# Patient Record
Sex: Female | Born: 1935 | Race: White | Hispanic: No | Marital: Married | Smoking: Current every day smoker
Health system: Southern US, Community
[De-identification: ages and names within clinical notes are randomized; demographics above are authoritative.]

## PROBLEM LIST (undated history)

## (undated) DIAGNOSIS — R7301 Impaired fasting glucose: Secondary | ICD-10-CM

## (undated) DIAGNOSIS — I10 Essential (primary) hypertension: Secondary | ICD-10-CM

## (undated) DIAGNOSIS — Z853 Personal history of malignant neoplasm of breast: Secondary | ICD-10-CM

## (undated) DIAGNOSIS — M810 Age-related osteoporosis without current pathological fracture: Secondary | ICD-10-CM

## (undated) DIAGNOSIS — E782 Mixed hyperlipidemia: Secondary | ICD-10-CM

## (undated) DIAGNOSIS — I6523 Occlusion and stenosis of bilateral carotid arteries: Secondary | ICD-10-CM

## (undated) HISTORY — PX: OTHER SURGICAL HISTORY: SHX169

## (undated) HISTORY — DX: Age-related osteoporosis without current pathological fracture: M81.0

## (undated) HISTORY — DX: Personal history of malignant neoplasm of breast: Z85.3

## (undated) HISTORY — DX: Impaired fasting glucose: R73.01

## (undated) HISTORY — DX: Essential (primary) hypertension: I10

## (undated) HISTORY — DX: Mixed hyperlipidemia: E78.2

## (undated) HISTORY — PX: FEMORAL BYPASS: SHX50

## (undated) HISTORY — DX: Occlusion and stenosis of bilateral carotid arteries: I65.23

## (undated) HISTORY — PX: CAROTID ENDARTERECTOMY: SUR193

## (undated) HISTORY — PX: FEMORAL ARTERY STENT: SHX1583

---

## 2008-04-13 HISTORY — PX: BREAST LUMPECTOMY: SHX2

## 2011-04-16 DIAGNOSIS — J209 Acute bronchitis, unspecified: Secondary | ICD-10-CM | POA: Diagnosis not present

## 2011-05-14 DIAGNOSIS — M899 Disorder of bone, unspecified: Secondary | ICD-10-CM | POA: Diagnosis not present

## 2011-05-14 DIAGNOSIS — Z09 Encounter for follow-up examination after completed treatment for conditions other than malignant neoplasm: Secondary | ICD-10-CM | POA: Diagnosis not present

## 2011-05-14 DIAGNOSIS — Z853 Personal history of malignant neoplasm of breast: Secondary | ICD-10-CM | POA: Diagnosis not present

## 2011-05-21 DIAGNOSIS — R300 Dysuria: Secondary | ICD-10-CM | POA: Diagnosis not present

## 2011-05-21 DIAGNOSIS — B373 Candidiasis of vulva and vagina: Secondary | ICD-10-CM | POA: Diagnosis not present

## 2011-06-08 DIAGNOSIS — C50319 Malignant neoplasm of lower-inner quadrant of unspecified female breast: Secondary | ICD-10-CM | POA: Diagnosis not present

## 2011-07-22 DIAGNOSIS — Z79899 Other long term (current) drug therapy: Secondary | ICD-10-CM | POA: Diagnosis not present

## 2011-07-22 DIAGNOSIS — I10 Essential (primary) hypertension: Secondary | ICD-10-CM | POA: Diagnosis not present

## 2011-07-22 DIAGNOSIS — E7800 Pure hypercholesterolemia, unspecified: Secondary | ICD-10-CM | POA: Diagnosis not present

## 2011-07-22 DIAGNOSIS — R7301 Impaired fasting glucose: Secondary | ICD-10-CM | POA: Diagnosis not present

## 2011-07-22 DIAGNOSIS — E782 Mixed hyperlipidemia: Secondary | ICD-10-CM | POA: Diagnosis not present

## 2011-07-28 DIAGNOSIS — L820 Inflamed seborrheic keratosis: Secondary | ICD-10-CM | POA: Diagnosis not present

## 2011-10-05 DIAGNOSIS — H40019 Open angle with borderline findings, low risk, unspecified eye: Secondary | ICD-10-CM | POA: Diagnosis not present

## 2011-10-05 DIAGNOSIS — H353190 Nonexudative age-related macular degeneration, unspecified eye, stage unspecified: Secondary | ICD-10-CM | POA: Diagnosis not present

## 2011-10-20 DIAGNOSIS — H538 Other visual disturbances: Secondary | ICD-10-CM | POA: Diagnosis not present

## 2011-10-20 DIAGNOSIS — H35059 Retinal neovascularization, unspecified, unspecified eye: Secondary | ICD-10-CM | POA: Diagnosis not present

## 2011-10-23 DIAGNOSIS — H353290 Exudative age-related macular degeneration, unspecified eye, stage unspecified: Secondary | ICD-10-CM | POA: Diagnosis not present

## 2011-11-03 DIAGNOSIS — Z79899 Other long term (current) drug therapy: Secondary | ICD-10-CM | POA: Diagnosis not present

## 2011-11-03 DIAGNOSIS — E782 Mixed hyperlipidemia: Secondary | ICD-10-CM | POA: Diagnosis not present

## 2011-11-03 DIAGNOSIS — I10 Essential (primary) hypertension: Secondary | ICD-10-CM | POA: Diagnosis not present

## 2011-11-03 DIAGNOSIS — R7301 Impaired fasting glucose: Secondary | ICD-10-CM | POA: Diagnosis not present

## 2011-11-03 DIAGNOSIS — M545 Low back pain: Secondary | ICD-10-CM | POA: Diagnosis not present

## 2011-11-24 DIAGNOSIS — I10 Essential (primary) hypertension: Secondary | ICD-10-CM | POA: Diagnosis not present

## 2011-11-24 DIAGNOSIS — H353290 Exudative age-related macular degeneration, unspecified eye, stage unspecified: Secondary | ICD-10-CM | POA: Diagnosis not present

## 2012-01-05 DIAGNOSIS — H353290 Exudative age-related macular degeneration, unspecified eye, stage unspecified: Secondary | ICD-10-CM | POA: Diagnosis not present

## 2012-01-07 DIAGNOSIS — N390 Urinary tract infection, site not specified: Secondary | ICD-10-CM | POA: Diagnosis not present

## 2012-01-07 DIAGNOSIS — N3000 Acute cystitis without hematuria: Secondary | ICD-10-CM | POA: Diagnosis not present

## 2012-01-19 DIAGNOSIS — R21 Rash and other nonspecific skin eruption: Secondary | ICD-10-CM | POA: Diagnosis not present

## 2012-02-01 DIAGNOSIS — M545 Low back pain: Secondary | ICD-10-CM | POA: Diagnosis not present

## 2012-02-01 DIAGNOSIS — R7301 Impaired fasting glucose: Secondary | ICD-10-CM | POA: Diagnosis not present

## 2012-02-01 DIAGNOSIS — E782 Mixed hyperlipidemia: Secondary | ICD-10-CM | POA: Diagnosis not present

## 2012-02-01 DIAGNOSIS — Z23 Encounter for immunization: Secondary | ICD-10-CM | POA: Diagnosis not present

## 2012-02-01 DIAGNOSIS — I10 Essential (primary) hypertension: Secondary | ICD-10-CM | POA: Diagnosis not present

## 2012-02-09 DIAGNOSIS — H353290 Exudative age-related macular degeneration, unspecified eye, stage unspecified: Secondary | ICD-10-CM | POA: Diagnosis not present

## 2012-03-08 DIAGNOSIS — H353290 Exudative age-related macular degeneration, unspecified eye, stage unspecified: Secondary | ICD-10-CM | POA: Diagnosis not present

## 2012-04-01 DIAGNOSIS — N390 Urinary tract infection, site not specified: Secondary | ICD-10-CM | POA: Diagnosis not present

## 2012-04-01 DIAGNOSIS — N3000 Acute cystitis without hematuria: Secondary | ICD-10-CM | POA: Diagnosis not present

## 2012-04-08 DIAGNOSIS — R21 Rash and other nonspecific skin eruption: Secondary | ICD-10-CM | POA: Diagnosis not present

## 2012-04-11 DIAGNOSIS — R21 Rash and other nonspecific skin eruption: Secondary | ICD-10-CM | POA: Diagnosis not present

## 2012-04-12 DIAGNOSIS — H353290 Exudative age-related macular degeneration, unspecified eye, stage unspecified: Secondary | ICD-10-CM | POA: Diagnosis not present

## 2012-05-24 DIAGNOSIS — H353290 Exudative age-related macular degeneration, unspecified eye, stage unspecified: Secondary | ICD-10-CM | POA: Diagnosis not present

## 2012-05-24 DIAGNOSIS — N3000 Acute cystitis without hematuria: Secondary | ICD-10-CM | POA: Diagnosis not present

## 2012-06-13 DIAGNOSIS — C50919 Malignant neoplasm of unspecified site of unspecified female breast: Secondary | ICD-10-CM | POA: Diagnosis not present

## 2012-06-13 DIAGNOSIS — Z9889 Other specified postprocedural states: Secondary | ICD-10-CM | POA: Diagnosis not present

## 2012-06-15 DIAGNOSIS — Z09 Encounter for follow-up examination after completed treatment for conditions other than malignant neoplasm: Secondary | ICD-10-CM | POA: Diagnosis not present

## 2012-06-15 DIAGNOSIS — M899 Disorder of bone, unspecified: Secondary | ICD-10-CM | POA: Diagnosis not present

## 2012-06-15 DIAGNOSIS — Z853 Personal history of malignant neoplasm of breast: Secondary | ICD-10-CM | POA: Diagnosis not present

## 2012-06-21 DIAGNOSIS — L299 Pruritus, unspecified: Secondary | ICD-10-CM | POA: Diagnosis not present

## 2012-06-21 DIAGNOSIS — N3000 Acute cystitis without hematuria: Secondary | ICD-10-CM | POA: Diagnosis not present

## 2012-07-05 DIAGNOSIS — H353290 Exudative age-related macular degeneration, unspecified eye, stage unspecified: Secondary | ICD-10-CM | POA: Diagnosis not present

## 2012-08-03 DIAGNOSIS — H353290 Exudative age-related macular degeneration, unspecified eye, stage unspecified: Secondary | ICD-10-CM | POA: Diagnosis not present

## 2012-08-30 DIAGNOSIS — M25579 Pain in unspecified ankle and joints of unspecified foot: Secondary | ICD-10-CM | POA: Diagnosis not present

## 2012-08-30 DIAGNOSIS — M159 Polyosteoarthritis, unspecified: Secondary | ICD-10-CM | POA: Diagnosis not present

## 2012-09-06 DIAGNOSIS — H353290 Exudative age-related macular degeneration, unspecified eye, stage unspecified: Secondary | ICD-10-CM | POA: Diagnosis not present

## 2012-09-19 DIAGNOSIS — M5030 Other cervical disc degeneration, unspecified cervical region: Secondary | ICD-10-CM | POA: Diagnosis not present

## 2012-09-19 DIAGNOSIS — M999 Biomechanical lesion, unspecified: Secondary | ICD-10-CM | POA: Diagnosis not present

## 2012-09-19 DIAGNOSIS — M5137 Other intervertebral disc degeneration, lumbosacral region: Secondary | ICD-10-CM | POA: Diagnosis not present

## 2012-09-21 DIAGNOSIS — M5030 Other cervical disc degeneration, unspecified cervical region: Secondary | ICD-10-CM | POA: Diagnosis not present

## 2012-09-21 DIAGNOSIS — M5137 Other intervertebral disc degeneration, lumbosacral region: Secondary | ICD-10-CM | POA: Diagnosis not present

## 2012-09-21 DIAGNOSIS — M999 Biomechanical lesion, unspecified: Secondary | ICD-10-CM | POA: Diagnosis not present

## 2012-09-23 DIAGNOSIS — M5030 Other cervical disc degeneration, unspecified cervical region: Secondary | ICD-10-CM | POA: Diagnosis not present

## 2012-09-23 DIAGNOSIS — M5137 Other intervertebral disc degeneration, lumbosacral region: Secondary | ICD-10-CM | POA: Diagnosis not present

## 2012-09-23 DIAGNOSIS — M999 Biomechanical lesion, unspecified: Secondary | ICD-10-CM | POA: Diagnosis not present

## 2012-09-26 DIAGNOSIS — M5030 Other cervical disc degeneration, unspecified cervical region: Secondary | ICD-10-CM | POA: Diagnosis not present

## 2012-09-26 DIAGNOSIS — M5137 Other intervertebral disc degeneration, lumbosacral region: Secondary | ICD-10-CM | POA: Diagnosis not present

## 2012-09-26 DIAGNOSIS — M999 Biomechanical lesion, unspecified: Secondary | ICD-10-CM | POA: Diagnosis not present

## 2012-09-29 DIAGNOSIS — M5137 Other intervertebral disc degeneration, lumbosacral region: Secondary | ICD-10-CM | POA: Diagnosis not present

## 2012-09-29 DIAGNOSIS — M999 Biomechanical lesion, unspecified: Secondary | ICD-10-CM | POA: Diagnosis not present

## 2012-09-29 DIAGNOSIS — M5030 Other cervical disc degeneration, unspecified cervical region: Secondary | ICD-10-CM | POA: Diagnosis not present

## 2012-10-03 DIAGNOSIS — M999 Biomechanical lesion, unspecified: Secondary | ICD-10-CM | POA: Diagnosis not present

## 2012-10-03 DIAGNOSIS — M5030 Other cervical disc degeneration, unspecified cervical region: Secondary | ICD-10-CM | POA: Diagnosis not present

## 2012-10-03 DIAGNOSIS — M5137 Other intervertebral disc degeneration, lumbosacral region: Secondary | ICD-10-CM | POA: Diagnosis not present

## 2012-10-04 DIAGNOSIS — H353290 Exudative age-related macular degeneration, unspecified eye, stage unspecified: Secondary | ICD-10-CM | POA: Diagnosis not present

## 2012-10-06 DIAGNOSIS — M5030 Other cervical disc degeneration, unspecified cervical region: Secondary | ICD-10-CM | POA: Diagnosis not present

## 2012-10-06 DIAGNOSIS — M999 Biomechanical lesion, unspecified: Secondary | ICD-10-CM | POA: Diagnosis not present

## 2012-10-06 DIAGNOSIS — M5137 Other intervertebral disc degeneration, lumbosacral region: Secondary | ICD-10-CM | POA: Diagnosis not present

## 2012-10-10 DIAGNOSIS — M5030 Other cervical disc degeneration, unspecified cervical region: Secondary | ICD-10-CM | POA: Diagnosis not present

## 2012-10-10 DIAGNOSIS — M999 Biomechanical lesion, unspecified: Secondary | ICD-10-CM | POA: Diagnosis not present

## 2012-10-10 DIAGNOSIS — M5137 Other intervertebral disc degeneration, lumbosacral region: Secondary | ICD-10-CM | POA: Diagnosis not present

## 2012-10-13 DIAGNOSIS — M5137 Other intervertebral disc degeneration, lumbosacral region: Secondary | ICD-10-CM | POA: Diagnosis not present

## 2012-10-13 DIAGNOSIS — M999 Biomechanical lesion, unspecified: Secondary | ICD-10-CM | POA: Diagnosis not present

## 2012-10-13 DIAGNOSIS — M5030 Other cervical disc degeneration, unspecified cervical region: Secondary | ICD-10-CM | POA: Diagnosis not present

## 2012-10-20 DIAGNOSIS — M5030 Other cervical disc degeneration, unspecified cervical region: Secondary | ICD-10-CM | POA: Diagnosis not present

## 2012-10-20 DIAGNOSIS — M5137 Other intervertebral disc degeneration, lumbosacral region: Secondary | ICD-10-CM | POA: Diagnosis not present

## 2012-10-20 DIAGNOSIS — M999 Biomechanical lesion, unspecified: Secondary | ICD-10-CM | POA: Diagnosis not present

## 2012-10-25 DIAGNOSIS — H353290 Exudative age-related macular degeneration, unspecified eye, stage unspecified: Secondary | ICD-10-CM | POA: Diagnosis not present

## 2012-11-18 DIAGNOSIS — N3000 Acute cystitis without hematuria: Secondary | ICD-10-CM | POA: Diagnosis not present

## 2012-12-06 DIAGNOSIS — H353290 Exudative age-related macular degeneration, unspecified eye, stage unspecified: Secondary | ICD-10-CM | POA: Diagnosis not present

## 2012-12-16 DIAGNOSIS — H353290 Exudative age-related macular degeneration, unspecified eye, stage unspecified: Secondary | ICD-10-CM | POA: Diagnosis not present

## 2013-01-02 DIAGNOSIS — H353190 Nonexudative age-related macular degeneration, unspecified eye, stage unspecified: Secondary | ICD-10-CM | POA: Diagnosis not present

## 2013-01-03 DIAGNOSIS — H353290 Exudative age-related macular degeneration, unspecified eye, stage unspecified: Secondary | ICD-10-CM | POA: Diagnosis not present

## 2013-01-24 DIAGNOSIS — H353290 Exudative age-related macular degeneration, unspecified eye, stage unspecified: Secondary | ICD-10-CM | POA: Diagnosis not present

## 2013-01-24 DIAGNOSIS — Z23 Encounter for immunization: Secondary | ICD-10-CM | POA: Diagnosis not present

## 2013-02-21 DIAGNOSIS — H353190 Nonexudative age-related macular degeneration, unspecified eye, stage unspecified: Secondary | ICD-10-CM | POA: Diagnosis not present

## 2013-02-21 DIAGNOSIS — H353290 Exudative age-related macular degeneration, unspecified eye, stage unspecified: Secondary | ICD-10-CM | POA: Diagnosis not present

## 2013-03-27 DIAGNOSIS — H353290 Exudative age-related macular degeneration, unspecified eye, stage unspecified: Secondary | ICD-10-CM | POA: Diagnosis not present

## 2013-03-31 DIAGNOSIS — R7309 Other abnormal glucose: Secondary | ICD-10-CM | POA: Diagnosis not present

## 2013-03-31 DIAGNOSIS — I10 Essential (primary) hypertension: Secondary | ICD-10-CM | POA: Diagnosis not present

## 2013-03-31 DIAGNOSIS — E782 Mixed hyperlipidemia: Secondary | ICD-10-CM | POA: Diagnosis not present

## 2013-03-31 DIAGNOSIS — R7301 Impaired fasting glucose: Secondary | ICD-10-CM | POA: Diagnosis not present

## 2013-04-18 DIAGNOSIS — R42 Dizziness and giddiness: Secondary | ICD-10-CM | POA: Diagnosis not present

## 2013-04-18 DIAGNOSIS — E782 Mixed hyperlipidemia: Secondary | ICD-10-CM | POA: Diagnosis not present

## 2013-04-18 DIAGNOSIS — IMO0001 Reserved for inherently not codable concepts without codable children: Secondary | ICD-10-CM | POA: Diagnosis not present

## 2013-04-18 DIAGNOSIS — I10 Essential (primary) hypertension: Secondary | ICD-10-CM | POA: Diagnosis not present

## 2013-04-18 DIAGNOSIS — R0989 Other specified symptoms and signs involving the circulatory and respiratory systems: Secondary | ICD-10-CM | POA: Diagnosis not present

## 2013-04-18 DIAGNOSIS — I739 Peripheral vascular disease, unspecified: Secondary | ICD-10-CM | POA: Diagnosis not present

## 2013-04-20 DIAGNOSIS — I7092 Chronic total occlusion of artery of the extremities: Secondary | ICD-10-CM | POA: Diagnosis not present

## 2013-04-20 DIAGNOSIS — R42 Dizziness and giddiness: Secondary | ICD-10-CM | POA: Diagnosis not present

## 2013-04-20 DIAGNOSIS — I658 Occlusion and stenosis of other precerebral arteries: Secondary | ICD-10-CM | POA: Diagnosis not present

## 2013-04-20 DIAGNOSIS — I6529 Occlusion and stenosis of unspecified carotid artery: Secondary | ICD-10-CM | POA: Diagnosis not present

## 2013-04-20 DIAGNOSIS — I77811 Abdominal aortic ectasia: Secondary | ICD-10-CM | POA: Diagnosis not present

## 2013-04-20 DIAGNOSIS — I739 Peripheral vascular disease, unspecified: Secondary | ICD-10-CM | POA: Diagnosis not present

## 2013-04-24 DIAGNOSIS — I70219 Atherosclerosis of native arteries of extremities with intermittent claudication, unspecified extremity: Secondary | ICD-10-CM | POA: Diagnosis not present

## 2013-04-24 DIAGNOSIS — I6529 Occlusion and stenosis of unspecified carotid artery: Secondary | ICD-10-CM | POA: Diagnosis not present

## 2013-04-24 DIAGNOSIS — I658 Occlusion and stenosis of other precerebral arteries: Secondary | ICD-10-CM | POA: Diagnosis not present

## 2013-04-26 DIAGNOSIS — I70219 Atherosclerosis of native arteries of extremities with intermittent claudication, unspecified extremity: Secondary | ICD-10-CM | POA: Diagnosis not present

## 2013-04-27 DIAGNOSIS — I6529 Occlusion and stenosis of unspecified carotid artery: Secondary | ICD-10-CM | POA: Diagnosis not present

## 2013-04-27 DIAGNOSIS — M79609 Pain in unspecified limb: Secondary | ICD-10-CM | POA: Diagnosis not present

## 2013-04-27 DIAGNOSIS — I70219 Atherosclerosis of native arteries of extremities with intermittent claudication, unspecified extremity: Secondary | ICD-10-CM | POA: Diagnosis not present

## 2013-05-09 DIAGNOSIS — H353290 Exudative age-related macular degeneration, unspecified eye, stage unspecified: Secondary | ICD-10-CM | POA: Diagnosis not present

## 2013-05-11 DIAGNOSIS — R51 Headache: Secondary | ICD-10-CM | POA: Diagnosis not present

## 2013-05-11 DIAGNOSIS — I70219 Atherosclerosis of native arteries of extremities with intermittent claudication, unspecified extremity: Secondary | ICD-10-CM | POA: Diagnosis not present

## 2013-05-11 DIAGNOSIS — R42 Dizziness and giddiness: Secondary | ICD-10-CM | POA: Diagnosis not present

## 2013-05-11 DIAGNOSIS — E782 Mixed hyperlipidemia: Secondary | ICD-10-CM | POA: Diagnosis not present

## 2013-05-11 DIAGNOSIS — R0989 Other specified symptoms and signs involving the circulatory and respiratory systems: Secondary | ICD-10-CM | POA: Diagnosis not present

## 2013-05-18 DIAGNOSIS — Z1211 Encounter for screening for malignant neoplasm of colon: Secondary | ICD-10-CM | POA: Diagnosis not present

## 2013-05-18 DIAGNOSIS — N390 Urinary tract infection, site not specified: Secondary | ICD-10-CM | POA: Diagnosis not present

## 2013-05-18 DIAGNOSIS — Z0000 Encounter for general adult medical examination without abnormal findings: Secondary | ICD-10-CM | POA: Diagnosis not present

## 2013-05-18 DIAGNOSIS — F17200 Nicotine dependence, unspecified, uncomplicated: Secondary | ICD-10-CM | POA: Diagnosis not present

## 2013-05-19 DIAGNOSIS — R42 Dizziness and giddiness: Secondary | ICD-10-CM | POA: Diagnosis not present

## 2013-05-19 DIAGNOSIS — R51 Headache: Secondary | ICD-10-CM | POA: Diagnosis not present

## 2013-05-19 DIAGNOSIS — R930 Abnormal findings on diagnostic imaging of skull and head, not elsewhere classified: Secondary | ICD-10-CM | POA: Diagnosis not present

## 2013-05-23 DIAGNOSIS — Z1211 Encounter for screening for malignant neoplasm of colon: Secondary | ICD-10-CM | POA: Diagnosis not present

## 2013-05-24 DIAGNOSIS — Z13820 Encounter for screening for osteoporosis: Secondary | ICD-10-CM | POA: Diagnosis not present

## 2013-05-24 DIAGNOSIS — M810 Age-related osteoporosis without current pathological fracture: Secondary | ICD-10-CM | POA: Diagnosis not present

## 2013-05-25 DIAGNOSIS — I6529 Occlusion and stenosis of unspecified carotid artery: Secondary | ICD-10-CM | POA: Diagnosis not present

## 2013-05-25 DIAGNOSIS — I658 Occlusion and stenosis of other precerebral arteries: Secondary | ICD-10-CM | POA: Diagnosis not present

## 2013-06-20 DIAGNOSIS — H353290 Exudative age-related macular degeneration, unspecified eye, stage unspecified: Secondary | ICD-10-CM | POA: Diagnosis not present

## 2013-06-29 DIAGNOSIS — M545 Low back pain, unspecified: Secondary | ICD-10-CM | POA: Diagnosis not present

## 2013-07-25 DIAGNOSIS — H353290 Exudative age-related macular degeneration, unspecified eye, stage unspecified: Secondary | ICD-10-CM | POA: Diagnosis not present

## 2013-08-14 DIAGNOSIS — Z9889 Other specified postprocedural states: Secondary | ICD-10-CM | POA: Diagnosis not present

## 2013-08-14 DIAGNOSIS — N641 Fat necrosis of breast: Secondary | ICD-10-CM | POA: Diagnosis not present

## 2013-08-14 DIAGNOSIS — R922 Inconclusive mammogram: Secondary | ICD-10-CM | POA: Diagnosis not present

## 2013-08-14 DIAGNOSIS — C50319 Malignant neoplasm of lower-inner quadrant of unspecified female breast: Secondary | ICD-10-CM | POA: Diagnosis not present

## 2013-08-17 DIAGNOSIS — N63 Unspecified lump in unspecified breast: Secondary | ICD-10-CM | POA: Diagnosis not present

## 2013-08-17 DIAGNOSIS — Z09 Encounter for follow-up examination after completed treatment for conditions other than malignant neoplasm: Secondary | ICD-10-CM | POA: Diagnosis not present

## 2013-08-17 DIAGNOSIS — Z853 Personal history of malignant neoplasm of breast: Secondary | ICD-10-CM | POA: Diagnosis not present

## 2013-08-17 DIAGNOSIS — M899 Disorder of bone, unspecified: Secondary | ICD-10-CM | POA: Diagnosis not present

## 2013-08-17 DIAGNOSIS — M949 Disorder of cartilage, unspecified: Secondary | ICD-10-CM | POA: Diagnosis not present

## 2013-09-05 DIAGNOSIS — H353290 Exudative age-related macular degeneration, unspecified eye, stage unspecified: Secondary | ICD-10-CM | POA: Diagnosis not present

## 2013-10-02 DIAGNOSIS — R42 Dizziness and giddiness: Secondary | ICD-10-CM | POA: Diagnosis not present

## 2013-10-02 DIAGNOSIS — E782 Mixed hyperlipidemia: Secondary | ICD-10-CM | POA: Diagnosis not present

## 2013-10-02 DIAGNOSIS — R7301 Impaired fasting glucose: Secondary | ICD-10-CM | POA: Diagnosis not present

## 2013-10-02 DIAGNOSIS — R7309 Other abnormal glucose: Secondary | ICD-10-CM | POA: Diagnosis not present

## 2013-10-02 DIAGNOSIS — I10 Essential (primary) hypertension: Secondary | ICD-10-CM | POA: Diagnosis not present

## 2013-10-12 DIAGNOSIS — H353290 Exudative age-related macular degeneration, unspecified eye, stage unspecified: Secondary | ICD-10-CM | POA: Diagnosis not present

## 2013-10-12 DIAGNOSIS — J342 Deviated nasal septum: Secondary | ICD-10-CM | POA: Diagnosis not present

## 2013-10-12 DIAGNOSIS — R42 Dizziness and giddiness: Secondary | ICD-10-CM | POA: Diagnosis not present

## 2013-10-12 DIAGNOSIS — R269 Unspecified abnormalities of gait and mobility: Secondary | ICD-10-CM | POA: Diagnosis not present

## 2013-11-07 DIAGNOSIS — I70219 Atherosclerosis of native arteries of extremities with intermittent claudication, unspecified extremity: Secondary | ICD-10-CM | POA: Diagnosis not present

## 2013-11-07 DIAGNOSIS — I658 Occlusion and stenosis of other precerebral arteries: Secondary | ICD-10-CM | POA: Diagnosis not present

## 2013-11-07 DIAGNOSIS — I739 Peripheral vascular disease, unspecified: Secondary | ICD-10-CM | POA: Diagnosis not present

## 2013-11-07 DIAGNOSIS — I6529 Occlusion and stenosis of unspecified carotid artery: Secondary | ICD-10-CM | POA: Diagnosis not present

## 2013-11-09 DIAGNOSIS — J342 Deviated nasal septum: Secondary | ICD-10-CM | POA: Diagnosis not present

## 2013-11-09 DIAGNOSIS — Z0110 Encounter for examination of ears and hearing without abnormal findings: Secondary | ICD-10-CM | POA: Diagnosis not present

## 2013-11-09 DIAGNOSIS — H8110 Benign paroxysmal vertigo, unspecified ear: Secondary | ICD-10-CM | POA: Diagnosis not present

## 2013-11-21 DIAGNOSIS — H8110 Benign paroxysmal vertigo, unspecified ear: Secondary | ICD-10-CM | POA: Diagnosis not present

## 2013-11-23 DIAGNOSIS — H353290 Exudative age-related macular degeneration, unspecified eye, stage unspecified: Secondary | ICD-10-CM | POA: Diagnosis not present

## 2013-12-28 DIAGNOSIS — H353290 Exudative age-related macular degeneration, unspecified eye, stage unspecified: Secondary | ICD-10-CM | POA: Diagnosis not present

## 2014-01-23 DIAGNOSIS — Z23 Encounter for immunization: Secondary | ICD-10-CM | POA: Diagnosis not present

## 2014-01-25 DIAGNOSIS — H3532 Exudative age-related macular degeneration: Secondary | ICD-10-CM | POA: Diagnosis not present

## 2014-02-15 DIAGNOSIS — N63 Unspecified lump in breast: Secondary | ICD-10-CM | POA: Diagnosis not present

## 2014-02-15 DIAGNOSIS — R922 Inconclusive mammogram: Secondary | ICD-10-CM | POA: Diagnosis not present

## 2014-02-22 DIAGNOSIS — Z0000 Encounter for general adult medical examination without abnormal findings: Secondary | ICD-10-CM | POA: Diagnosis not present

## 2014-03-01 DIAGNOSIS — N183 Chronic kidney disease, stage 3 (moderate): Secondary | ICD-10-CM | POA: Diagnosis not present

## 2014-03-01 DIAGNOSIS — I6529 Occlusion and stenosis of unspecified carotid artery: Secondary | ICD-10-CM | POA: Diagnosis not present

## 2014-03-01 DIAGNOSIS — Z01818 Encounter for other preprocedural examination: Secondary | ICD-10-CM | POA: Diagnosis not present

## 2014-03-05 DIAGNOSIS — N3001 Acute cystitis with hematuria: Secondary | ICD-10-CM | POA: Diagnosis not present

## 2014-03-05 DIAGNOSIS — N390 Urinary tract infection, site not specified: Secondary | ICD-10-CM | POA: Diagnosis not present

## 2014-03-07 DIAGNOSIS — Z01810 Encounter for preprocedural cardiovascular examination: Secondary | ICD-10-CM | POA: Diagnosis not present

## 2014-03-07 DIAGNOSIS — I6523 Occlusion and stenosis of bilateral carotid arteries: Secondary | ICD-10-CM | POA: Diagnosis not present

## 2014-03-14 DIAGNOSIS — D72829 Elevated white blood cell count, unspecified: Secondary | ICD-10-CM | POA: Diagnosis not present

## 2014-03-14 DIAGNOSIS — E785 Hyperlipidemia, unspecified: Secondary | ICD-10-CM | POA: Diagnosis not present

## 2014-03-14 DIAGNOSIS — Z87891 Personal history of nicotine dependence: Secondary | ICD-10-CM | POA: Diagnosis not present

## 2014-03-14 DIAGNOSIS — N183 Chronic kidney disease, stage 3 (moderate): Secondary | ICD-10-CM | POA: Diagnosis not present

## 2014-03-14 DIAGNOSIS — I6522 Occlusion and stenosis of left carotid artery: Secondary | ICD-10-CM | POA: Diagnosis not present

## 2014-03-14 DIAGNOSIS — E876 Hypokalemia: Secondary | ICD-10-CM | POA: Diagnosis not present

## 2014-03-14 DIAGNOSIS — I129 Hypertensive chronic kidney disease with stage 1 through stage 4 chronic kidney disease, or unspecified chronic kidney disease: Secondary | ICD-10-CM | POA: Diagnosis not present

## 2014-03-14 DIAGNOSIS — Z01818 Encounter for other preprocedural examination: Secondary | ICD-10-CM | POA: Diagnosis not present

## 2014-03-19 DIAGNOSIS — I63239 Cerebral infarction due to unspecified occlusion or stenosis of unspecified carotid arteries: Secondary | ICD-10-CM | POA: Diagnosis not present

## 2014-03-19 DIAGNOSIS — R42 Dizziness and giddiness: Secondary | ICD-10-CM | POA: Diagnosis not present

## 2014-03-19 DIAGNOSIS — I6523 Occlusion and stenosis of bilateral carotid arteries: Secondary | ICD-10-CM | POA: Diagnosis not present

## 2014-03-19 DIAGNOSIS — Z01810 Encounter for preprocedural cardiovascular examination: Secondary | ICD-10-CM | POA: Diagnosis not present

## 2014-03-20 DIAGNOSIS — I129 Hypertensive chronic kidney disease with stage 1 through stage 4 chronic kidney disease, or unspecified chronic kidney disease: Secondary | ICD-10-CM | POA: Diagnosis present

## 2014-03-20 DIAGNOSIS — I6529 Occlusion and stenosis of unspecified carotid artery: Secondary | ICD-10-CM | POA: Diagnosis not present

## 2014-03-20 DIAGNOSIS — I6522 Occlusion and stenosis of left carotid artery: Secondary | ICD-10-CM | POA: Diagnosis not present

## 2014-03-20 DIAGNOSIS — Z87891 Personal history of nicotine dependence: Secondary | ICD-10-CM | POA: Diagnosis not present

## 2014-03-20 DIAGNOSIS — D72829 Elevated white blood cell count, unspecified: Secondary | ICD-10-CM | POA: Diagnosis present

## 2014-03-20 DIAGNOSIS — J90 Pleural effusion, not elsewhere classified: Secondary | ICD-10-CM | POA: Diagnosis not present

## 2014-03-20 DIAGNOSIS — I659 Occlusion and stenosis of unspecified precerebral artery: Secondary | ICD-10-CM | POA: Diagnosis not present

## 2014-03-20 DIAGNOSIS — N183 Chronic kidney disease, stage 3 (moderate): Secondary | ICD-10-CM | POA: Diagnosis present

## 2014-03-20 DIAGNOSIS — E785 Hyperlipidemia, unspecified: Secondary | ICD-10-CM | POA: Diagnosis present

## 2014-03-20 DIAGNOSIS — E876 Hypokalemia: Secondary | ICD-10-CM | POA: Diagnosis present

## 2014-03-30 DIAGNOSIS — H3532 Exudative age-related macular degeneration: Secondary | ICD-10-CM | POA: Diagnosis not present

## 2014-04-04 DIAGNOSIS — I6522 Occlusion and stenosis of left carotid artery: Secondary | ICD-10-CM | POA: Diagnosis not present

## 2014-04-04 DIAGNOSIS — I10 Essential (primary) hypertension: Secondary | ICD-10-CM | POA: Diagnosis not present

## 2014-04-10 DIAGNOSIS — R7301 Impaired fasting glucose: Secondary | ICD-10-CM | POA: Diagnosis not present

## 2014-04-10 DIAGNOSIS — E782 Mixed hyperlipidemia: Secondary | ICD-10-CM | POA: Diagnosis not present

## 2014-04-10 DIAGNOSIS — E780 Pure hypercholesterolemia: Secondary | ICD-10-CM | POA: Diagnosis not present

## 2014-04-10 DIAGNOSIS — I10 Essential (primary) hypertension: Secondary | ICD-10-CM | POA: Diagnosis not present

## 2014-04-10 DIAGNOSIS — I6522 Occlusion and stenosis of left carotid artery: Secondary | ICD-10-CM | POA: Diagnosis not present

## 2014-04-13 HISTORY — PX: CATARACT EXTRACTION: SUR2

## 2014-04-27 DIAGNOSIS — I6522 Occlusion and stenosis of left carotid artery: Secondary | ICD-10-CM | POA: Diagnosis not present

## 2014-04-27 DIAGNOSIS — I10 Essential (primary) hypertension: Secondary | ICD-10-CM | POA: Diagnosis not present

## 2014-04-27 DIAGNOSIS — Z4802 Encounter for removal of sutures: Secondary | ICD-10-CM | POA: Diagnosis not present

## 2014-05-10 DIAGNOSIS — I6523 Occlusion and stenosis of bilateral carotid arteries: Secondary | ICD-10-CM | POA: Diagnosis not present

## 2014-05-17 DIAGNOSIS — H3532 Exudative age-related macular degeneration: Secondary | ICD-10-CM | POA: Diagnosis not present

## 2014-06-28 DIAGNOSIS — H3532 Exudative age-related macular degeneration: Secondary | ICD-10-CM | POA: Diagnosis not present

## 2014-07-17 DIAGNOSIS — H40013 Open angle with borderline findings, low risk, bilateral: Secondary | ICD-10-CM | POA: Diagnosis not present

## 2014-07-17 DIAGNOSIS — H25811 Combined forms of age-related cataract, right eye: Secondary | ICD-10-CM | POA: Diagnosis not present

## 2014-07-23 DIAGNOSIS — H2511 Age-related nuclear cataract, right eye: Secondary | ICD-10-CM | POA: Diagnosis not present

## 2014-07-23 DIAGNOSIS — H25811 Combined forms of age-related cataract, right eye: Secondary | ICD-10-CM | POA: Diagnosis not present

## 2014-08-16 DIAGNOSIS — H3532 Exudative age-related macular degeneration: Secondary | ICD-10-CM | POA: Diagnosis not present

## 2014-08-20 DIAGNOSIS — N63 Unspecified lump in breast: Secondary | ICD-10-CM | POA: Diagnosis not present

## 2014-08-20 DIAGNOSIS — R928 Other abnormal and inconclusive findings on diagnostic imaging of breast: Secondary | ICD-10-CM | POA: Diagnosis not present

## 2014-08-24 DIAGNOSIS — E876 Hypokalemia: Secondary | ICD-10-CM | POA: Diagnosis not present

## 2014-08-24 DIAGNOSIS — Z853 Personal history of malignant neoplasm of breast: Secondary | ICD-10-CM | POA: Diagnosis not present

## 2014-08-24 DIAGNOSIS — M8589 Other specified disorders of bone density and structure, multiple sites: Secondary | ICD-10-CM | POA: Diagnosis not present

## 2014-09-20 DIAGNOSIS — H3532 Exudative age-related macular degeneration: Secondary | ICD-10-CM | POA: Diagnosis not present

## 2014-10-22 DIAGNOSIS — Z23 Encounter for immunization: Secondary | ICD-10-CM | POA: Diagnosis not present

## 2014-10-22 DIAGNOSIS — D171 Benign lipomatous neoplasm of skin and subcutaneous tissue of trunk: Secondary | ICD-10-CM | POA: Diagnosis not present

## 2014-10-22 DIAGNOSIS — R7301 Impaired fasting glucose: Secondary | ICD-10-CM | POA: Diagnosis not present

## 2014-10-22 DIAGNOSIS — I6522 Occlusion and stenosis of left carotid artery: Secondary | ICD-10-CM | POA: Diagnosis not present

## 2014-10-22 DIAGNOSIS — M818 Other osteoporosis without current pathological fracture: Secondary | ICD-10-CM | POA: Diagnosis not present

## 2014-10-22 DIAGNOSIS — I10 Essential (primary) hypertension: Secondary | ICD-10-CM | POA: Diagnosis not present

## 2014-10-22 DIAGNOSIS — E782 Mixed hyperlipidemia: Secondary | ICD-10-CM | POA: Diagnosis not present

## 2014-10-22 DIAGNOSIS — F17210 Nicotine dependence, cigarettes, uncomplicated: Secondary | ICD-10-CM | POA: Diagnosis not present

## 2014-11-06 DIAGNOSIS — H3532 Exudative age-related macular degeneration: Secondary | ICD-10-CM | POA: Diagnosis not present

## 2014-11-13 DIAGNOSIS — H3532 Exudative age-related macular degeneration: Secondary | ICD-10-CM | POA: Diagnosis not present

## 2014-11-22 DIAGNOSIS — M810 Age-related osteoporosis without current pathological fracture: Secondary | ICD-10-CM | POA: Diagnosis not present

## 2014-11-22 DIAGNOSIS — Z0000 Encounter for general adult medical examination without abnormal findings: Secondary | ICD-10-CM | POA: Diagnosis not present

## 2014-11-22 DIAGNOSIS — F17210 Nicotine dependence, cigarettes, uncomplicated: Secondary | ICD-10-CM | POA: Diagnosis not present

## 2014-11-22 DIAGNOSIS — Z1211 Encounter for screening for malignant neoplasm of colon: Secondary | ICD-10-CM | POA: Diagnosis not present

## 2014-11-22 DIAGNOSIS — Z6820 Body mass index (BMI) 20.0-20.9, adult: Secondary | ICD-10-CM | POA: Diagnosis not present

## 2014-12-04 DIAGNOSIS — Z1211 Encounter for screening for malignant neoplasm of colon: Secondary | ICD-10-CM | POA: Diagnosis not present

## 2014-12-20 DIAGNOSIS — H3532 Exudative age-related macular degeneration: Secondary | ICD-10-CM | POA: Diagnosis not present

## 2015-02-01 DIAGNOSIS — I6523 Occlusion and stenosis of bilateral carotid arteries: Secondary | ICD-10-CM | POA: Diagnosis not present

## 2015-02-01 DIAGNOSIS — I70211 Atherosclerosis of native arteries of extremities with intermittent claudication, right leg: Secondary | ICD-10-CM | POA: Diagnosis not present

## 2015-02-01 DIAGNOSIS — I70202 Unspecified atherosclerosis of native arteries of extremities, left leg: Secondary | ICD-10-CM | POA: Diagnosis not present

## 2015-02-07 DIAGNOSIS — H353231 Exudative age-related macular degeneration, bilateral, with active choroidal neovascularization: Secondary | ICD-10-CM | POA: Diagnosis not present

## 2015-02-11 DIAGNOSIS — I6523 Occlusion and stenosis of bilateral carotid arteries: Secondary | ICD-10-CM | POA: Diagnosis not present

## 2015-02-11 DIAGNOSIS — I70213 Atherosclerosis of native arteries of extremities with intermittent claudication, bilateral legs: Secondary | ICD-10-CM | POA: Diagnosis not present

## 2015-02-12 DIAGNOSIS — Z23 Encounter for immunization: Secondary | ICD-10-CM | POA: Diagnosis not present

## 2015-02-19 DIAGNOSIS — J209 Acute bronchitis, unspecified: Secondary | ICD-10-CM | POA: Diagnosis not present

## 2015-03-01 DIAGNOSIS — J209 Acute bronchitis, unspecified: Secondary | ICD-10-CM | POA: Diagnosis not present

## 2015-03-01 DIAGNOSIS — H6123 Impacted cerumen, bilateral: Secondary | ICD-10-CM | POA: Diagnosis not present

## 2015-03-21 DIAGNOSIS — H353211 Exudative age-related macular degeneration, right eye, with active choroidal neovascularization: Secondary | ICD-10-CM | POA: Diagnosis not present

## 2015-03-21 DIAGNOSIS — H353231 Exudative age-related macular degeneration, bilateral, with active choroidal neovascularization: Secondary | ICD-10-CM | POA: Diagnosis not present

## 2015-04-01 DIAGNOSIS — N183 Chronic kidney disease, stage 3 (moderate): Secondary | ICD-10-CM | POA: Diagnosis not present

## 2015-04-01 DIAGNOSIS — I70213 Atherosclerosis of native arteries of extremities with intermittent claudication, bilateral legs: Secondary | ICD-10-CM | POA: Diagnosis not present

## 2015-04-05 DIAGNOSIS — I70219 Atherosclerosis of native arteries of extremities with intermittent claudication, unspecified extremity: Secondary | ICD-10-CM | POA: Diagnosis not present

## 2015-04-05 DIAGNOSIS — I7090 Unspecified atherosclerosis: Secondary | ICD-10-CM | POA: Insufficient documentation

## 2015-04-10 DIAGNOSIS — N183 Chronic kidney disease, stage 3 (moderate): Secondary | ICD-10-CM | POA: Diagnosis present

## 2015-04-10 DIAGNOSIS — Z7982 Long term (current) use of aspirin: Secondary | ICD-10-CM | POA: Diagnosis not present

## 2015-04-10 DIAGNOSIS — I129 Hypertensive chronic kidney disease with stage 1 through stage 4 chronic kidney disease, or unspecified chronic kidney disease: Secondary | ICD-10-CM | POA: Diagnosis present

## 2015-04-10 DIAGNOSIS — I70221 Atherosclerosis of native arteries of extremities with rest pain, right leg: Secondary | ICD-10-CM | POA: Diagnosis not present

## 2015-04-10 DIAGNOSIS — I739 Peripheral vascular disease, unspecified: Secondary | ICD-10-CM | POA: Diagnosis not present

## 2015-04-10 DIAGNOSIS — F17210 Nicotine dependence, cigarettes, uncomplicated: Secondary | ICD-10-CM | POA: Diagnosis present

## 2015-04-10 DIAGNOSIS — Z7902 Long term (current) use of antithrombotics/antiplatelets: Secondary | ICD-10-CM | POA: Diagnosis not present

## 2015-04-11 DIAGNOSIS — F17210 Nicotine dependence, cigarettes, uncomplicated: Secondary | ICD-10-CM | POA: Diagnosis not present

## 2015-04-11 DIAGNOSIS — I129 Hypertensive chronic kidney disease with stage 1 through stage 4 chronic kidney disease, or unspecified chronic kidney disease: Secondary | ICD-10-CM | POA: Diagnosis not present

## 2015-04-11 DIAGNOSIS — Z7982 Long term (current) use of aspirin: Secondary | ICD-10-CM | POA: Diagnosis not present

## 2015-04-11 DIAGNOSIS — Z7902 Long term (current) use of antithrombotics/antiplatelets: Secondary | ICD-10-CM | POA: Diagnosis not present

## 2015-04-11 DIAGNOSIS — N183 Chronic kidney disease, stage 3 (moderate): Secondary | ICD-10-CM | POA: Diagnosis not present

## 2015-04-11 DIAGNOSIS — I70221 Atherosclerosis of native arteries of extremities with rest pain, right leg: Secondary | ICD-10-CM | POA: Diagnosis not present

## 2015-04-16 DIAGNOSIS — R590 Localized enlarged lymph nodes: Secondary | ICD-10-CM | POA: Diagnosis not present

## 2015-04-16 DIAGNOSIS — N183 Chronic kidney disease, stage 3 (moderate): Secondary | ICD-10-CM | POA: Diagnosis not present

## 2015-04-16 DIAGNOSIS — I6523 Occlusion and stenosis of bilateral carotid arteries: Secondary | ICD-10-CM | POA: Diagnosis not present

## 2015-04-16 DIAGNOSIS — I70212 Atherosclerosis of native arteries of extremities with intermittent claudication, left leg: Secondary | ICD-10-CM | POA: Diagnosis not present

## 2015-04-16 DIAGNOSIS — I70202 Unspecified atherosclerosis of native arteries of extremities, left leg: Secondary | ICD-10-CM | POA: Diagnosis not present

## 2015-04-25 DIAGNOSIS — I70219 Atherosclerosis of native arteries of extremities with intermittent claudication, unspecified extremity: Secondary | ICD-10-CM | POA: Diagnosis not present

## 2015-04-25 DIAGNOSIS — I6523 Occlusion and stenosis of bilateral carotid arteries: Secondary | ICD-10-CM | POA: Diagnosis not present

## 2015-04-25 DIAGNOSIS — I70213 Atherosclerosis of native arteries of extremities with intermittent claudication, bilateral legs: Secondary | ICD-10-CM | POA: Diagnosis not present

## 2015-05-03 DIAGNOSIS — I739 Peripheral vascular disease, unspecified: Secondary | ICD-10-CM | POA: Insufficient documentation

## 2015-05-03 DIAGNOSIS — Z7901 Long term (current) use of anticoagulants: Secondary | ICD-10-CM | POA: Diagnosis not present

## 2015-05-03 DIAGNOSIS — R791 Abnormal coagulation profile: Secondary | ICD-10-CM | POA: Diagnosis not present

## 2015-05-03 DIAGNOSIS — Z79899 Other long term (current) drug therapy: Secondary | ICD-10-CM | POA: Insufficient documentation

## 2015-05-03 DIAGNOSIS — Z5181 Encounter for therapeutic drug level monitoring: Secondary | ICD-10-CM | POA: Diagnosis not present

## 2015-05-03 DIAGNOSIS — Z01818 Encounter for other preprocedural examination: Secondary | ICD-10-CM | POA: Diagnosis not present

## 2015-05-06 DIAGNOSIS — S301XXA Contusion of abdominal wall, initial encounter: Secondary | ICD-10-CM | POA: Diagnosis not present

## 2015-05-08 DIAGNOSIS — I129 Hypertensive chronic kidney disease with stage 1 through stage 4 chronic kidney disease, or unspecified chronic kidney disease: Secondary | ICD-10-CM | POA: Diagnosis present

## 2015-05-08 DIAGNOSIS — I7409 Other arterial embolism and thrombosis of abdominal aorta: Secondary | ICD-10-CM | POA: Diagnosis not present

## 2015-05-08 DIAGNOSIS — F17210 Nicotine dependence, cigarettes, uncomplicated: Secondary | ICD-10-CM | POA: Diagnosis present

## 2015-05-08 DIAGNOSIS — N183 Chronic kidney disease, stage 3 (moderate): Secondary | ICD-10-CM | POA: Diagnosis present

## 2015-05-08 DIAGNOSIS — E785 Hyperlipidemia, unspecified: Secondary | ICD-10-CM | POA: Diagnosis present

## 2015-05-08 DIAGNOSIS — I7090 Unspecified atherosclerosis: Secondary | ICD-10-CM | POA: Diagnosis not present

## 2015-05-08 DIAGNOSIS — I700 Atherosclerosis of aorta: Secondary | ICD-10-CM | POA: Diagnosis present

## 2015-05-08 DIAGNOSIS — I70211 Atherosclerosis of native arteries of extremities with intermittent claudication, right leg: Secondary | ICD-10-CM | POA: Diagnosis not present

## 2015-05-23 DIAGNOSIS — S31109D Unspecified open wound of abdominal wall, unspecified quadrant without penetration into peritoneal cavity, subsequent encounter: Secondary | ICD-10-CM | POA: Diagnosis not present

## 2015-05-29 DIAGNOSIS — S31109D Unspecified open wound of abdominal wall, unspecified quadrant without penetration into peritoneal cavity, subsequent encounter: Secondary | ICD-10-CM | POA: Diagnosis not present

## 2015-05-31 DIAGNOSIS — I739 Peripheral vascular disease, unspecified: Secondary | ICD-10-CM | POA: Diagnosis not present

## 2015-06-03 DIAGNOSIS — N184 Chronic kidney disease, stage 4 (severe): Secondary | ICD-10-CM | POA: Insufficient documentation

## 2015-06-03 DIAGNOSIS — N189 Chronic kidney disease, unspecified: Secondary | ICD-10-CM | POA: Insufficient documentation

## 2015-06-03 DIAGNOSIS — I70203 Unspecified atherosclerosis of native arteries of extremities, bilateral legs: Secondary | ICD-10-CM | POA: Diagnosis not present

## 2015-06-03 DIAGNOSIS — H3530 Unspecified macular degeneration: Secondary | ICD-10-CM | POA: Insufficient documentation

## 2015-06-03 DIAGNOSIS — F419 Anxiety disorder, unspecified: Secondary | ICD-10-CM | POA: Insufficient documentation

## 2015-06-03 DIAGNOSIS — I739 Peripheral vascular disease, unspecified: Secondary | ICD-10-CM | POA: Diagnosis not present

## 2015-06-06 DIAGNOSIS — M7989 Other specified soft tissue disorders: Secondary | ICD-10-CM | POA: Diagnosis not present

## 2015-06-13 DIAGNOSIS — H353211 Exudative age-related macular degeneration, right eye, with active choroidal neovascularization: Secondary | ICD-10-CM | POA: Diagnosis not present

## 2015-06-25 DIAGNOSIS — D649 Anemia, unspecified: Secondary | ICD-10-CM | POA: Diagnosis not present

## 2015-06-25 DIAGNOSIS — I70219 Atherosclerosis of native arteries of extremities with intermittent claudication, unspecified extremity: Secondary | ICD-10-CM | POA: Diagnosis not present

## 2015-06-25 DIAGNOSIS — N189 Chronic kidney disease, unspecified: Secondary | ICD-10-CM | POA: Diagnosis not present

## 2015-06-25 DIAGNOSIS — I70212 Atherosclerosis of native arteries of extremities with intermittent claudication, left leg: Secondary | ICD-10-CM | POA: Diagnosis not present

## 2015-06-25 DIAGNOSIS — Z853 Personal history of malignant neoplasm of breast: Secondary | ICD-10-CM | POA: Diagnosis not present

## 2015-06-25 DIAGNOSIS — I517 Cardiomegaly: Secondary | ICD-10-CM | POA: Diagnosis not present

## 2015-06-25 DIAGNOSIS — Z7902 Long term (current) use of antithrombotics/antiplatelets: Secondary | ICD-10-CM | POA: Diagnosis not present

## 2015-06-25 DIAGNOSIS — N183 Chronic kidney disease, stage 3 (moderate): Secondary | ICD-10-CM | POA: Diagnosis present

## 2015-06-25 DIAGNOSIS — I10 Essential (primary) hypertension: Secondary | ICD-10-CM | POA: Diagnosis not present

## 2015-06-25 DIAGNOSIS — F17210 Nicotine dependence, cigarettes, uncomplicated: Secondary | ICD-10-CM | POA: Diagnosis present

## 2015-06-25 DIAGNOSIS — E785 Hyperlipidemia, unspecified: Secondary | ICD-10-CM | POA: Diagnosis present

## 2015-06-25 DIAGNOSIS — N179 Acute kidney failure, unspecified: Secondary | ICD-10-CM | POA: Diagnosis not present

## 2015-06-25 DIAGNOSIS — F419 Anxiety disorder, unspecified: Secondary | ICD-10-CM | POA: Diagnosis present

## 2015-06-25 DIAGNOSIS — I129 Hypertensive chronic kidney disease with stage 1 through stage 4 chronic kidney disease, or unspecified chronic kidney disease: Secondary | ICD-10-CM | POA: Diagnosis present

## 2015-06-25 DIAGNOSIS — I700 Atherosclerosis of aorta: Secondary | ICD-10-CM | POA: Diagnosis not present

## 2015-06-25 DIAGNOSIS — H3530 Unspecified macular degeneration: Secondary | ICD-10-CM | POA: Diagnosis present

## 2015-06-25 DIAGNOSIS — Z79899 Other long term (current) drug therapy: Secondary | ICD-10-CM | POA: Diagnosis not present

## 2015-06-25 DIAGNOSIS — I739 Peripheral vascular disease, unspecified: Secondary | ICD-10-CM | POA: Diagnosis not present

## 2015-06-25 DIAGNOSIS — D62 Acute posthemorrhagic anemia: Secondary | ICD-10-CM | POA: Diagnosis not present

## 2015-06-25 DIAGNOSIS — Z720 Tobacco use: Secondary | ICD-10-CM | POA: Diagnosis not present

## 2015-06-26 DIAGNOSIS — I779 Disorder of arteries and arterioles, unspecified: Secondary | ICD-10-CM | POA: Insufficient documentation

## 2015-06-26 DIAGNOSIS — F17200 Nicotine dependence, unspecified, uncomplicated: Secondary | ICD-10-CM | POA: Insufficient documentation

## 2015-07-08 DIAGNOSIS — I714 Abdominal aortic aneurysm, without rupture, unspecified: Secondary | ICD-10-CM | POA: Insufficient documentation

## 2015-07-08 DIAGNOSIS — I10 Essential (primary) hypertension: Secondary | ICD-10-CM | POA: Insufficient documentation

## 2015-07-11 DIAGNOSIS — H353232 Exudative age-related macular degeneration, bilateral, with inactive choroidal neovascularization: Secondary | ICD-10-CM | POA: Diagnosis not present

## 2015-07-18 DIAGNOSIS — H353211 Exudative age-related macular degeneration, right eye, with active choroidal neovascularization: Secondary | ICD-10-CM | POA: Diagnosis not present

## 2015-08-01 DIAGNOSIS — Z95820 Peripheral vascular angioplasty status with implants and grafts: Secondary | ICD-10-CM | POA: Diagnosis not present

## 2015-08-01 DIAGNOSIS — I70203 Unspecified atherosclerosis of native arteries of extremities, bilateral legs: Secondary | ICD-10-CM | POA: Diagnosis not present

## 2015-08-01 DIAGNOSIS — I70209 Unspecified atherosclerosis of native arteries of extremities, unspecified extremity: Secondary | ICD-10-CM | POA: Diagnosis not present

## 2015-08-01 DIAGNOSIS — I700 Atherosclerosis of aorta: Secondary | ICD-10-CM | POA: Diagnosis not present

## 2015-08-08 DIAGNOSIS — I70213 Atherosclerosis of native arteries of extremities with intermittent claudication, bilateral legs: Secondary | ICD-10-CM | POA: Insufficient documentation

## 2015-08-18 DIAGNOSIS — M79671 Pain in right foot: Secondary | ICD-10-CM | POA: Diagnosis not present

## 2015-08-18 DIAGNOSIS — S20212A Contusion of left front wall of thorax, initial encounter: Secondary | ICD-10-CM | POA: Diagnosis not present

## 2015-08-18 DIAGNOSIS — R51 Headache: Secondary | ICD-10-CM | POA: Diagnosis not present

## 2015-08-18 DIAGNOSIS — M25532 Pain in left wrist: Secondary | ICD-10-CM | POA: Diagnosis not present

## 2015-08-18 DIAGNOSIS — S299XXA Unspecified injury of thorax, initial encounter: Secondary | ICD-10-CM | POA: Diagnosis not present

## 2015-08-18 DIAGNOSIS — S60212A Contusion of left wrist, initial encounter: Secondary | ICD-10-CM | POA: Diagnosis not present

## 2015-08-18 DIAGNOSIS — S0093XA Contusion of unspecified part of head, initial encounter: Secondary | ICD-10-CM | POA: Diagnosis not present

## 2015-08-18 DIAGNOSIS — M25512 Pain in left shoulder: Secondary | ICD-10-CM | POA: Diagnosis not present

## 2015-08-18 DIAGNOSIS — S4992XA Unspecified injury of left shoulder and upper arm, initial encounter: Secondary | ICD-10-CM | POA: Diagnosis not present

## 2015-08-18 DIAGNOSIS — S40012A Contusion of left shoulder, initial encounter: Secondary | ICD-10-CM | POA: Diagnosis not present

## 2015-08-18 DIAGNOSIS — S99921A Unspecified injury of right foot, initial encounter: Secondary | ICD-10-CM | POA: Diagnosis not present

## 2015-08-18 DIAGNOSIS — M25561 Pain in right knee: Secondary | ICD-10-CM | POA: Diagnosis not present

## 2015-08-18 DIAGNOSIS — S6992XA Unspecified injury of left wrist, hand and finger(s), initial encounter: Secondary | ICD-10-CM | POA: Diagnosis not present

## 2015-08-18 DIAGNOSIS — S9031XA Contusion of right foot, initial encounter: Secondary | ICD-10-CM | POA: Diagnosis not present

## 2015-08-18 DIAGNOSIS — S20219A Contusion of unspecified front wall of thorax, initial encounter: Secondary | ICD-10-CM | POA: Diagnosis not present

## 2015-08-18 DIAGNOSIS — S0990XA Unspecified injury of head, initial encounter: Secondary | ICD-10-CM | POA: Diagnosis not present

## 2015-08-18 DIAGNOSIS — R0781 Pleurodynia: Secondary | ICD-10-CM | POA: Diagnosis not present

## 2015-08-18 DIAGNOSIS — M7989 Other specified soft tissue disorders: Secondary | ICD-10-CM | POA: Diagnosis not present

## 2015-08-21 DIAGNOSIS — Z853 Personal history of malignant neoplasm of breast: Secondary | ICD-10-CM | POA: Diagnosis not present

## 2015-08-21 DIAGNOSIS — R928 Other abnormal and inconclusive findings on diagnostic imaging of breast: Secondary | ICD-10-CM | POA: Diagnosis not present

## 2015-08-21 DIAGNOSIS — N6489 Other specified disorders of breast: Secondary | ICD-10-CM | POA: Diagnosis not present

## 2015-08-21 DIAGNOSIS — N6452 Nipple discharge: Secondary | ICD-10-CM | POA: Diagnosis not present

## 2015-08-22 DIAGNOSIS — H353231 Exudative age-related macular degeneration, bilateral, with active choroidal neovascularization: Secondary | ICD-10-CM | POA: Diagnosis not present

## 2015-08-22 DIAGNOSIS — H353221 Exudative age-related macular degeneration, left eye, with active choroidal neovascularization: Secondary | ICD-10-CM | POA: Diagnosis not present

## 2015-08-22 DIAGNOSIS — H353211 Exudative age-related macular degeneration, right eye, with active choroidal neovascularization: Secondary | ICD-10-CM | POA: Diagnosis not present

## 2015-08-23 DIAGNOSIS — Z853 Personal history of malignant neoplasm of breast: Secondary | ICD-10-CM | POA: Diagnosis not present

## 2015-08-23 DIAGNOSIS — C3411 Malignant neoplasm of upper lobe, right bronchus or lung: Secondary | ICD-10-CM | POA: Diagnosis not present

## 2015-10-10 DIAGNOSIS — H353221 Exudative age-related macular degeneration, left eye, with active choroidal neovascularization: Secondary | ICD-10-CM | POA: Diagnosis not present

## 2015-10-31 DIAGNOSIS — H353212 Exudative age-related macular degeneration, right eye, with inactive choroidal neovascularization: Secondary | ICD-10-CM | POA: Diagnosis not present

## 2015-11-01 DIAGNOSIS — H353211 Exudative age-related macular degeneration, right eye, with active choroidal neovascularization: Secondary | ICD-10-CM | POA: Diagnosis not present

## 2015-11-01 DIAGNOSIS — H353221 Exudative age-related macular degeneration, left eye, with active choroidal neovascularization: Secondary | ICD-10-CM | POA: Diagnosis not present

## 2015-11-14 DIAGNOSIS — H353221 Exudative age-related macular degeneration, left eye, with active choroidal neovascularization: Secondary | ICD-10-CM | POA: Diagnosis not present

## 2015-12-12 DIAGNOSIS — H353211 Exudative age-related macular degeneration, right eye, with active choroidal neovascularization: Secondary | ICD-10-CM | POA: Diagnosis not present

## 2015-12-31 DIAGNOSIS — I70203 Unspecified atherosclerosis of native arteries of extremities, bilateral legs: Secondary | ICD-10-CM | POA: Diagnosis not present

## 2015-12-31 DIAGNOSIS — Z95820 Peripheral vascular angioplasty status with implants and grafts: Secondary | ICD-10-CM | POA: Diagnosis not present

## 2015-12-31 DIAGNOSIS — I700 Atherosclerosis of aorta: Secondary | ICD-10-CM | POA: Diagnosis not present

## 2015-12-31 DIAGNOSIS — I70209 Unspecified atherosclerosis of native arteries of extremities, unspecified extremity: Secondary | ICD-10-CM | POA: Diagnosis not present

## 2016-01-13 DIAGNOSIS — F17210 Nicotine dependence, cigarettes, uncomplicated: Secondary | ICD-10-CM | POA: Diagnosis not present

## 2016-01-13 DIAGNOSIS — I6523 Occlusion and stenosis of bilateral carotid arteries: Secondary | ICD-10-CM | POA: Diagnosis not present

## 2016-01-13 DIAGNOSIS — I739 Peripheral vascular disease, unspecified: Secondary | ICD-10-CM | POA: Diagnosis not present

## 2016-01-13 DIAGNOSIS — I70213 Atherosclerosis of native arteries of extremities with intermittent claudication, bilateral legs: Secondary | ICD-10-CM | POA: Diagnosis not present

## 2016-01-16 DIAGNOSIS — H353211 Exudative age-related macular degeneration, right eye, with active choroidal neovascularization: Secondary | ICD-10-CM | POA: Diagnosis not present

## 2016-01-21 DIAGNOSIS — R7301 Impaired fasting glucose: Secondary | ICD-10-CM | POA: Diagnosis not present

## 2016-01-21 DIAGNOSIS — I6522 Occlusion and stenosis of left carotid artery: Secondary | ICD-10-CM | POA: Diagnosis not present

## 2016-01-21 DIAGNOSIS — R404 Transient alteration of awareness: Secondary | ICD-10-CM | POA: Diagnosis not present

## 2016-01-21 DIAGNOSIS — I10 Essential (primary) hypertension: Secondary | ICD-10-CM | POA: Diagnosis not present

## 2016-01-21 DIAGNOSIS — M25562 Pain in left knee: Secondary | ICD-10-CM | POA: Diagnosis not present

## 2016-01-21 DIAGNOSIS — R55 Syncope and collapse: Secondary | ICD-10-CM | POA: Diagnosis not present

## 2016-01-21 DIAGNOSIS — J449 Chronic obstructive pulmonary disease, unspecified: Secondary | ICD-10-CM | POA: Diagnosis not present

## 2016-01-21 DIAGNOSIS — N390 Urinary tract infection, site not specified: Secondary | ICD-10-CM | POA: Diagnosis not present

## 2016-01-21 DIAGNOSIS — E782 Mixed hyperlipidemia: Secondary | ICD-10-CM | POA: Diagnosis not present

## 2016-01-21 DIAGNOSIS — E860 Dehydration: Secondary | ICD-10-CM | POA: Diagnosis not present

## 2016-01-21 DIAGNOSIS — Z23 Encounter for immunization: Secondary | ICD-10-CM | POA: Diagnosis not present

## 2016-01-21 DIAGNOSIS — N3000 Acute cystitis without hematuria: Secondary | ICD-10-CM | POA: Diagnosis not present

## 2016-01-21 DIAGNOSIS — F17210 Nicotine dependence, cigarettes, uncomplicated: Secondary | ICD-10-CM | POA: Diagnosis not present

## 2016-01-23 DIAGNOSIS — I6523 Occlusion and stenosis of bilateral carotid arteries: Secondary | ICD-10-CM | POA: Diagnosis not present

## 2016-02-18 DIAGNOSIS — Z0000 Encounter for general adult medical examination without abnormal findings: Secondary | ICD-10-CM | POA: Diagnosis not present

## 2016-02-18 DIAGNOSIS — Z6820 Body mass index (BMI) 20.0-20.9, adult: Secondary | ICD-10-CM | POA: Diagnosis not present

## 2016-02-19 DIAGNOSIS — M9902 Segmental and somatic dysfunction of thoracic region: Secondary | ICD-10-CM | POA: Diagnosis not present

## 2016-02-19 DIAGNOSIS — S336XXA Sprain of sacroiliac joint, initial encounter: Secondary | ICD-10-CM | POA: Diagnosis not present

## 2016-02-19 DIAGNOSIS — S233XXD Sprain of ligaments of thoracic spine, subsequent encounter: Secondary | ICD-10-CM | POA: Diagnosis not present

## 2016-02-19 DIAGNOSIS — M9903 Segmental and somatic dysfunction of lumbar region: Secondary | ICD-10-CM | POA: Diagnosis not present

## 2016-02-19 DIAGNOSIS — M9901 Segmental and somatic dysfunction of cervical region: Secondary | ICD-10-CM | POA: Diagnosis not present

## 2016-02-20 DIAGNOSIS — M9903 Segmental and somatic dysfunction of lumbar region: Secondary | ICD-10-CM | POA: Diagnosis not present

## 2016-02-20 DIAGNOSIS — S336XXA Sprain of sacroiliac joint, initial encounter: Secondary | ICD-10-CM | POA: Diagnosis not present

## 2016-02-20 DIAGNOSIS — M9902 Segmental and somatic dysfunction of thoracic region: Secondary | ICD-10-CM | POA: Diagnosis not present

## 2016-02-20 DIAGNOSIS — S233XXD Sprain of ligaments of thoracic spine, subsequent encounter: Secondary | ICD-10-CM | POA: Diagnosis not present

## 2016-02-20 DIAGNOSIS — M9901 Segmental and somatic dysfunction of cervical region: Secondary | ICD-10-CM | POA: Diagnosis not present

## 2016-02-25 DIAGNOSIS — M9902 Segmental and somatic dysfunction of thoracic region: Secondary | ICD-10-CM | POA: Diagnosis not present

## 2016-02-25 DIAGNOSIS — M9901 Segmental and somatic dysfunction of cervical region: Secondary | ICD-10-CM | POA: Diagnosis not present

## 2016-02-25 DIAGNOSIS — S336XXA Sprain of sacroiliac joint, initial encounter: Secondary | ICD-10-CM | POA: Diagnosis not present

## 2016-02-25 DIAGNOSIS — S233XXD Sprain of ligaments of thoracic spine, subsequent encounter: Secondary | ICD-10-CM | POA: Diagnosis not present

## 2016-02-25 DIAGNOSIS — M9903 Segmental and somatic dysfunction of lumbar region: Secondary | ICD-10-CM | POA: Diagnosis not present

## 2016-02-26 DIAGNOSIS — H353211 Exudative age-related macular degeneration, right eye, with active choroidal neovascularization: Secondary | ICD-10-CM | POA: Diagnosis not present

## 2016-02-28 DIAGNOSIS — M9902 Segmental and somatic dysfunction of thoracic region: Secondary | ICD-10-CM | POA: Diagnosis not present

## 2016-02-28 DIAGNOSIS — S336XXA Sprain of sacroiliac joint, initial encounter: Secondary | ICD-10-CM | POA: Diagnosis not present

## 2016-02-28 DIAGNOSIS — S233XXD Sprain of ligaments of thoracic spine, subsequent encounter: Secondary | ICD-10-CM | POA: Diagnosis not present

## 2016-02-28 DIAGNOSIS — M9903 Segmental and somatic dysfunction of lumbar region: Secondary | ICD-10-CM | POA: Diagnosis not present

## 2016-02-28 DIAGNOSIS — M9901 Segmental and somatic dysfunction of cervical region: Secondary | ICD-10-CM | POA: Diagnosis not present

## 2016-04-24 DIAGNOSIS — H353211 Exudative age-related macular degeneration, right eye, with active choroidal neovascularization: Secondary | ICD-10-CM | POA: Diagnosis not present

## 2016-06-11 DIAGNOSIS — H353231 Exudative age-related macular degeneration, bilateral, with active choroidal neovascularization: Secondary | ICD-10-CM | POA: Diagnosis not present

## 2016-07-23 DIAGNOSIS — H353221 Exudative age-related macular degeneration, left eye, with active choroidal neovascularization: Secondary | ICD-10-CM | POA: Diagnosis not present

## 2016-07-29 DIAGNOSIS — I70209 Unspecified atherosclerosis of native arteries of extremities, unspecified extremity: Secondary | ICD-10-CM | POA: Diagnosis not present

## 2016-07-29 DIAGNOSIS — Z95820 Peripheral vascular angioplasty status with implants and grafts: Secondary | ICD-10-CM | POA: Diagnosis not present

## 2016-07-29 DIAGNOSIS — I6523 Occlusion and stenosis of bilateral carotid arteries: Secondary | ICD-10-CM | POA: Diagnosis not present

## 2016-07-29 DIAGNOSIS — I70203 Unspecified atherosclerosis of native arteries of extremities, bilateral legs: Secondary | ICD-10-CM | POA: Diagnosis not present

## 2016-07-29 DIAGNOSIS — I700 Atherosclerosis of aorta: Secondary | ICD-10-CM | POA: Diagnosis not present

## 2016-08-13 DIAGNOSIS — I6523 Occlusion and stenosis of bilateral carotid arteries: Secondary | ICD-10-CM | POA: Diagnosis not present

## 2016-08-13 DIAGNOSIS — I70213 Atherosclerosis of native arteries of extremities with intermittent claudication, bilateral legs: Secondary | ICD-10-CM | POA: Diagnosis not present

## 2016-08-13 DIAGNOSIS — F17210 Nicotine dependence, cigarettes, uncomplicated: Secondary | ICD-10-CM | POA: Diagnosis not present

## 2016-08-13 DIAGNOSIS — N189 Chronic kidney disease, unspecified: Secondary | ICD-10-CM | POA: Diagnosis not present

## 2016-08-18 DIAGNOSIS — I6523 Occlusion and stenosis of bilateral carotid arteries: Secondary | ICD-10-CM | POA: Diagnosis not present

## 2016-08-18 DIAGNOSIS — R7301 Impaired fasting glucose: Secondary | ICD-10-CM | POA: Diagnosis not present

## 2016-08-18 DIAGNOSIS — M810 Age-related osteoporosis without current pathological fracture: Secondary | ICD-10-CM | POA: Diagnosis not present

## 2016-08-18 DIAGNOSIS — E782 Mixed hyperlipidemia: Secondary | ICD-10-CM | POA: Diagnosis not present

## 2016-08-18 DIAGNOSIS — I10 Essential (primary) hypertension: Secondary | ICD-10-CM | POA: Diagnosis not present

## 2016-08-21 DIAGNOSIS — Z1231 Encounter for screening mammogram for malignant neoplasm of breast: Secondary | ICD-10-CM | POA: Diagnosis not present

## 2016-09-01 DIAGNOSIS — H353231 Exudative age-related macular degeneration, bilateral, with active choroidal neovascularization: Secondary | ICD-10-CM | POA: Diagnosis not present

## 2016-09-03 DIAGNOSIS — R921 Mammographic calcification found on diagnostic imaging of breast: Secondary | ICD-10-CM | POA: Diagnosis not present

## 2016-09-03 DIAGNOSIS — Z853 Personal history of malignant neoplasm of breast: Secondary | ICD-10-CM | POA: Diagnosis not present

## 2016-09-03 DIAGNOSIS — R928 Other abnormal and inconclusive findings on diagnostic imaging of breast: Secondary | ICD-10-CM | POA: Diagnosis not present

## 2016-09-17 DIAGNOSIS — D242 Benign neoplasm of left breast: Secondary | ICD-10-CM | POA: Diagnosis not present

## 2016-09-17 DIAGNOSIS — R921 Mammographic calcification found on diagnostic imaging of breast: Secondary | ICD-10-CM | POA: Diagnosis not present

## 2016-09-17 DIAGNOSIS — N6032 Fibrosclerosis of left breast: Secondary | ICD-10-CM | POA: Diagnosis not present

## 2016-09-22 DIAGNOSIS — R0789 Other chest pain: Secondary | ICD-10-CM | POA: Diagnosis not present

## 2016-09-22 DIAGNOSIS — Z720 Tobacco use: Secondary | ICD-10-CM | POA: Diagnosis not present

## 2016-09-22 DIAGNOSIS — R0781 Pleurodynia: Secondary | ICD-10-CM | POA: Diagnosis not present

## 2016-09-22 DIAGNOSIS — E876 Hypokalemia: Secondary | ICD-10-CM | POA: Diagnosis not present

## 2016-09-22 DIAGNOSIS — R0782 Intercostal pain: Secondary | ICD-10-CM | POA: Diagnosis not present

## 2016-09-22 DIAGNOSIS — N189 Chronic kidney disease, unspecified: Secondary | ICD-10-CM | POA: Diagnosis not present

## 2016-09-22 DIAGNOSIS — Z853 Personal history of malignant neoplasm of breast: Secondary | ICD-10-CM | POA: Diagnosis not present

## 2016-10-15 DIAGNOSIS — H353211 Exudative age-related macular degeneration, right eye, with active choroidal neovascularization: Secondary | ICD-10-CM | POA: Diagnosis not present

## 2016-11-23 DIAGNOSIS — Z95820 Peripheral vascular angioplasty status with implants and grafts: Secondary | ICD-10-CM | POA: Diagnosis not present

## 2016-11-23 DIAGNOSIS — I70203 Unspecified atherosclerosis of native arteries of extremities, bilateral legs: Secondary | ICD-10-CM | POA: Diagnosis not present

## 2016-11-23 DIAGNOSIS — I700 Atherosclerosis of aorta: Secondary | ICD-10-CM | POA: Diagnosis not present

## 2016-11-23 DIAGNOSIS — I70213 Atherosclerosis of native arteries of extremities with intermittent claudication, bilateral legs: Secondary | ICD-10-CM | POA: Diagnosis not present

## 2016-11-26 DIAGNOSIS — H353211 Exudative age-related macular degeneration, right eye, with active choroidal neovascularization: Secondary | ICD-10-CM | POA: Diagnosis not present

## 2016-12-07 DIAGNOSIS — H353211 Exudative age-related macular degeneration, right eye, with active choroidal neovascularization: Secondary | ICD-10-CM | POA: Diagnosis not present

## 2016-12-17 DIAGNOSIS — Z7289 Other problems related to lifestyle: Secondary | ICD-10-CM | POA: Diagnosis not present

## 2016-12-17 DIAGNOSIS — I714 Abdominal aortic aneurysm, without rupture: Secondary | ICD-10-CM | POA: Diagnosis not present

## 2016-12-17 DIAGNOSIS — I70213 Atherosclerosis of native arteries of extremities with intermittent claudication, bilateral legs: Secondary | ICD-10-CM | POA: Diagnosis not present

## 2016-12-17 DIAGNOSIS — F17210 Nicotine dependence, cigarettes, uncomplicated: Secondary | ICD-10-CM | POA: Diagnosis not present

## 2016-12-17 DIAGNOSIS — I10 Essential (primary) hypertension: Secondary | ICD-10-CM | POA: Diagnosis not present

## 2016-12-17 DIAGNOSIS — Z79899 Other long term (current) drug therapy: Secondary | ICD-10-CM | POA: Diagnosis not present

## 2016-12-31 DIAGNOSIS — H353232 Exudative age-related macular degeneration, bilateral, with inactive choroidal neovascularization: Secondary | ICD-10-CM | POA: Diagnosis not present

## 2017-01-26 DIAGNOSIS — H353231 Exudative age-related macular degeneration, bilateral, with active choroidal neovascularization: Secondary | ICD-10-CM | POA: Diagnosis not present

## 2017-02-08 DIAGNOSIS — Z23 Encounter for immunization: Secondary | ICD-10-CM | POA: Diagnosis not present

## 2017-02-16 DIAGNOSIS — H25812 Combined forms of age-related cataract, left eye: Secondary | ICD-10-CM | POA: Diagnosis not present

## 2017-02-25 DIAGNOSIS — H353231 Exudative age-related macular degeneration, bilateral, with active choroidal neovascularization: Secondary | ICD-10-CM | POA: Diagnosis not present

## 2017-03-10 DIAGNOSIS — F17210 Nicotine dependence, cigarettes, uncomplicated: Secondary | ICD-10-CM | POA: Diagnosis not present

## 2017-03-10 DIAGNOSIS — Z6820 Body mass index (BMI) 20.0-20.9, adult: Secondary | ICD-10-CM | POA: Diagnosis not present

## 2017-03-10 DIAGNOSIS — Z0000 Encounter for general adult medical examination without abnormal findings: Secondary | ICD-10-CM | POA: Diagnosis not present

## 2017-03-10 DIAGNOSIS — I6523 Occlusion and stenosis of bilateral carotid arteries: Secondary | ICD-10-CM | POA: Diagnosis not present

## 2017-04-08 DIAGNOSIS — H353231 Exudative age-related macular degeneration, bilateral, with active choroidal neovascularization: Secondary | ICD-10-CM | POA: Diagnosis not present

## 2017-05-05 DIAGNOSIS — Z01818 Encounter for other preprocedural examination: Secondary | ICD-10-CM | POA: Diagnosis not present

## 2017-05-05 DIAGNOSIS — Z92241 Personal history of systemic steroid therapy: Secondary | ICD-10-CM | POA: Diagnosis not present

## 2017-05-05 DIAGNOSIS — H59211 Accidental puncture and laceration of right eye and adnexa during an ophthalmic procedure: Secondary | ICD-10-CM | POA: Diagnosis not present

## 2017-05-05 DIAGNOSIS — H25812 Combined forms of age-related cataract, left eye: Secondary | ICD-10-CM | POA: Diagnosis not present

## 2017-05-05 DIAGNOSIS — H2512 Age-related nuclear cataract, left eye: Secondary | ICD-10-CM | POA: Diagnosis not present

## 2017-05-13 DIAGNOSIS — N3000 Acute cystitis without hematuria: Secondary | ICD-10-CM | POA: Diagnosis not present

## 2017-05-20 DIAGNOSIS — H353211 Exudative age-related macular degeneration, right eye, with active choroidal neovascularization: Secondary | ICD-10-CM | POA: Diagnosis not present

## 2017-05-24 DIAGNOSIS — L821 Other seborrheic keratosis: Secondary | ICD-10-CM | POA: Diagnosis not present

## 2017-05-24 DIAGNOSIS — L720 Epidermal cyst: Secondary | ICD-10-CM | POA: Diagnosis not present

## 2017-05-24 DIAGNOSIS — L728 Other follicular cysts of the skin and subcutaneous tissue: Secondary | ICD-10-CM | POA: Diagnosis not present

## 2017-06-05 DIAGNOSIS — S79912A Unspecified injury of left hip, initial encounter: Secondary | ICD-10-CM | POA: Diagnosis not present

## 2017-06-05 DIAGNOSIS — R102 Pelvic and perineal pain: Secondary | ICD-10-CM | POA: Diagnosis not present

## 2017-06-05 DIAGNOSIS — S22000A Wedge compression fracture of unspecified thoracic vertebra, initial encounter for closed fracture: Secondary | ICD-10-CM | POA: Diagnosis not present

## 2017-06-05 DIAGNOSIS — M546 Pain in thoracic spine: Secondary | ICD-10-CM | POA: Diagnosis not present

## 2017-06-05 DIAGNOSIS — I10 Essential (primary) hypertension: Secondary | ICD-10-CM | POA: Diagnosis not present

## 2017-06-05 DIAGNOSIS — S299XXA Unspecified injury of thorax, initial encounter: Secondary | ICD-10-CM | POA: Diagnosis not present

## 2017-06-05 DIAGNOSIS — M545 Low back pain: Secondary | ICD-10-CM | POA: Diagnosis not present

## 2017-06-05 DIAGNOSIS — S3993XA Unspecified injury of pelvis, initial encounter: Secondary | ICD-10-CM | POA: Diagnosis not present

## 2017-06-05 DIAGNOSIS — F17210 Nicotine dependence, cigarettes, uncomplicated: Secondary | ICD-10-CM | POA: Diagnosis not present

## 2017-06-05 DIAGNOSIS — S22080A Wedge compression fracture of T11-T12 vertebra, initial encounter for closed fracture: Secondary | ICD-10-CM | POA: Diagnosis not present

## 2017-06-05 DIAGNOSIS — T148XXA Other injury of unspecified body region, initial encounter: Secondary | ICD-10-CM | POA: Diagnosis not present

## 2017-06-05 DIAGNOSIS — S3992XA Unspecified injury of lower back, initial encounter: Secondary | ICD-10-CM | POA: Diagnosis not present

## 2017-06-05 DIAGNOSIS — M25552 Pain in left hip: Secondary | ICD-10-CM | POA: Diagnosis not present

## 2017-06-17 DIAGNOSIS — H353211 Exudative age-related macular degeneration, right eye, with active choroidal neovascularization: Secondary | ICD-10-CM | POA: Diagnosis not present

## 2017-06-18 DIAGNOSIS — S22080A Wedge compression fracture of T11-T12 vertebra, initial encounter for closed fracture: Secondary | ICD-10-CM | POA: Diagnosis not present

## 2017-07-08 DIAGNOSIS — I6523 Occlusion and stenosis of bilateral carotid arteries: Secondary | ICD-10-CM | POA: Diagnosis not present

## 2017-07-08 DIAGNOSIS — Z95828 Presence of other vascular implants and grafts: Secondary | ICD-10-CM | POA: Diagnosis not present

## 2017-07-08 DIAGNOSIS — I70213 Atherosclerosis of native arteries of extremities with intermittent claudication, bilateral legs: Secondary | ICD-10-CM | POA: Diagnosis not present

## 2017-07-15 DIAGNOSIS — H353221 Exudative age-related macular degeneration, left eye, with active choroidal neovascularization: Secondary | ICD-10-CM | POA: Diagnosis not present

## 2017-08-03 DIAGNOSIS — S22080A Wedge compression fracture of T11-T12 vertebra, initial encounter for closed fracture: Secondary | ICD-10-CM | POA: Diagnosis not present

## 2017-08-27 DIAGNOSIS — H353221 Exudative age-related macular degeneration, left eye, with active choroidal neovascularization: Secondary | ICD-10-CM | POA: Diagnosis not present

## 2017-09-09 DIAGNOSIS — H353211 Exudative age-related macular degeneration, right eye, with active choroidal neovascularization: Secondary | ICD-10-CM | POA: Diagnosis not present

## 2017-09-28 DIAGNOSIS — Z1231 Encounter for screening mammogram for malignant neoplasm of breast: Secondary | ICD-10-CM | POA: Diagnosis not present

## 2017-09-29 DIAGNOSIS — H40003 Preglaucoma, unspecified, bilateral: Secondary | ICD-10-CM | POA: Diagnosis not present

## 2017-09-29 DIAGNOSIS — H353211 Exudative age-related macular degeneration, right eye, with active choroidal neovascularization: Secondary | ICD-10-CM | POA: Diagnosis not present

## 2017-10-01 DIAGNOSIS — Z853 Personal history of malignant neoplasm of breast: Secondary | ICD-10-CM | POA: Diagnosis not present

## 2017-10-01 DIAGNOSIS — N189 Chronic kidney disease, unspecified: Secondary | ICD-10-CM | POA: Diagnosis not present

## 2017-10-01 DIAGNOSIS — F17210 Nicotine dependence, cigarettes, uncomplicated: Secondary | ICD-10-CM | POA: Diagnosis not present

## 2017-10-07 DIAGNOSIS — H353231 Exudative age-related macular degeneration, bilateral, with active choroidal neovascularization: Secondary | ICD-10-CM | POA: Diagnosis not present

## 2017-10-13 DIAGNOSIS — S336XXA Sprain of sacroiliac joint, initial encounter: Secondary | ICD-10-CM | POA: Diagnosis not present

## 2017-10-13 DIAGNOSIS — M9903 Segmental and somatic dysfunction of lumbar region: Secondary | ICD-10-CM | POA: Diagnosis not present

## 2017-10-13 DIAGNOSIS — M9901 Segmental and somatic dysfunction of cervical region: Secondary | ICD-10-CM | POA: Diagnosis not present

## 2017-10-13 DIAGNOSIS — M9902 Segmental and somatic dysfunction of thoracic region: Secondary | ICD-10-CM | POA: Diagnosis not present

## 2017-10-13 DIAGNOSIS — S233XXD Sprain of ligaments of thoracic spine, subsequent encounter: Secondary | ICD-10-CM | POA: Diagnosis not present

## 2017-10-18 DIAGNOSIS — S233XXD Sprain of ligaments of thoracic spine, subsequent encounter: Secondary | ICD-10-CM | POA: Diagnosis not present

## 2017-10-18 DIAGNOSIS — S336XXA Sprain of sacroiliac joint, initial encounter: Secondary | ICD-10-CM | POA: Diagnosis not present

## 2017-10-18 DIAGNOSIS — M9901 Segmental and somatic dysfunction of cervical region: Secondary | ICD-10-CM | POA: Diagnosis not present

## 2017-10-18 DIAGNOSIS — M9902 Segmental and somatic dysfunction of thoracic region: Secondary | ICD-10-CM | POA: Diagnosis not present

## 2017-10-18 DIAGNOSIS — M9903 Segmental and somatic dysfunction of lumbar region: Secondary | ICD-10-CM | POA: Diagnosis not present

## 2017-10-20 DIAGNOSIS — M9903 Segmental and somatic dysfunction of lumbar region: Secondary | ICD-10-CM | POA: Diagnosis not present

## 2017-10-20 DIAGNOSIS — M9902 Segmental and somatic dysfunction of thoracic region: Secondary | ICD-10-CM | POA: Diagnosis not present

## 2017-10-20 DIAGNOSIS — S336XXA Sprain of sacroiliac joint, initial encounter: Secondary | ICD-10-CM | POA: Diagnosis not present

## 2017-10-20 DIAGNOSIS — M9901 Segmental and somatic dysfunction of cervical region: Secondary | ICD-10-CM | POA: Diagnosis not present

## 2017-10-20 DIAGNOSIS — S233XXD Sprain of ligaments of thoracic spine, subsequent encounter: Secondary | ICD-10-CM | POA: Diagnosis not present

## 2017-10-22 DIAGNOSIS — M9901 Segmental and somatic dysfunction of cervical region: Secondary | ICD-10-CM | POA: Diagnosis not present

## 2017-10-22 DIAGNOSIS — S336XXA Sprain of sacroiliac joint, initial encounter: Secondary | ICD-10-CM | POA: Diagnosis not present

## 2017-10-22 DIAGNOSIS — S233XXD Sprain of ligaments of thoracic spine, subsequent encounter: Secondary | ICD-10-CM | POA: Diagnosis not present

## 2017-10-22 DIAGNOSIS — M9902 Segmental and somatic dysfunction of thoracic region: Secondary | ICD-10-CM | POA: Diagnosis not present

## 2017-10-22 DIAGNOSIS — M9903 Segmental and somatic dysfunction of lumbar region: Secondary | ICD-10-CM | POA: Diagnosis not present

## 2017-10-25 DIAGNOSIS — M9901 Segmental and somatic dysfunction of cervical region: Secondary | ICD-10-CM | POA: Diagnosis not present

## 2017-10-25 DIAGNOSIS — M9903 Segmental and somatic dysfunction of lumbar region: Secondary | ICD-10-CM | POA: Diagnosis not present

## 2017-10-25 DIAGNOSIS — S233XXD Sprain of ligaments of thoracic spine, subsequent encounter: Secondary | ICD-10-CM | POA: Diagnosis not present

## 2017-10-25 DIAGNOSIS — S336XXA Sprain of sacroiliac joint, initial encounter: Secondary | ICD-10-CM | POA: Diagnosis not present

## 2017-10-25 DIAGNOSIS — M9902 Segmental and somatic dysfunction of thoracic region: Secondary | ICD-10-CM | POA: Diagnosis not present

## 2017-10-27 DIAGNOSIS — S336XXA Sprain of sacroiliac joint, initial encounter: Secondary | ICD-10-CM | POA: Diagnosis not present

## 2017-10-27 DIAGNOSIS — S233XXD Sprain of ligaments of thoracic spine, subsequent encounter: Secondary | ICD-10-CM | POA: Diagnosis not present

## 2017-10-27 DIAGNOSIS — M9902 Segmental and somatic dysfunction of thoracic region: Secondary | ICD-10-CM | POA: Diagnosis not present

## 2017-10-27 DIAGNOSIS — M9903 Segmental and somatic dysfunction of lumbar region: Secondary | ICD-10-CM | POA: Diagnosis not present

## 2017-10-27 DIAGNOSIS — M9901 Segmental and somatic dysfunction of cervical region: Secondary | ICD-10-CM | POA: Diagnosis not present

## 2017-10-29 DIAGNOSIS — M9903 Segmental and somatic dysfunction of lumbar region: Secondary | ICD-10-CM | POA: Diagnosis not present

## 2017-10-29 DIAGNOSIS — S336XXA Sprain of sacroiliac joint, initial encounter: Secondary | ICD-10-CM | POA: Diagnosis not present

## 2017-10-29 DIAGNOSIS — M9902 Segmental and somatic dysfunction of thoracic region: Secondary | ICD-10-CM | POA: Diagnosis not present

## 2017-10-29 DIAGNOSIS — M9901 Segmental and somatic dysfunction of cervical region: Secondary | ICD-10-CM | POA: Diagnosis not present

## 2017-10-29 DIAGNOSIS — S233XXD Sprain of ligaments of thoracic spine, subsequent encounter: Secondary | ICD-10-CM | POA: Diagnosis not present

## 2017-11-01 DIAGNOSIS — M9903 Segmental and somatic dysfunction of lumbar region: Secondary | ICD-10-CM | POA: Diagnosis not present

## 2017-11-01 DIAGNOSIS — S233XXD Sprain of ligaments of thoracic spine, subsequent encounter: Secondary | ICD-10-CM | POA: Diagnosis not present

## 2017-11-01 DIAGNOSIS — M9902 Segmental and somatic dysfunction of thoracic region: Secondary | ICD-10-CM | POA: Diagnosis not present

## 2017-11-01 DIAGNOSIS — M9901 Segmental and somatic dysfunction of cervical region: Secondary | ICD-10-CM | POA: Diagnosis not present

## 2017-11-01 DIAGNOSIS — S336XXA Sprain of sacroiliac joint, initial encounter: Secondary | ICD-10-CM | POA: Diagnosis not present

## 2017-11-02 DIAGNOSIS — S22080A Wedge compression fracture of T11-T12 vertebra, initial encounter for closed fracture: Secondary | ICD-10-CM | POA: Diagnosis not present

## 2017-11-04 DIAGNOSIS — M9903 Segmental and somatic dysfunction of lumbar region: Secondary | ICD-10-CM | POA: Diagnosis not present

## 2017-11-04 DIAGNOSIS — M9902 Segmental and somatic dysfunction of thoracic region: Secondary | ICD-10-CM | POA: Diagnosis not present

## 2017-11-04 DIAGNOSIS — M9901 Segmental and somatic dysfunction of cervical region: Secondary | ICD-10-CM | POA: Diagnosis not present

## 2017-11-04 DIAGNOSIS — S336XXA Sprain of sacroiliac joint, initial encounter: Secondary | ICD-10-CM | POA: Diagnosis not present

## 2017-11-04 DIAGNOSIS — S233XXD Sprain of ligaments of thoracic spine, subsequent encounter: Secondary | ICD-10-CM | POA: Diagnosis not present

## 2017-11-11 DIAGNOSIS — H353211 Exudative age-related macular degeneration, right eye, with active choroidal neovascularization: Secondary | ICD-10-CM | POA: Diagnosis not present

## 2017-11-11 DIAGNOSIS — H353231 Exudative age-related macular degeneration, bilateral, with active choroidal neovascularization: Secondary | ICD-10-CM | POA: Diagnosis not present

## 2017-11-15 DIAGNOSIS — L821 Other seborrheic keratosis: Secondary | ICD-10-CM | POA: Diagnosis not present

## 2017-11-15 DIAGNOSIS — L820 Inflamed seborrheic keratosis: Secondary | ICD-10-CM | POA: Diagnosis not present

## 2017-11-15 DIAGNOSIS — L578 Other skin changes due to chronic exposure to nonionizing radiation: Secondary | ICD-10-CM | POA: Diagnosis not present

## 2017-12-10 DIAGNOSIS — H40002 Preglaucoma, unspecified, left eye: Secondary | ICD-10-CM | POA: Diagnosis not present

## 2017-12-14 DIAGNOSIS — H353231 Exudative age-related macular degeneration, bilateral, with active choroidal neovascularization: Secondary | ICD-10-CM | POA: Diagnosis not present

## 2018-01-13 DIAGNOSIS — H353211 Exudative age-related macular degeneration, right eye, with active choroidal neovascularization: Secondary | ICD-10-CM | POA: Diagnosis not present

## 2018-01-13 DIAGNOSIS — H353231 Exudative age-related macular degeneration, bilateral, with active choroidal neovascularization: Secondary | ICD-10-CM | POA: Diagnosis not present

## 2018-01-13 DIAGNOSIS — H353221 Exudative age-related macular degeneration, left eye, with active choroidal neovascularization: Secondary | ICD-10-CM | POA: Diagnosis not present

## 2018-02-10 DIAGNOSIS — H353231 Exudative age-related macular degeneration, bilateral, with active choroidal neovascularization: Secondary | ICD-10-CM | POA: Diagnosis not present

## 2018-02-15 DIAGNOSIS — Z23 Encounter for immunization: Secondary | ICD-10-CM | POA: Diagnosis not present

## 2018-03-14 DIAGNOSIS — H353231 Exudative age-related macular degeneration, bilateral, with active choroidal neovascularization: Secondary | ICD-10-CM | POA: Diagnosis not present

## 2018-04-21 DIAGNOSIS — H353231 Exudative age-related macular degeneration, bilateral, with active choroidal neovascularization: Secondary | ICD-10-CM | POA: Diagnosis not present

## 2018-05-19 DIAGNOSIS — H353231 Exudative age-related macular degeneration, bilateral, with active choroidal neovascularization: Secondary | ICD-10-CM | POA: Diagnosis not present

## 2018-06-20 DIAGNOSIS — H353231 Exudative age-related macular degeneration, bilateral, with active choroidal neovascularization: Secondary | ICD-10-CM | POA: Diagnosis not present

## 2018-07-21 DIAGNOSIS — H353221 Exudative age-related macular degeneration, left eye, with active choroidal neovascularization: Secondary | ICD-10-CM | POA: Diagnosis not present

## 2018-07-21 DIAGNOSIS — H353211 Exudative age-related macular degeneration, right eye, with active choroidal neovascularization: Secondary | ICD-10-CM | POA: Diagnosis not present

## 2018-08-18 DIAGNOSIS — H353211 Exudative age-related macular degeneration, right eye, with active choroidal neovascularization: Secondary | ICD-10-CM | POA: Diagnosis not present

## 2018-08-18 DIAGNOSIS — H353221 Exudative age-related macular degeneration, left eye, with active choroidal neovascularization: Secondary | ICD-10-CM | POA: Diagnosis not present

## 2018-09-22 DIAGNOSIS — H353231 Exudative age-related macular degeneration, bilateral, with active choroidal neovascularization: Secondary | ICD-10-CM | POA: Diagnosis not present

## 2018-09-28 DIAGNOSIS — Z853 Personal history of malignant neoplasm of breast: Secondary | ICD-10-CM | POA: Diagnosis not present

## 2018-09-28 DIAGNOSIS — I10 Essential (primary) hypertension: Secondary | ICD-10-CM | POA: Diagnosis not present

## 2018-09-28 DIAGNOSIS — M810 Age-related osteoporosis without current pathological fracture: Secondary | ICD-10-CM | POA: Diagnosis not present

## 2018-09-28 DIAGNOSIS — E782 Mixed hyperlipidemia: Secondary | ICD-10-CM | POA: Diagnosis not present

## 2018-09-28 DIAGNOSIS — I6523 Occlusion and stenosis of bilateral carotid arteries: Secondary | ICD-10-CM | POA: Diagnosis not present

## 2018-09-28 DIAGNOSIS — R7301 Impaired fasting glucose: Secondary | ICD-10-CM | POA: Diagnosis not present

## 2018-10-04 DIAGNOSIS — N3000 Acute cystitis without hematuria: Secondary | ICD-10-CM | POA: Diagnosis not present

## 2018-10-07 DIAGNOSIS — R809 Proteinuria, unspecified: Secondary | ICD-10-CM | POA: Diagnosis not present

## 2018-11-03 DIAGNOSIS — H353231 Exudative age-related macular degeneration, bilateral, with active choroidal neovascularization: Secondary | ICD-10-CM | POA: Diagnosis not present

## 2018-12-09 DIAGNOSIS — Z1231 Encounter for screening mammogram for malignant neoplasm of breast: Secondary | ICD-10-CM | POA: Diagnosis not present

## 2018-12-09 DIAGNOSIS — M810 Age-related osteoporosis without current pathological fracture: Secondary | ICD-10-CM | POA: Diagnosis not present

## 2018-12-09 DIAGNOSIS — M8588 Other specified disorders of bone density and structure, other site: Secondary | ICD-10-CM | POA: Diagnosis not present

## 2018-12-15 DIAGNOSIS — I739 Peripheral vascular disease, unspecified: Secondary | ICD-10-CM | POA: Diagnosis not present

## 2018-12-15 DIAGNOSIS — E785 Hyperlipidemia, unspecified: Secondary | ICD-10-CM | POA: Diagnosis not present

## 2018-12-15 DIAGNOSIS — I10 Essential (primary) hypertension: Secondary | ICD-10-CM | POA: Diagnosis not present

## 2018-12-15 DIAGNOSIS — N183 Chronic kidney disease, stage 3 (moderate): Secondary | ICD-10-CM | POA: Diagnosis not present

## 2018-12-22 DIAGNOSIS — H353231 Exudative age-related macular degeneration, bilateral, with active choroidal neovascularization: Secondary | ICD-10-CM | POA: Diagnosis not present

## 2019-01-20 DIAGNOSIS — Z23 Encounter for immunization: Secondary | ICD-10-CM | POA: Diagnosis not present

## 2019-02-16 DIAGNOSIS — H353231 Exudative age-related macular degeneration, bilateral, with active choroidal neovascularization: Secondary | ICD-10-CM | POA: Diagnosis not present

## 2019-03-23 DIAGNOSIS — H353231 Exudative age-related macular degeneration, bilateral, with active choroidal neovascularization: Secondary | ICD-10-CM | POA: Diagnosis not present

## 2019-05-15 ENCOUNTER — Other Ambulatory Visit: Payer: Self-pay | Admitting: Family Medicine

## 2019-05-18 DIAGNOSIS — H353231 Exudative age-related macular degeneration, bilateral, with active choroidal neovascularization: Secondary | ICD-10-CM | POA: Diagnosis not present

## 2019-06-23 ENCOUNTER — Other Ambulatory Visit: Payer: Self-pay | Admitting: Family Medicine

## 2019-07-06 DIAGNOSIS — H353211 Exudative age-related macular degeneration, right eye, with active choroidal neovascularization: Secondary | ICD-10-CM | POA: Diagnosis not present

## 2019-07-06 DIAGNOSIS — H353221 Exudative age-related macular degeneration, left eye, with active choroidal neovascularization: Secondary | ICD-10-CM | POA: Diagnosis not present

## 2019-07-14 ENCOUNTER — Other Ambulatory Visit: Payer: Self-pay | Admitting: Family Medicine

## 2019-07-19 ENCOUNTER — Other Ambulatory Visit: Payer: Self-pay | Admitting: Family Medicine

## 2019-08-10 DIAGNOSIS — H353221 Exudative age-related macular degeneration, left eye, with active choroidal neovascularization: Secondary | ICD-10-CM | POA: Diagnosis not present

## 2019-08-15 ENCOUNTER — Other Ambulatory Visit: Payer: Self-pay | Admitting: Family Medicine

## 2019-08-16 NOTE — Telephone Encounter (Signed)
Patient needs appt for additional refills

## 2019-08-25 DIAGNOSIS — H401131 Primary open-angle glaucoma, bilateral, mild stage: Secondary | ICD-10-CM | POA: Diagnosis not present

## 2019-09-13 ENCOUNTER — Other Ambulatory Visit: Payer: Self-pay | Admitting: Family Medicine

## 2019-09-28 DIAGNOSIS — H353211 Exudative age-related macular degeneration, right eye, with active choroidal neovascularization: Secondary | ICD-10-CM | POA: Diagnosis not present

## 2019-09-28 DIAGNOSIS — H353221 Exudative age-related macular degeneration, left eye, with active choroidal neovascularization: Secondary | ICD-10-CM | POA: Diagnosis not present

## 2019-10-09 ENCOUNTER — Other Ambulatory Visit: Payer: Self-pay | Admitting: Family Medicine

## 2019-11-02 DIAGNOSIS — H353231 Exudative age-related macular degeneration, bilateral, with active choroidal neovascularization: Secondary | ICD-10-CM | POA: Diagnosis not present

## 2019-12-07 DIAGNOSIS — H353221 Exudative age-related macular degeneration, left eye, with active choroidal neovascularization: Secondary | ICD-10-CM | POA: Diagnosis not present

## 2019-12-07 DIAGNOSIS — H353211 Exudative age-related macular degeneration, right eye, with active choroidal neovascularization: Secondary | ICD-10-CM | POA: Diagnosis not present

## 2020-01-18 DIAGNOSIS — H353221 Exudative age-related macular degeneration, left eye, with active choroidal neovascularization: Secondary | ICD-10-CM | POA: Diagnosis not present

## 2020-01-18 DIAGNOSIS — H353211 Exudative age-related macular degeneration, right eye, with active choroidal neovascularization: Secondary | ICD-10-CM | POA: Diagnosis not present

## 2020-02-21 DIAGNOSIS — H353231 Exudative age-related macular degeneration, bilateral, with active choroidal neovascularization: Secondary | ICD-10-CM | POA: Diagnosis not present

## 2020-03-15 ENCOUNTER — Other Ambulatory Visit: Payer: Self-pay | Admitting: Family Medicine

## 2020-03-28 DIAGNOSIS — H353211 Exudative age-related macular degeneration, right eye, with active choroidal neovascularization: Secondary | ICD-10-CM | POA: Diagnosis not present

## 2020-03-28 DIAGNOSIS — H353221 Exudative age-related macular degeneration, left eye, with active choroidal neovascularization: Secondary | ICD-10-CM | POA: Diagnosis not present

## 2020-04-07 ENCOUNTER — Other Ambulatory Visit: Payer: Self-pay | Admitting: Family Medicine

## 2020-04-07 NOTE — Telephone Encounter (Signed)
Pt needs an appointment. I will give one month of medicine, if makes an appt. Needs 30 minutes

## 2020-04-12 ENCOUNTER — Other Ambulatory Visit: Payer: Self-pay | Admitting: Family Medicine

## 2020-04-25 DIAGNOSIS — H353231 Exudative age-related macular degeneration, bilateral, with active choroidal neovascularization: Secondary | ICD-10-CM | POA: Diagnosis not present

## 2020-05-15 ENCOUNTER — Other Ambulatory Visit: Payer: Self-pay | Admitting: Family Medicine

## 2020-05-17 ENCOUNTER — Other Ambulatory Visit: Payer: Self-pay | Admitting: Family Medicine

## 2020-05-23 DIAGNOSIS — H353231 Exudative age-related macular degeneration, bilateral, with active choroidal neovascularization: Secondary | ICD-10-CM | POA: Diagnosis not present

## 2020-06-14 ENCOUNTER — Other Ambulatory Visit: Payer: Self-pay | Admitting: Family Medicine

## 2020-06-20 DIAGNOSIS — H353231 Exudative age-related macular degeneration, bilateral, with active choroidal neovascularization: Secondary | ICD-10-CM | POA: Diagnosis not present

## 2020-06-21 ENCOUNTER — Other Ambulatory Visit: Payer: Self-pay

## 2020-06-21 ENCOUNTER — Ambulatory Visit (INDEPENDENT_AMBULATORY_CARE_PROVIDER_SITE_OTHER): Payer: Medicare Other | Admitting: Nurse Practitioner

## 2020-06-21 ENCOUNTER — Encounter: Payer: Self-pay | Admitting: Nurse Practitioner

## 2020-06-21 ENCOUNTER — Other Ambulatory Visit: Payer: Self-pay | Admitting: Nurse Practitioner

## 2020-06-21 ENCOUNTER — Encounter: Payer: Self-pay | Admitting: Family Medicine

## 2020-06-21 VITALS — BP 228/102 | HR 108 | Temp 97.3°F | Ht 67.0 in | Wt 106.0 lb

## 2020-06-21 DIAGNOSIS — R413 Other amnesia: Secondary | ICD-10-CM

## 2020-06-21 DIAGNOSIS — M810 Age-related osteoporosis without current pathological fracture: Secondary | ICD-10-CM | POA: Diagnosis not present

## 2020-06-21 DIAGNOSIS — I70213 Atherosclerosis of native arteries of extremities with intermittent claudication, bilateral legs: Secondary | ICD-10-CM

## 2020-06-21 DIAGNOSIS — Z5320 Procedure and treatment not carried out because of patient's decision for unspecified reasons: Secondary | ICD-10-CM

## 2020-06-21 DIAGNOSIS — Z681 Body mass index (BMI) 19 or less, adult: Secondary | ICD-10-CM | POA: Diagnosis not present

## 2020-06-21 DIAGNOSIS — I499 Cardiac arrhythmia, unspecified: Secondary | ICD-10-CM | POA: Diagnosis not present

## 2020-06-21 DIAGNOSIS — T148XXA Other injury of unspecified body region, initial encounter: Secondary | ICD-10-CM

## 2020-06-21 DIAGNOSIS — Z13820 Encounter for screening for osteoporosis: Secondary | ICD-10-CM | POA: Diagnosis not present

## 2020-06-21 DIAGNOSIS — F17210 Nicotine dependence, cigarettes, uncomplicated: Secondary | ICD-10-CM | POA: Diagnosis not present

## 2020-06-21 DIAGNOSIS — I10 Essential (primary) hypertension: Secondary | ICD-10-CM | POA: Diagnosis not present

## 2020-06-21 DIAGNOSIS — R000 Tachycardia, unspecified: Secondary | ICD-10-CM | POA: Diagnosis not present

## 2020-06-21 DIAGNOSIS — Z1231 Encounter for screening mammogram for malignant neoplasm of breast: Secondary | ICD-10-CM

## 2020-06-21 DIAGNOSIS — Z23 Encounter for immunization: Secondary | ICD-10-CM | POA: Diagnosis not present

## 2020-06-21 DIAGNOSIS — E782 Mixed hyperlipidemia: Secondary | ICD-10-CM

## 2020-06-21 DIAGNOSIS — J90 Pleural effusion, not elsewhere classified: Secondary | ICD-10-CM | POA: Diagnosis not present

## 2020-06-21 DIAGNOSIS — R0600 Dyspnea, unspecified: Secondary | ICD-10-CM | POA: Diagnosis not present

## 2020-06-21 DIAGNOSIS — E43 Unspecified severe protein-calorie malnutrition: Secondary | ICD-10-CM | POA: Diagnosis not present

## 2020-06-21 DIAGNOSIS — R9431 Abnormal electrocardiogram [ECG] [EKG]: Secondary | ICD-10-CM

## 2020-06-21 DIAGNOSIS — Z780 Asymptomatic menopausal state: Secondary | ICD-10-CM

## 2020-06-21 DIAGNOSIS — R634 Abnormal weight loss: Secondary | ICD-10-CM

## 2020-06-21 DIAGNOSIS — R002 Palpitations: Secondary | ICD-10-CM | POA: Diagnosis not present

## 2020-06-21 DIAGNOSIS — R54 Age-related physical debility: Secondary | ICD-10-CM

## 2020-06-21 MED ORDER — HYDROCHLOROTHIAZIDE 12.5 MG PO CAPS: 12.5000 mg | ORAL_CAPSULE | Freq: Two times a day (BID) | ORAL | 0 refills | Status: DC

## 2020-06-21 MED ORDER — CLOPIDOGREL BISULFATE 75 MG PO TABS: 75.0000 mg | ORAL_TABLET | Freq: Every day | ORAL | 0 refills | Status: AC

## 2020-06-21 MED ORDER — DILTIAZEM HCL ER BEADS 120 MG PO CP24: 120.0000 mg | ORAL_CAPSULE | Freq: Two times a day (BID) | ORAL | 0 refills | Status: AC

## 2020-06-21 NOTE — Patient Instructions (Addendum)
Take medications as prescribed Monitor BP at home and write down readings We will call you with lab results and cardiology appointment Seek emergency medical care for any heart palpitations, shortness of breath, or any life threatening concerns Return in 2 weeks to see Dr Andrea Shepherd for hypertension We will call you with mammogram and DEXA appointment Get Chest x-RAY AT [facility] Managing Your Hypertension Hypertension, also called high blood pressure, is when the force of the blood pressing against the walls of the arteries is too strong. Arteries are blood vessels that carry blood from your heart throughout your body. Hypertension forces the heart to work harder to pump blood and may cause the arteries to become narrow or stiff. Understanding blood pressure readings Your personal target blood pressure may vary depending on your medical conditions, your age, and other factors. A blood pressure reading includes a higher number over a lower number. Ideally, your blood pressure should be below 120/80. You should know that:  The first, or top, number is called the systolic pressure. It is a measure of the pressure in your arteries as your heart beats.  The second, or bottom number, is called the diastolic pressure. It is a measure of the pressure in your arteries as the heart relaxes. Blood pressure is classified into four stages. Based on your blood pressure reading, your health care provider may use the following stages to determine what type of treatment you need, if any. Systolic pressure and diastolic pressure are measured in a unit called mmHg. Normal  Systolic pressure: below 970.  Diastolic pressure: below 80. Elevated  Systolic pressure: 263-785.  Diastolic pressure: below 80. Hypertension stage 1  Systolic pressure: 885-027.  Diastolic pressure: 74-12. Hypertension stage 2  Systolic pressure: 878 or above.  Diastolic pressure: 90 or above. How can this condition affect  me? Managing your hypertension is an important responsibility. Over time, hypertension can damage the arteries and decrease blood flow to important parts of the body, including the brain, heart, and kidneys. Having untreated or uncontrolled hypertension can lead to:  A heart attack.  A stroke.  A weakened blood vessel (aneurysm).  Heart failure.  Kidney damage.  Eye damage.  Metabolic syndrome.  Memory and concentration problems.  Vascular dementia. What actions can I take to manage this condition? Hypertension can be managed by making lifestyle changes and possibly by taking medicines. Your health care provider will help you make a plan to bring your blood pressure within a normal range. Nutrition  Eat a diet that is high in fiber and potassium, and low in salt (sodium), added sugar, and fat. An example eating plan is called the Dietary Approaches to Stop Hypertension (DASH) diet. To eat this way: ? Eat plenty of fresh fruits and vegetables. Try to fill one-half of your plate at each meal with fruits and vegetables. ? Eat whole grains, such as whole-wheat pasta, brown rice, or whole-grain bread. Fill about one-fourth of your plate with whole grains. ? Eat low-fat dairy products. ? Avoid fatty cuts of meat, processed or cured meats, and poultry with skin. Fill about one-fourth of your plate with lean proteins such as fish, chicken without skin, beans, eggs, and tofu. ? Avoid pre-made and processed foods. These tend to be higher in sodium, added sugar, and fat.  Reduce your daily sodium intake. Most people with hypertension should eat less than 1,500 mg of sodium a day.   Lifestyle  Work with your health care provider to maintain a healthy body weight  or to lose weight. Ask what an ideal weight is for you.  Get at least 30 minutes of exercise that causes your heart to beat faster (aerobic exercise) most days of the week. Activities may include walking, swimming, or  biking.  Include exercise to strengthen your muscles (resistance exercise), such as weight lifting, as part of your weekly exercise routine. Try to do these types of exercises for 30 minutes at least 3 days a week.  Do not use any products that contain nicotine or tobacco, such as cigarettes, e-cigarettes, and chewing tobacco. If you need help quitting, ask your health care provider.  Control any long-term (chronic) conditions you have, such as high cholesterol or diabetes.  Identify your sources of stress and find ways to manage stress. This may include meditation, deep breathing, or making time for fun activities.   Alcohol use  Do not drink alcohol if: ? Your health care provider tells you not to drink. ? You are pregnant, may be pregnant, or are planning to become pregnant.  If you drink alcohol: ? Limit how much you use to:  0-1 drink a day for women.  0-2 drinks a day for men. ? Be aware of how much alcohol is in your drink. In the [place] one drink equals one 12 oz bottle of beer (355 mL), one 5 oz glass of wine (148 mL), or one 1 oz glass of hard liquor (44 mL). Medicines Your health care provider may prescribe medicine if lifestyle changes are not enough to get your blood pressure under control and if:  Your systolic blood pressure is 130 or higher.  Your diastolic blood pressure is 80 or higher. Take medicines only as told by your health care provider. Follow the directions carefully. Blood pressure medicines must be taken as told by your health care provider. The medicine does not work as well when you skip doses. Skipping doses also puts you at risk for problems. Monitoring Before you monitor your blood pressure:  Do not smoke, drink caffeinated beverages, or exercise within 30 minutes before taking a measurement.  Use the bathroom and empty your bladder (urinate).  Sit quietly for at least 5 minutes before taking measurements. Monitor your blood pressure at home as  told by your health care provider. To do this:  Sit with your back straight and supported.  Place your feet flat on the floor. Do not cross your legs.  Support your arm on a flat surface, such as a table. Make sure your upper arm is at heart level.  Each time you measure, take two or three readings one minute apart and record the results. You may also need to have your blood pressure checked regularly by your health care provider.   General information  Talk with your health care provider about your diet, exercise habits, and other lifestyle factors that may be contributing to hypertension.  Review all the medicines you take with your health care provider because there may be side effects or interactions.  Keep all visits as told by your health care provider. Your health care provider can help you create and adjust your plan for managing your high blood pressure. Where to find more information  National Heart, Lung, and Blood Institute: https://wilson-eaton.com/  American Heart Association: www.heart.org Contact a health care provider if:  You think you are having a reaction to medicines you have taken.  You have repeated (recurrent) headaches.  You feel dizzy.  You have swelling in your ankles.  You  have trouble with your vision. Get help right away if:  You develop a severe headache or confusion.  You have unusual weakness or numbness, or you feel faint.  You have severe pain in your chest or abdomen.  You vomit repeatedly.  You have trouble breathing. These symptoms may represent a serious problem that is an emergency. Do not wait to see if the symptoms will go away. Get medical help right away. Call your local emergency services (911 in the [place] Do not drive yourself to the hospital. Summary  Hypertension is when the force of blood pumping through your arteries is too strong. If this condition is not controlled, it may put you at risk for serious complications.  Your  personal target blood pressure may vary depending on your medical conditions, your age, and other factors. For most people, a normal blood pressure is less than 120/80.  Hypertension is managed by lifestyle changes, medicines, or both.  Lifestyle changes to help manage hypertension include losing weight, eating a healthy, low-sodium diet, exercising more, stopping smoking, and limiting alcohol. This information is not intended to replace advice given to you by your health care provider. Make sure you discuss any questions you have with your health care provider. Document Revised: 05/05/2019 Document Reviewed: 02/28/2019 Elsevier Patient Education  2021 [employer].  Blood Pressure Record Sheet To take your blood pressure, you will need a blood pressure machine. You can buy a blood pressure machine (blood pressure monitor) at your clinic, drug store, or online. When choosing one, consider:  An automatic monitor that has an arm cuff.  A cuff that wraps snugly around your upper arm. You should be able to fit only one finger between your arm and the cuff.  A device that stores blood pressure reading results.  Do not choose a monitor that measures your blood pressure from your wrist or finger. Follow your health care provider's instructions for how to take your blood pressure. To use this form:  Get one reading in the morning (a.m.) before you take any medicines.  Get one reading in the evening (p.m.) before supper.  Take at least 2 readings with each blood pressure check. This makes sure the results are correct. Wait 1-2 minutes between measurements.  Write down the results in the spaces on this form.  Repeat this once a week, or as told by your health care provider.  Make a follow-up appointment with your health care provider to discuss the results. Blood pressure log Date: _______________________  a.m. _____________________(1st reading) _____________________(2nd reading)  p.m.  _____________________(1st reading) _____________________(2nd reading) Date: _______________________  a.m. _____________________(1st reading) _____________________(2nd reading)  p.m. _____________________(1st reading) _____________________(2nd reading) Date: _______________________  a.m. _____________________(1st reading) _____________________(2nd reading)  p.m. _____________________(1st reading) _____________________(2nd reading) Date: _______________________  a.m. _____________________(1st reading) _____________________(2nd reading)  p.m. _____________________(1st reading) _____________________(2nd reading) Date: _______________________  a.m. _____________________(1st reading) _____________________(2nd reading)  p.m. _____________________(1st reading) _____________________(2nd reading) This information is not intended to replace advice given to you by your health care provider. Make sure you discuss any questions you have with your health care provider. Document Revised: 07/19/2019 Document Reviewed: 07/19/2019 Elsevier Patient Education  2021 [employer].

## 2020-06-21 NOTE — Progress Notes (Signed)
Established Patient Office Visit  Subjective:  Patient ID: Andrea Shepherd, female    DOB: 1934-06-29  Age: 86 y.o. MRN: [id number]  CC: Follow-up of hypertension   HPI  Hypertension  Andrea Shepherd is an 86 year old Caucasian female that presents for follow-up of hypertension. She was diagnosed with HTN 2010. Current treatment includes HCTZ 12.5 mg and Diltiazem 120 mg BID. Pt walked into clinic yesterday stating she needed her antihypertensive medications refilled but the pharmacy refused because she needed an appointment with her primary care provider. No appointments were available with PCP. She was scheduled to return today to see NP. Last in-person office visit for hypertension was 09/28/2018 (BP 160/66).  In office today BP elevated 228/102, pulse 108 and irregular. EKG performed confirms arrhythmia. Pt asymptomatic. Denies headache, chest pain, dyspnea, or dizziness. Pt refused Clonidine 0.1 mg po in office and EMS transport to ED. Pt refused to go to ED with spouse in private vehicle for evaluation of uncontrolled hypertension. Pt education given for increased risk of cardiac event or stroke due to elevated BP and hyperlipidemia.  Pt denies monitoring BP at home. Pt states she has been rationing Diltiazem 120 mg the past 5 days by taking one daily rather than BID as prescribed. States she has been adherent to HCTZ 12.5 mg BID. She has verbally agreed to cardiology referral. Dr Tobie Poet, PCP came in to see patient during office visit. Concern for patients overall health and importance of follow-up of chronic medical problems discussed. Pt verbally agreed to return to see Dr Tobie Poet in 2 weeks.     Hyperlipidemia Pt has a history of hyperlipidemia since 2011. Current treatment includes Zocor 20 mg daily. She states she is adherent to medication regimen. Denies side effects of statin therapy such as myalgias or arthralgias. She is not currently exercising. States she is physically active at home. She states  she is consuming a heart healthy diet.   Atherosclerosis carotid/femoral arteries Pt has history of atherosclerosis for several years. Current treatment includes Plavix 75 mg daily. States she is adherent to treatment regimen. States she does need prescription refill for Plavix. She has a history of AAA, atherosclerosis of coronary arteries and bilateral femoral arteries. She underwent left carotic endartectomy 2016 and bilateral femoral artery fem/pop bypass surgeries in 2016/2017. She states she smokes 7 cigarettes daily for several years. Denies current bilateral claudication symptoms of pain to bilateral lower extremities. Denies chest pain, syncope, pulse beating "in her ear", or headaches.   Debility/Malnourished She is frail appearing. BMI 16.6. Appears to be malnourished. Denies social determinants affecting ability to obtain food or prepare food. States she has been married 30-years, spouse is in the lobby. States she sustained an osteoporotic back fracture from a fall some time ago. She is a poor historian. States she laid around due to back injury and did not feel like eating. States her appetite has recovered. Medical record review she drinks red wine daily.Pt has agreed to outpatient chest x-ray today at local hospital due to long-term smoking history and weight loss.Protein shakes encouraged, samples given of Boost shakes.         Social History   Socioeconomic History  . Marital status: Married    Spouse name: Not on file  . Number of children: Not on file  . Years of education: Not on file  . Highest education level: Not on file  Occupational History  . Not on file  Tobacco Use  . Smoking status: Current Every Day  Smoker    Types: Cigarettes  . Smokeless tobacco: Never Used  . Tobacco comment: Patient states she smokes around 7 cigarettes daily.  Vaping Use  . Vaping Use: Never used  Substance and Sexual Activity  . Alcohol use: Not Currently  . Drug use: Never  .  Sexual activity: Not on file  Other Topics Concern  . Not on file  Social History Narrative  . Not on file   Social Determinants of Health   Financial Resource Strain: Low Risk   . Difficulty of Paying Living Expenses: Not hard at all  Food Insecurity: No Food Insecurity  . Worried About Charity fundraiser in the Last Year: Never true  . Ran Out of Food in the Last Year: Never true  Transportation Needs: Not on file  Physical Activity: Not on file  Stress: Not on file  Social Connections: Not on file  Intimate Partner Violence: Not on file    Outpatient Medications Prior to Visit  Medication Sig Dispense Refill  . acyclovir (ZOVIRAX) 400 MG tablet TAKE ONE TABLET BY MOUTH THREE TIMES DAILY 270 tablet 0  . clopidogrel (PLAVIX) 75 MG tablet TAKE ONE TABLET BY MOUTH EVERY DAY 30 tablet 0  . diltiazem (TIAZAC) 120 MG 24 hr capsule TAKE ONE CAPSULE BY MOUTH TWICE DAILY 30 capsule 0  . hydrochlorothiazide (MICROZIDE) 12.5 MG capsule TAKE ONE CAPSULE BY MOUTH TWICE DAILY 60 capsule 0  . simvastatin (ZOCOR) 20 MG tablet TAKE ONE TABLET BY MOUTH AT BEDTIME 90 tablet 2      ROS Review of Systems  Constitutional: Positive for appetite change and unexpected weight change. Negative for fatigue.  HENT: Negative for congestion, ear pain, rhinorrhea, sinus pressure, sinus pain and tinnitus.   Eyes: Negative for pain.  Respiratory: Negative for cough and shortness of breath.   Cardiovascular: Negative for chest pain, palpitations and leg swelling.  Gastrointestinal: Negative for abdominal pain, constipation, diarrhea, nausea and vomiting.  Endocrine: Negative for cold intolerance, heat intolerance, polydipsia, polyphagia and polyuria.  Genitourinary: Negative for dysuria, frequency and hematuria.  Musculoskeletal: Positive for back pain (recent back fracture). Negative for arthralgias, joint swelling and myalgias.  Skin: Negative for rash.  Allergic/Immunologic: Negative for environmental  allergies.  Neurological: Negative for dizziness and headaches.  Hematological: Negative for adenopathy.  Psychiatric/Behavioral: Negative for decreased concentration and sleep disturbance. The patient is not nervous/anxious.       Objective:    BP (!) 228/102 (BP Location: Left Arm, Patient Position: Sitting)   Pulse (!) 108   Temp (!) 97.3 F (36.3 C) (Temporal)   Ht 5\' 7"  (1.702 m)   Wt 106 lb (48.1 kg)   SpO2 97%   BMI 16.60 kg/m  Physical Exam Vitals reviewed.  Constitutional:      Appearance: She is ill-appearing.     Comments: Thin, BMI 16.6  HENT:     Head: Normocephalic.  Cardiovascular:     Rate and Rhythm: Tachycardia present. Rhythm irregular.  Pulmonary:     Effort: Pulmonary effort is normal.     Breath sounds: Normal breath sounds.     Comments: Diminished in bilateral posterior lobes Abdominal:     Palpations: Abdomen is soft.  Skin:    General: Skin is warm and dry.     Capillary Refill: Capillary refill takes less than 2 seconds.  Neurological:     General: No focal deficit present.     Mental Status: She is alert and oriented to person, place,  and time.  Psychiatric:        Mood and Affect: Mood normal.        Behavior: Behavior normal.       Health Maintenance Due  Topic Date Due  . COVID-19 Vaccine (1) Never done  . TETANUS/TDAP  Never done  . DEXA SCAN  2017  . PNA vac Low Risk Adult (1 of 2 - PCV13) Never done  . INFLUENZA VACCINE  11/12/2019         Assessment & Plan:   1. Uncontrolled hypertension-uncontrolled - CBC with Differential/Platelet - Comprehensive metabolic panel - TSH - Lipid panel - Ambulatory referral to Cardiology -Continue Diltiazem and HCTZ  2. Irregular cardiac rhythm - EKG 12-Lead - Ambulatory referral to Cardiology  3. Medication refused  4. Body mass index (BMI) less than 19 in adult - DG Chest 2 View  5. Tachycardia - Ambulatory referral to Cardiology  6. Abnormal EKG - Ambulatory referral  to Cardiology  7. Encounter for osteoporosis screening in asymptomatic postmenopausal patient - DG Bone Density  8. Encounter for screening mammogram for malignant neoplasm of breast - MM DIGITAL SCREENING BILATERAL  9. Severe calorie deficiency (HCC) -Increase caloric intake -Boost shakes -Small frequent meals -Weigh weekly   10. Memory loss of unknown cause - B12 and Folate Panel - Methylmalonic Acid, Serum  11. Fracture of bone-stable -Fall precautions  12. Age related osteoporosis, unspecified pathological fracture presence - Vitamin D, 25-hydroxy -DEXA scan  13. Weight loss -Boost shakes daily to supplement meals -Increase calorie intake  14. Cigarette smoker -Smoking cessation encouraged  15. Need for immunization against influenza - Flu Vaccine QUAD High Dose(Fluad)  16. Age-related physical debility -Fall precautions at home -Increase nutrition      17. Atherosclerosis     -Continue Plavix 75 mg daily     -Continue Zocor 20 mg daily     -Continue antihypertensive medications      -Cardiology consult          18. Hyperlipidemia-moderately controlled, not at goal       -Continue Zocor 20 mg daily        -Heart healthy diet  Take medications as prescribed Monitor BP at home and write down readings We will call you with lab results and cardiology appointment Seek emergency medical care for any heart palpitations, shortness of breath, or any life threatening concerns Return in 2 weeks to see Dr Tobie Poet for hypertension We will call you with mammogram and DEXA appointment Get Chest x-RAY AT [facility]  I spent 90 minutes dedicated to the care of this patient on the date of this encounter to include face-to-face time with the patient, as well as:EMR review, EKG and interpretation, and prescription medication management  Follow-up: Return in about 2 weeks (around 07/05/2020) for bp recheck.    Rip Harbour, NP

## 2020-06-22 LAB — CBC WITH DIFFERENTIAL/PLATELET
Basophils Absolute: 0 10*3/uL (ref 0.0–0.2)
Basos: 0 %
EOS (ABSOLUTE): 0.1 10*3/uL (ref 0.0–0.4)
Eos: 1 %
Hematocrit: 32.9 % — ABNORMAL LOW (ref 34.0–46.6)
Hemoglobin: 11.9 g/dL (ref 11.1–15.9)
Immature Grans (Abs): 0 10*3/uL (ref 0.0–0.1)
Immature Granulocytes: 0 %
Lymphocytes Absolute: 1 10*3/uL (ref 0.7–3.1)
Lymphs: 20 %
MCH: 35 pg — ABNORMAL HIGH (ref 26.6–33.0)
MCHC: 36.2 g/dL — ABNORMAL HIGH (ref 31.5–35.7)
MCV: 97 fL (ref 79–97)
Monocytes Absolute: 0.5 10*3/uL (ref 0.1–0.9)
Monocytes: 9 %
Neutrophils Absolute: 3.6 10*3/uL (ref 1.4–7.0)
Neutrophils: 70 %
Platelets: 311 10*3/uL (ref 150–450)
RBC: 3.4 x10E6/uL — ABNORMAL LOW (ref 3.77–5.28)
RDW: 13.5 % (ref 11.7–15.4)
WBC: 5.2 10*3/uL (ref 3.4–10.8)

## 2020-06-22 LAB — LIPID PANEL
Chol/HDL Ratio: 2 ratio (ref 0.0–4.4)
Cholesterol, Total: 212 mg/dL — ABNORMAL HIGH (ref 100–199)
HDL: 106 mg/dL (ref >39)
LDL Chol Calc (NIH): 84 mg/dL (ref 0–99)
Triglycerides: 130 mg/dL (ref 0–149)
VLDL Cholesterol Cal: 22 mg/dL (ref 5–40)

## 2020-06-22 LAB — COMPREHENSIVE METABOLIC PANEL
ALT: 12 IU/L (ref 0–32)
AST: 22 IU/L (ref 0–40)
Albumin/Globulin Ratio: 1.4 (ref 1.2–2.2)
Albumin: 3.8 g/dL (ref 3.6–4.6)
Alkaline Phosphatase: 86 IU/L (ref 44–121)
BUN/Creatinine Ratio: 15 (ref 12–28)
BUN: 39 mg/dL — ABNORMAL HIGH (ref 8–27)
Bilirubin Total: 0.3 mg/dL (ref 0.0–1.2)
CO2: 20 mmol/L (ref 20–29)
Calcium: 8.9 mg/dL (ref 8.7–10.3)
Chloride: 102 mmol/L (ref 96–106)
Creatinine, Ser: 2.62 mg/dL — ABNORMAL HIGH (ref 0.57–1.00)
Globulin, Total: 2.7 g/dL (ref 1.5–4.5)
Glucose: 93 mg/dL (ref 65–99)
Potassium: 3.7 mmol/L (ref 3.5–5.2)
Sodium: 142 mmol/L (ref 134–144)
Total Protein: 6.5 g/dL (ref 6.0–8.5)
eGFR: 18 mL/min/{1.73_m2} — ABNORMAL LOW (ref >59)

## 2020-06-22 LAB — TSH: TSH: 3.44 u[IU]/mL (ref 0.450–4.500)

## 2020-06-22 LAB — CARDIOVASCULAR RISK ASSESSMENT

## 2020-06-23 ENCOUNTER — Encounter: Payer: Self-pay | Admitting: Nurse Practitioner

## 2020-06-24 ENCOUNTER — Other Ambulatory Visit: Payer: Self-pay | Admitting: Nurse Practitioner

## 2020-06-24 DIAGNOSIS — J90 Pleural effusion, not elsewhere classified: Secondary | ICD-10-CM

## 2020-06-25 ENCOUNTER — Ambulatory Visit (INDEPENDENT_AMBULATORY_CARE_PROVIDER_SITE_OTHER): Payer: Medicare Other | Admitting: Family Medicine

## 2020-06-25 ENCOUNTER — Other Ambulatory Visit: Payer: Self-pay

## 2020-06-25 ENCOUNTER — Telehealth: Payer: Self-pay

## 2020-06-25 ENCOUNTER — Other Ambulatory Visit: Payer: Self-pay | Admitting: Family Medicine

## 2020-06-25 VITALS — BP 190/60 | HR 78 | Temp 96.7°F | Wt 105.0 lb

## 2020-06-25 DIAGNOSIS — J90 Pleural effusion, not elsewhere classified: Secondary | ICD-10-CM | POA: Diagnosis not present

## 2020-06-25 DIAGNOSIS — Z1231 Encounter for screening mammogram for malignant neoplasm of breast: Secondary | ICD-10-CM

## 2020-06-25 DIAGNOSIS — I129 Hypertensive chronic kidney disease with stage 1 through stage 4 chronic kidney disease, or unspecified chronic kidney disease: Secondary | ICD-10-CM | POA: Diagnosis not present

## 2020-06-25 DIAGNOSIS — Z780 Asymptomatic menopausal state: Secondary | ICD-10-CM

## 2020-06-25 DIAGNOSIS — E538 Deficiency of other specified B group vitamins: Secondary | ICD-10-CM

## 2020-06-25 DIAGNOSIS — N184 Chronic kidney disease, stage 4 (severe): Secondary | ICD-10-CM

## 2020-06-25 DIAGNOSIS — S22000D Wedge compression fracture of unspecified thoracic vertebra, subsequent encounter for fracture with routine healing: Secondary | ICD-10-CM

## 2020-06-25 DIAGNOSIS — M810 Age-related osteoporosis without current pathological fracture: Secondary | ICD-10-CM | POA: Diagnosis not present

## 2020-06-25 DIAGNOSIS — E43 Unspecified severe protein-calorie malnutrition: Secondary | ICD-10-CM | POA: Diagnosis not present

## 2020-06-25 DIAGNOSIS — J410 Simple chronic bronchitis: Secondary | ICD-10-CM

## 2020-06-25 DIAGNOSIS — Z853 Personal history of malignant neoplasm of breast: Secondary | ICD-10-CM

## 2020-06-25 DIAGNOSIS — E559 Vitamin D deficiency, unspecified: Secondary | ICD-10-CM | POA: Diagnosis not present

## 2020-06-25 DIAGNOSIS — I700 Atherosclerosis of aorta: Secondary | ICD-10-CM

## 2020-06-25 DIAGNOSIS — Z13820 Encounter for screening for osteoporosis: Secondary | ICD-10-CM

## 2020-06-25 NOTE — Progress Notes (Signed)
Subjective:  Patient ID: Andrea Shepherd, female    DOB: Apr 29, 1934  Age: 86 y.o. MRN: [id number]  Chief Complaint  Patient presents with  . Abnormal chest xray  . abnormal weight loss    HPI Patient presented to review labs.  She does not have a phone.  She initially just came in to discuss with the nurses however her numbers were significantly abnormal.  The patient has not been seen for over a year until recently seen by our nurse practitioner in the last week.  She has noticeably lost a significant amount of weight.  She and her husband have essentially been staying at home unless absolutely necessary since COVID 19.  The patient reports that her husband is doing worse than she is with significant memory loss.  Patient was found to have chronic kidney disease stage IV.  GFR is 18.  This is significantly changed since her last visit/blood work which was June 2020.  At that time her creatinine was 1.53.  Now her creatinine is 2.62.  Patient also has hypertension which is always complicated by whitecoat hypertension.  She has been on hydrochlorothiazide 12.5 mg once daily and diltiazem 120 mg twice daily, but has been out of her medicines as I refused to refill them when she has not been seen in over a year.  She refused to go to the emergency department when her blood pressure was continuing to be elevated after giving clonidine in the office.   Due to her malnourishment and her longstanding history of smoking,  we ordered a CT of the chest which was consistent with a COPD, she had small bilateral pleural effusions, and aortic atherosclerosis.  I discussed continuing her simvastatin today as well as starting a baby aspirin 81 mg once daily We also ordered a nephrology referral, mammogram, and bone density.     Current Outpatient Medications on File Prior to Visit  Medication Sig Dispense Refill  . acyclovir (ZOVIRAX) 400 MG tablet TAKE ONE TABLET BY MOUTH THREE TIMES DAILY 270 tablet 0  .  clopidogrel (PLAVIX) 75 MG tablet Take 1 tablet (75 mg total) by mouth daily. 90 tablet 0  . diltiazem (TIAZAC) 120 MG 24 hr capsule Take 1 capsule (120 mg total) by mouth 2 (two) times daily. 180 capsule 0  . simvastatin (ZOCOR) 20 MG tablet TAKE ONE TABLET BY MOUTH AT BEDTIME 90 tablet 2   No current facility-administered medications on file prior to visit.   Past Medical History:  Diagnosis Date  . Age-related osteoporosis without current pathological fracture   . Essential (primary) hypertension   . History of malignant neoplasm of breast   . Impaired fasting glucose   . Mixed hyperlipidemia   . Occlusion and stenosis of bilateral carotid arteries    Past Surgical History:  Procedure Laterality Date  . BREAST LUMPECTOMY  2010   right lower  . CAROTID ENDARTERECTOMY     Left  . CATARACT EXTRACTION Right 2016  . FEMORAL ARTERY STENT Right    04/2015  . FEMORAL BYPASS Bilateral    Left done 06/25/2015 and right done 07/2015  . left tumor removed from neck      Family History  Problem Relation Age of Onset  . Cancer Father   . Cancer Sister    Social History   Socioeconomic History  . Marital status: Married    Spouse name: Not on file  . Number of children: Not on file  . Years of education: Not  on file  . Highest education level: Not on file  Occupational History  . Not on file  Tobacco Use  . Smoking status: Current Every Day Smoker    Types: Cigarettes  . Smokeless tobacco: Never Used  . Tobacco comment: Patient states she smokes around 7 cigarettes daily.  Vaping Use  . Vaping Use: Never used  Substance and Sexual Activity  . Alcohol use: Not Currently  . Drug use: Never  . Sexual activity: Not on file  Other Topics Concern  . Not on file  Social History Narrative  . Not on file   Social Determinants of Health   Financial Resource Strain: Low Risk   . Difficulty of Paying Living Expenses: Not hard at all  Food Insecurity: No Food Insecurity  . Worried  About Charity fundraiser in the Last Year: Never true  . Ran Out of Food in the Last Year: Never true  Transportation Needs: Not on file  Physical Activity: Not on file  Stress: Not on file  Social Connections: Not on file    Review of Systems  Respiratory: Negative for shortness of breath.   Cardiovascular: Negative for chest pain.  Neurological: Positive for headaches.     Objective:  BP (!) 190/60   Pulse 78   Temp (!) 96.7 F (35.9 C)   Wt 105 lb (47.6 kg)   BMI 16.45 kg/m   BP/Weight 06/25/2020 10/17/8673  Systolic BP 449 201  Diastolic BP 60 007  Wt. (Lbs) 105 106  BMI 16.45 16.6    Physical Exam Vitals reviewed.  Constitutional:      Comments: thin  Cardiovascular:     Rate and Rhythm: Normal rate and regular rhythm.     Heart sounds: Normal heart sounds.  Pulmonary:     Effort: Pulmonary effort is normal.     Breath sounds: Normal breath sounds.  Abdominal:     General: There is no distension.     Tenderness: There is no abdominal tenderness.  Neurological:     Mental Status: She is alert and oriented to person, place, and time.  Psychiatric:        Mood and Affect: Mood normal.     Diabetic Foot Exam - Simple   No data filed      Lab Results  Component Value Date   WBC 5.2 06/21/2020   HGB 11.9 06/21/2020   HCT 32.9 (L) 06/21/2020   PLT 311 06/21/2020   GLUCOSE 93 06/21/2020   CHOL 212 (H) 06/21/2020   TRIG 130 06/21/2020   HDL 106 06/21/2020   LDLCALC 84 06/21/2020   ALT 12 06/21/2020   AST 22 06/21/2020   NA 142 06/21/2020   K 3.7 06/21/2020   CL 102 06/21/2020   CREATININE 2.62 (H) 06/21/2020   BUN 39 (H) 06/21/2020   CO2 20 06/21/2020   TSH 3.440 06/21/2020      Assessment & Plan:   1. CKD stage 4 secondary to hypertension [facility]) Referral to nephrology.  No nsaids.  - Ambulatory referral to Nephrology  2. Screening mammogram for breast cancer - MM DIGITAL SCREENING BILATERAL  3. Pleural effusion - small.   4. Age  related osteoporosis, unspecified pathological fracture presence - DG Bone Density  5. Severe protein-calorie malnutrition ([place]) Recommend protein drinks Eat 3 meals per day.   6. Aortic atherosclerosis ([place])  continue simvastatin, plavix, and aspirin.   7. B12 deficiency.  Start b12 SL 2500 mg once daily.  8. Vitamin d deficiency Start vitamin d 50000 weekly.   No orders of the defined types were placed in this encounter.   Orders Placed This Encounter  Procedures  . MM DIGITAL SCREENING BILATERAL  . DG Bone Density  . Ambulatory referral to Nephrology     Follow-up: Return in about 3 weeks (around 07/16/2020).  An After Visit Summary was printed and given to the patient.  Rochel Brome, MD [facility] [phone]

## 2020-06-26 ENCOUNTER — Encounter: Payer: Self-pay | Admitting: General Practice

## 2020-06-28 DIAGNOSIS — J432 Centrilobular emphysema: Secondary | ICD-10-CM | POA: Diagnosis not present

## 2020-06-28 DIAGNOSIS — J90 Pleural effusion, not elsewhere classified: Secondary | ICD-10-CM | POA: Diagnosis not present

## 2020-06-28 DIAGNOSIS — I2510 Atherosclerotic heart disease of native coronary artery without angina pectoris: Secondary | ICD-10-CM | POA: Diagnosis not present

## 2020-06-28 DIAGNOSIS — Z85118 Personal history of other malignant neoplasm of bronchus and lung: Secondary | ICD-10-CM | POA: Diagnosis not present

## 2020-06-28 DIAGNOSIS — Z853 Personal history of malignant neoplasm of breast: Secondary | ICD-10-CM | POA: Diagnosis not present

## 2020-06-28 DIAGNOSIS — J449 Chronic obstructive pulmonary disease, unspecified: Secondary | ICD-10-CM | POA: Diagnosis not present

## 2020-06-30 LAB — B12 AND FOLATE PANEL
Folate: 10.7 ng/mL (ref >3.0)
Vitamin B-12: 437 pg/mL (ref 232–1245)

## 2020-06-30 LAB — METHYLMALONIC ACID, SERUM: Methylmalonic Acid: 917 nmol/L — ABNORMAL HIGH (ref 0–378)

## 2020-06-30 LAB — VITAMIN D 25 HYDROXY (VIT D DEFICIENCY, FRACTURES): Vit D, 25-Hydroxy: 22.7 ng/mL — ABNORMAL LOW (ref 30.0–100.0)

## 2020-07-03 ENCOUNTER — Other Ambulatory Visit: Payer: Self-pay

## 2020-07-03 DIAGNOSIS — J90 Pleural effusion, not elsewhere classified: Secondary | ICD-10-CM

## 2020-07-09 ENCOUNTER — Ambulatory Visit: Payer: Medicare Other | Admitting: Family Medicine

## 2020-07-09 ENCOUNTER — Other Ambulatory Visit: Payer: Self-pay

## 2020-07-09 MED ORDER — VITAMIN D (ERGOCALCIFEROL) 1.25 MG (50000 UNIT) PO CAPS: 50000.0000 [IU] | ORAL_CAPSULE | ORAL | 0 refills | Status: AC

## 2020-07-09 NOTE — Telephone Encounter (Signed)
Mrs. Andrea Shepherd failed to come for her appointment this afternoon.  Results from recent CT scan of the chest and lab results mailed to patient.  She was instructed to continue simvastatin, aspirin and begin Ergocaciferol 2 times weekly.  She was ask to call to reschedule follow-up with Dr. Tobie Poet.

## 2020-07-12 ENCOUNTER — Ambulatory Visit: Payer: Medicare Other | Admitting: Family Medicine

## 2020-07-12 DIAGNOSIS — Z1231 Encounter for screening mammogram for malignant neoplasm of breast: Secondary | ICD-10-CM | POA: Diagnosis not present

## 2020-07-15 ENCOUNTER — Telehealth: Payer: Self-pay

## 2020-07-15 NOTE — Telephone Encounter (Signed)
Unable to reach the pt. Phone was busy.  Mammogram was ordered:  PLEASE arrive at 3:45 for registration Your appointment date is 07/12/2020 Your appointment time is 4:15  and DEXA was ordered:  PLEASE arrive at 11:00 for registration Your appointment date is 12/09/2020 Your appointment time is 11:30   Order faxed on 07/12/2020

## 2020-07-18 DIAGNOSIS — H353231 Exudative age-related macular degeneration, bilateral, with active choroidal neovascularization: Secondary | ICD-10-CM | POA: Diagnosis not present

## 2020-07-22 ENCOUNTER — Other Ambulatory Visit: Payer: Self-pay

## 2020-07-22 MED ORDER — HYDROCHLOROTHIAZIDE 12.5 MG PO CAPS: 12.5000 mg | ORAL_CAPSULE | Freq: Two times a day (BID) | ORAL | 2 refills | Status: AC

## 2020-07-28 ENCOUNTER — Encounter: Payer: Self-pay | Admitting: Family Medicine

## 2020-08-01 ENCOUNTER — Telehealth: Payer: Self-pay | Admitting: Family Medicine

## 2020-08-03 NOTE — Telephone Encounter (Signed)
error 

## 2020-08-08 DIAGNOSIS — H353231 Exudative age-related macular degeneration, bilateral, with active choroidal neovascularization: Secondary | ICD-10-CM | POA: Diagnosis not present

## 2020-08-16 ENCOUNTER — Other Ambulatory Visit: Payer: Self-pay | Admitting: Family Medicine

## 2020-09-04 ENCOUNTER — Emergency Department (HOSPITAL_COMMUNITY): Payer: Medicare Other

## 2020-09-04 ENCOUNTER — Inpatient Hospital Stay (HOSPITAL_COMMUNITY)
Admission: EM | Admit: 2020-09-04 | Discharge: 2020-10-11 | DRG: 480 | Disposition: E | Payer: Medicare Other | Attending: Critical Care Medicine | Admitting: Critical Care Medicine

## 2020-09-04 DIAGNOSIS — Z7902 Long term (current) use of antithrombotics/antiplatelets: Secondary | ICD-10-CM

## 2020-09-04 DIAGNOSIS — E162 Hypoglycemia, unspecified: Secondary | ICD-10-CM | POA: Diagnosis not present

## 2020-09-04 DIAGNOSIS — J90 Pleural effusion, not elsewhere classified: Secondary | ICD-10-CM

## 2020-09-04 DIAGNOSIS — E43 Unspecified severe protein-calorie malnutrition: Secondary | ICD-10-CM | POA: Diagnosis not present

## 2020-09-04 DIAGNOSIS — T508X5A Adverse effect of diagnostic agents, initial encounter: Secondary | ICD-10-CM | POA: Diagnosis not present

## 2020-09-04 DIAGNOSIS — I10 Essential (primary) hypertension: Secondary | ICD-10-CM | POA: Diagnosis not present

## 2020-09-04 DIAGNOSIS — G9340 Encephalopathy, unspecified: Secondary | ICD-10-CM

## 2020-09-04 DIAGNOSIS — S72142A Displaced intertrochanteric fracture of left femur, initial encounter for closed fracture: Secondary | ICD-10-CM | POA: Diagnosis not present

## 2020-09-04 DIAGNOSIS — R578 Other shock: Secondary | ICD-10-CM | POA: Diagnosis not present

## 2020-09-04 DIAGNOSIS — M79642 Pain in left hand: Secondary | ICD-10-CM | POA: Diagnosis not present

## 2020-09-04 DIAGNOSIS — I701 Atherosclerosis of renal artery: Secondary | ICD-10-CM | POA: Diagnosis not present

## 2020-09-04 DIAGNOSIS — Z888 Allergy status to other drugs, medicaments and biological substances status: Secondary | ICD-10-CM

## 2020-09-04 DIAGNOSIS — S62308A Unspecified fracture of other metacarpal bone, initial encounter for closed fracture: Secondary | ICD-10-CM | POA: Diagnosis present

## 2020-09-04 DIAGNOSIS — Z23 Encounter for immunization: Secondary | ICD-10-CM | POA: Diagnosis not present

## 2020-09-04 DIAGNOSIS — W19XXXA Unspecified fall, initial encounter: Secondary | ICD-10-CM | POA: Diagnosis not present

## 2020-09-04 DIAGNOSIS — I469 Cardiac arrest, cause unspecified: Secondary | ICD-10-CM | POA: Diagnosis not present

## 2020-09-04 DIAGNOSIS — E875 Hyperkalemia: Secondary | ICD-10-CM | POA: Diagnosis not present

## 2020-09-04 DIAGNOSIS — Y92009 Unspecified place in unspecified non-institutional (private) residence as the place of occurrence of the external cause: Secondary | ICD-10-CM

## 2020-09-04 DIAGNOSIS — Z79899 Other long term (current) drug therapy: Secondary | ICD-10-CM

## 2020-09-04 DIAGNOSIS — Z882 Allergy status to sulfonamides status: Secondary | ICD-10-CM

## 2020-09-04 DIAGNOSIS — J9811 Atelectasis: Secondary | ICD-10-CM | POA: Diagnosis not present

## 2020-09-04 DIAGNOSIS — E872 Acidosis, unspecified: Secondary | ICD-10-CM

## 2020-09-04 DIAGNOSIS — J9601 Acute respiratory failure with hypoxia: Secondary | ICD-10-CM

## 2020-09-04 DIAGNOSIS — S0990XA Unspecified injury of head, initial encounter: Secondary | ICD-10-CM | POA: Diagnosis not present

## 2020-09-04 DIAGNOSIS — G9349 Other encephalopathy: Secondary | ICD-10-CM | POA: Diagnosis not present

## 2020-09-04 DIAGNOSIS — D649 Anemia, unspecified: Secondary | ICD-10-CM | POA: Diagnosis present

## 2020-09-04 DIAGNOSIS — R0989 Other specified symptoms and signs involving the circulatory and respiratory systems: Secondary | ICD-10-CM | POA: Diagnosis not present

## 2020-09-04 DIAGNOSIS — E782 Mixed hyperlipidemia: Secondary | ICD-10-CM | POA: Diagnosis present

## 2020-09-04 DIAGNOSIS — R579 Shock, unspecified: Secondary | ICD-10-CM

## 2020-09-04 DIAGNOSIS — T82856A Stenosis of peripheral vascular stent, initial encounter: Secondary | ICD-10-CM | POA: Diagnosis not present

## 2020-09-04 DIAGNOSIS — L97429 Non-pressure chronic ulcer of left heel and midfoot with unspecified severity: Secondary | ICD-10-CM | POA: Diagnosis present

## 2020-09-04 DIAGNOSIS — R200 Anesthesia of skin: Secondary | ICD-10-CM | POA: Diagnosis not present

## 2020-09-04 DIAGNOSIS — N189 Chronic kidney disease, unspecified: Secondary | ICD-10-CM

## 2020-09-04 DIAGNOSIS — M25552 Pain in left hip: Secondary | ICD-10-CM | POA: Diagnosis not present

## 2020-09-04 DIAGNOSIS — D62 Acute posthemorrhagic anemia: Secondary | ICD-10-CM | POA: Diagnosis not present

## 2020-09-04 DIAGNOSIS — J918 Pleural effusion in other conditions classified elsewhere: Secondary | ICD-10-CM | POA: Diagnosis present

## 2020-09-04 DIAGNOSIS — R079 Chest pain, unspecified: Secondary | ICD-10-CM | POA: Diagnosis not present

## 2020-09-04 DIAGNOSIS — J449 Chronic obstructive pulmonary disease, unspecified: Secondary | ICD-10-CM | POA: Diagnosis present

## 2020-09-04 DIAGNOSIS — R52 Pain, unspecified: Secondary | ICD-10-CM | POA: Diagnosis not present

## 2020-09-04 DIAGNOSIS — R0600 Dyspnea, unspecified: Secondary | ICD-10-CM

## 2020-09-04 DIAGNOSIS — R0902 Hypoxemia: Secondary | ICD-10-CM | POA: Diagnosis not present

## 2020-09-04 DIAGNOSIS — I160 Hypertensive urgency: Secondary | ICD-10-CM | POA: Diagnosis not present

## 2020-09-04 DIAGNOSIS — Z9010 Acquired absence of unspecified breast and nipple: Secondary | ICD-10-CM

## 2020-09-04 DIAGNOSIS — D631 Anemia in chronic kidney disease: Secondary | ICD-10-CM | POA: Diagnosis present

## 2020-09-04 DIAGNOSIS — I130 Hypertensive heart and chronic kidney disease with heart failure and stage 1 through stage 4 chronic kidney disease, or unspecified chronic kidney disease: Secondary | ICD-10-CM | POA: Diagnosis present

## 2020-09-04 DIAGNOSIS — D696 Thrombocytopenia, unspecified: Secondary | ICD-10-CM | POA: Diagnosis not present

## 2020-09-04 DIAGNOSIS — S72002A Fracture of unspecified part of neck of left femur, initial encounter for closed fracture: Secondary | ICD-10-CM | POA: Diagnosis present

## 2020-09-04 DIAGNOSIS — E876 Hypokalemia: Secondary | ICD-10-CM | POA: Diagnosis present

## 2020-09-04 DIAGNOSIS — M1612 Unilateral primary osteoarthritis, left hip: Secondary | ICD-10-CM | POA: Diagnosis not present

## 2020-09-04 DIAGNOSIS — N141 Nephropathy induced by other drugs, medicaments and biological substances: Secondary | ICD-10-CM | POA: Diagnosis not present

## 2020-09-04 DIAGNOSIS — Z978 Presence of other specified devices: Secondary | ICD-10-CM

## 2020-09-04 DIAGNOSIS — R34 Anuria and oliguria: Secondary | ICD-10-CM | POA: Diagnosis present

## 2020-09-04 DIAGNOSIS — M542 Cervicalgia: Secondary | ICD-10-CM | POA: Diagnosis not present

## 2020-09-04 DIAGNOSIS — E8770 Fluid overload, unspecified: Secondary | ICD-10-CM | POA: Diagnosis not present

## 2020-09-04 DIAGNOSIS — I779 Disorder of arteries and arterioles, unspecified: Secondary | ICD-10-CM | POA: Diagnosis present

## 2020-09-04 DIAGNOSIS — R7301 Impaired fasting glucose: Secondary | ICD-10-CM | POA: Diagnosis present

## 2020-09-04 DIAGNOSIS — Z853 Personal history of malignant neoplasm of breast: Secondary | ICD-10-CM

## 2020-09-04 DIAGNOSIS — R680 Hypothermia, not associated with low environmental temperature: Secondary | ICD-10-CM | POA: Diagnosis not present

## 2020-09-04 DIAGNOSIS — S62337A Displaced fracture of neck of fifth metacarpal bone, left hand, initial encounter for closed fracture: Secondary | ICD-10-CM | POA: Diagnosis present

## 2020-09-04 DIAGNOSIS — R0602 Shortness of breath: Secondary | ICD-10-CM

## 2020-09-04 DIAGNOSIS — Z9889 Other specified postprocedural states: Secondary | ICD-10-CM

## 2020-09-04 DIAGNOSIS — W228XXA Striking against or struck by other objects, initial encounter: Secondary | ICD-10-CM | POA: Diagnosis present

## 2020-09-04 DIAGNOSIS — J939 Pneumothorax, unspecified: Secondary | ICD-10-CM

## 2020-09-04 DIAGNOSIS — J80 Acute respiratory distress syndrome: Secondary | ICD-10-CM | POA: Diagnosis not present

## 2020-09-04 DIAGNOSIS — J341 Cyst and mucocele of nose and nasal sinus: Secondary | ICD-10-CM | POA: Diagnosis not present

## 2020-09-04 DIAGNOSIS — Z4659 Encounter for fitting and adjustment of other gastrointestinal appliance and device: Secondary | ICD-10-CM

## 2020-09-04 DIAGNOSIS — F17210 Nicotine dependence, cigarettes, uncomplicated: Secondary | ICD-10-CM | POA: Diagnosis present

## 2020-09-04 DIAGNOSIS — M810 Age-related osteoporosis without current pathological fracture: Secondary | ICD-10-CM | POA: Diagnosis present

## 2020-09-04 DIAGNOSIS — I517 Cardiomegaly: Secondary | ICD-10-CM | POA: Diagnosis not present

## 2020-09-04 DIAGNOSIS — I5031 Acute diastolic (congestive) heart failure: Secondary | ICD-10-CM | POA: Diagnosis not present

## 2020-09-04 DIAGNOSIS — Z419 Encounter for procedure for purposes other than remedying health state, unspecified: Secondary | ICD-10-CM

## 2020-09-04 DIAGNOSIS — N179 Acute kidney failure, unspecified: Secondary | ICD-10-CM

## 2020-09-04 DIAGNOSIS — N170 Acute kidney failure with tubular necrosis: Secondary | ICD-10-CM | POA: Diagnosis not present

## 2020-09-04 DIAGNOSIS — I70222 Atherosclerosis of native arteries of extremities with rest pain, left leg: Secondary | ICD-10-CM | POA: Diagnosis present

## 2020-09-04 DIAGNOSIS — D72819 Decreased white blood cell count, unspecified: Secondary | ICD-10-CM | POA: Diagnosis not present

## 2020-09-04 DIAGNOSIS — Z20822 Contact with and (suspected) exposure to covid-19: Secondary | ICD-10-CM | POA: Diagnosis not present

## 2020-09-04 DIAGNOSIS — R001 Bradycardia, unspecified: Secondary | ICD-10-CM | POA: Diagnosis not present

## 2020-09-04 DIAGNOSIS — N184 Chronic kidney disease, stage 4 (severe): Secondary | ICD-10-CM | POA: Diagnosis present

## 2020-09-04 DIAGNOSIS — E874 Mixed disorder of acid-base balance: Secondary | ICD-10-CM | POA: Diagnosis not present

## 2020-09-04 DIAGNOSIS — Z923 Personal history of irradiation: Secondary | ICD-10-CM

## 2020-09-04 DIAGNOSIS — Z681 Body mass index (BMI) 19 or less, adult: Secondary | ICD-10-CM

## 2020-09-04 DIAGNOSIS — W010XXA Fall on same level from slipping, tripping and stumbling without subsequent striking against object, initial encounter: Secondary | ICD-10-CM | POA: Diagnosis present

## 2020-09-04 DIAGNOSIS — R0603 Acute respiratory distress: Secondary | ICD-10-CM

## 2020-09-04 DIAGNOSIS — Z452 Encounter for adjustment and management of vascular access device: Secondary | ICD-10-CM

## 2020-09-04 DIAGNOSIS — R54 Age-related physical debility: Secondary | ICD-10-CM | POA: Diagnosis present

## 2020-09-04 LAB — CBC WITH DIFFERENTIAL/PLATELET
Abs Immature Granulocytes: 0.04 10*3/uL (ref 0.00–0.07)
Basophils Absolute: 0 10*3/uL (ref 0.0–0.1)
Basophils Relative: 0 %
Eosinophils Absolute: 0 10*3/uL (ref 0.0–0.5)
Eosinophils Relative: 0 %
HCT: 29.2 % — ABNORMAL LOW (ref 36.0–46.0)
Hemoglobin: 9.8 g/dL — ABNORMAL LOW (ref 12.0–15.0)
Immature Granulocytes: 1 %
Lymphocytes Relative: 13 %
Lymphs Abs: 1 10*3/uL (ref 0.7–4.0)
MCH: 33.2 pg (ref 26.0–34.0)
MCHC: 33.6 g/dL (ref 30.0–36.0)
MCV: 99 fL (ref 80.0–100.0)
Monocytes Absolute: 0.6 10*3/uL (ref 0.1–1.0)
Monocytes Relative: 7 %
Neutro Abs: 6 10*3/uL (ref 1.7–7.7)
Neutrophils Relative %: 79 %
Platelets: 310 10*3/uL (ref 150–400)
RBC: 2.95 MIL/uL — ABNORMAL LOW (ref 3.87–5.11)
RDW: 14.4 % (ref 11.5–15.5)
WBC: 7.7 10*3/uL (ref 4.0–10.5)
nRBC: 0 % (ref 0.0–0.2)

## 2020-09-04 LAB — ABO/RH: ABO/RH(D): A POS

## 2020-09-04 LAB — BASIC METABOLIC PANEL
Anion gap: 14 (ref 5–15)
BUN: 38 mg/dL — ABNORMAL HIGH (ref 8–23)
CO2: 19 mmol/L — ABNORMAL LOW (ref 22–32)
Calcium: 7.4 mg/dL — ABNORMAL LOW (ref 8.9–10.3)
Chloride: 103 mmol/L (ref 98–111)
Creatinine, Ser: 2.63 mg/dL — ABNORMAL HIGH (ref 0.44–1.00)
GFR, Estimated: 18 mL/min — ABNORMAL LOW (ref >60)
Glucose, Bld: 113 mg/dL — ABNORMAL HIGH (ref 70–99)
Potassium: 3.3 mmol/L — ABNORMAL LOW (ref 3.5–5.1)
Sodium: 136 mmol/L (ref 135–145)

## 2020-09-04 LAB — RESP PANEL BY RT-PCR (FLU A&B, COVID) ARPGX2
Influenza A by PCR: NEGATIVE
Influenza B by PCR: NEGATIVE
SARS Coronavirus 2 by RT PCR: NEGATIVE

## 2020-09-04 LAB — PROTIME-INR
INR: 0.9 (ref 0.8–1.2)
Prothrombin Time: 11.8 seconds (ref 11.4–15.2)

## 2020-09-04 MED ORDER — HYDROMORPHONE HCL 1 MG/ML IJ SOLN
0.5000 mg | Freq: Once | INTRAMUSCULAR | Status: AC
  Administered 2020-09-04: 0.5 mg via INTRAVENOUS
  Filled 2020-09-04: qty 1

## 2020-09-04 NOTE — ED Provider Notes (Addendum)
[facility] EMERGENCY DEPARTMENT Provider Note   CSN: [id number] Arrival date & time: 09/08/2020  2149     History Chief Complaint  Patient presents with  . Fall    Fall on Plavix   . Hip Injury    Andrea Shepherd is a 86 y.o. female with a history of HTN, A. Fib, tobacco use disorder, CKD, PAD s/p femoral bypass and carotid endarterectomy, breast cancer s/p right lumpectomy in 2010 who presents the emergency department by [facility] from home with a chief complaint of fall.  The patient reports that just prior to arrival she was standing to get out of her chair when her left foot hit the leg on her chair, which caused her to fall onto her left side.  She denies loss of consciousness, headache, nausea, or vomiting.  She has not attempted to walk since the fall.  EMS reports that she was neurovascularly intact and she was given 100 mcg of fentanyl in route with interval improvement in her pain.  However, she reports that her pain has returned and is now 10 out of 10.  She is endorsing maximal pain in her left hip and thigh.  No known aggravating or alleviating factors.  She also endorses pain in her left pinky.  She denies neck pain, back pain, headache, visual changes, numbness, weakness, abdominal pain, chest pain, shortness of breath, or pain in the left elbow or wrist.  She does report that she was having pain in her left leg earlier today and applied Biofreeze to the area, which she will typically do when she is having pain related to PAD.  She is on Plavix.  She has A. fib, but denies other anticoagulation use.  She has been compliant with her home medications today, including her nighttime medications.  She last ate at approximately 18:30.   She continues to smoke 7 cigarettes a day.   The history is provided by the EMS personnel, the patient and medical records. No language interpreter was used.       Past Medical History:  Diagnosis Date  . Age-related  osteoporosis without current pathological fracture   . Essential (primary) hypertension   . History of malignant neoplasm of breast   . Impaired fasting glucose   . Mixed hyperlipidemia   . Occlusion and stenosis of bilateral carotid arteries     Patient Active Problem List   Diagnosis Date Noted  . Closed left hip fracture ([place]) 09/05/2020  . Hypertensive urgency 09/05/2020  . Hypokalemia 09/05/2020  . Normocytic anemia 09/05/2020  . Closed fracture of 5th metacarpal 09/05/2020  . Pleural effusion, bilateral 09/05/2020  . Atherosclerosis of native artery of both lower extremities with intermittent claudication ([place]) 08/08/2015  . Abdominal aortic aneurysm (AAA) ([place]) 07/08/2015  . Benign essential hypertension 07/08/2015  . Nicotine dependence 06/26/2015  . Peripheral arterial occlusive disease ([place]) 06/26/2015  . Anxiety 06/03/2015  . CKD (chronic kidney disease), stage IV ([place]) 06/03/2015  . Macular degeneration 06/03/2015  . High risk medication use 05/03/2015  . Peripheral arterial disease ([place]) 05/03/2015  . Atherosclerosis of artery of extremity with intermittent claudication ([place]) 04/05/2015  . Unspecified atherosclerosis 04/05/2015  . Carotid artery stenosis 05/25/2013    Past Surgical History:  Procedure Laterality Date  . BREAST LUMPECTOMY  2010   right lower  . CAROTID ENDARTERECTOMY     Left  . CATARACT EXTRACTION Right 2016  . FEMORAL ARTERY STENT Right    04/2015  . FEMORAL  BYPASS Bilateral    Left done 06/25/2015 and right done 07/2015  . left tumor removed from neck       OB History   No obstetric history on file.     Family History  Problem Relation Age of Onset  . Cancer Father   . Cancer Sister     Social History   Tobacco Use  . Smoking status: Current Every Day Smoker    Types: Cigarettes  . Smokeless tobacco: Never Used  . Tobacco comment: Patient states she smokes around 7 cigarettes daily.  Vaping Use  . Vaping Use: Never used   Substance Use Topics  . Alcohol use: Not Currently  . Drug use: Never    Home Medications Prior to Admission medications   Medication Sig Start Date End Date Taking? Authorizing Provider  acetaminophen (TYLENOL) 500 MG tablet Take 1,000 mg by mouth every 6 (six) hours as needed for moderate pain or headache.   Yes [provider]  acyclovir (ZOVIRAX) 400 MG tablet TAKE ONE TABLET BY MOUTH THREE TIMES DAILY Patient taking differently: Take 400 mg by mouth See admin instructions. Tid x 7 days. Once a month, takes at the beginning of the month 08/19/20  Yes Cox, Kirsten, MD  clopidogrel (PLAVIX) 75 MG tablet Take 1 tablet (75 mg total) by mouth daily. 06/21/20  Yes Rip Harbour, NP  diltiazem (TIAZAC) 120 MG 24 hr capsule Take 1 capsule (120 mg total) by mouth 2 (two) times daily. 06/21/20  Yes Rip Harbour, NP  hydroxypropyl methylcellulose / hypromellose (ISOPTO TEARS / GONIOVISC) 2.5 % ophthalmic solution Place 1 drop into both eyes daily.   Yes [provider]  simvastatin (ZOCOR) 20 MG tablet TAKE ONE TABLET BY MOUTH AT BEDTIME Patient taking differently: Take 20 mg by mouth at bedtime. 10/09/19  Yes Lillard Anes, MD  hydrochlorothiazide (MICROZIDE) 12.5 MG capsule Take 1 capsule (12.5 mg total) by mouth 2 (two) times daily. Patient not taking: No sig reported 07/22/20   Lillard Anes, MD  Vitamin D, Ergocalciferol, (DRISDOL) 1.25 MG (50000 UNIT) CAPS capsule Take 1 capsule (50,000 Units total) by mouth 2 (two) times a week. Take 1 capsule twice weekly Patient not taking: Reported on 08/23/2020 07/11/20   CoxElnita Maxwell, MD    Allergies    Sulfamethoxazole-trimethoprim and Nitrofurantoin  Review of Systems   Review of Systems  Constitutional: Negative for activity change, chills and fever.  HENT: Negative for congestion and sore throat.   Eyes: Negative for visual disturbance.  Respiratory: Negative for cough and shortness of breath.    Cardiovascular: Negative for chest pain.  Gastrointestinal: Negative for abdominal pain, nausea and vomiting.  Genitourinary: Negative for dysuria.  Musculoskeletal: Positive for arthralgias, gait problem and myalgias. Negative for back pain, neck pain and neck stiffness.  Skin: Negative for rash.  Allergic/Immunologic: Negative for immunocompromised state.  Neurological: Negative for dizziness, seizures, syncope, weakness, numbness and headaches.  Psychiatric/Behavioral: Negative for confusion.    Physical Exam Updated Vital Signs BP (!) 240/90   Pulse 98   Temp 98.2 F (36.8 C) (Oral)   Resp 15   Ht 5\' 8"  (1.727 m)   Wt 45.8 kg   SpO2 95%   BMI 15.36 kg/m   Physical Exam Vitals and nursing note reviewed.  Constitutional:      General: She is not in acute distress.    Comments: Thin, elderly female who appears uncomfortable and holding her left hip.  HENT:  Head: Normocephalic.  Eyes:     Conjunctiva/sclera: Conjunctivae normal.  Cardiovascular:     Rate and Rhythm: Normal rate. Rhythm irregularly irregular.     Heart sounds: No murmur heard. No friction rub. No gallop.   Pulmonary:     Effort: Pulmonary effort is normal. No respiratory distress.     Breath sounds: No stridor. No wheezing, rhonchi or rales.  Chest:     Chest wall: No tenderness.  Abdominal:     General: There is no distension.     Palpations: Abdomen is soft.     Comments: Hyperactive bowel sounds, but abdomen is soft, nontender, nondistended.  Musculoskeletal:        General: Deformity present.     Cervical back: Neck supple.     Comments: Obvious deformity to the left hip.  No tenderness to the left knee or ankle.  She has referred pain to the left hip with palpation of the right hip.  Pelvis is stable.  No tenderness to the right hip or ankle.  DP and PT pulses are 2+ and symmetric.  Sensation is intact and equal to the bilateral lower extremities.  Tender to palpation to the left pinky.   Full active and passive range of motion of the left wrist and elbow.  She is neurovascular intact to the bilateral upper extremities.  Spine is nontender without crepitus or step-offs.  Full active and passive range of motion of the neck.  Skin:    General: Skin is warm.     Findings: No rash.  Neurological:     Mental Status: She is alert.     Comments: Alert and oriented x3.  Decreased strength against resistance of the left lower leg secondary to pain.  Sensation is intact throughout.  No focal neurologic deficits.  Psychiatric:        Behavior: Behavior normal.     ED Results / Procedures / Treatments   Labs (all labs ordered are listed, but only abnormal results are displayed) Labs Reviewed  BASIC METABOLIC PANEL - Abnormal; Notable for the following components:      Result Value   Potassium 3.3 (*)    CO2 19 (*)    Glucose, Bld 113 (*)    BUN 38 (*)    Creatinine, Ser 2.63 (*)    Calcium 7.4 (*)    GFR, Estimated 18 (*)    All other components within normal limits  CBC WITH DIFFERENTIAL/PLATELET - Abnormal; Notable for the following components:   RBC 2.95 (*)    Hemoglobin 9.8 (*)    HCT 29.2 (*)    All other components within normal limits  RESP PANEL BY RT-PCR (FLU A&B, COVID) ARPGX2  PROTIME-INR  CBC  BASIC METABOLIC PANEL  MAGNESIUM  TYPE AND SCREEN  ABO/RH    EKG None  Radiology CT Head Wo Contrast  Result Date: 09/05/2020 CLINICAL DATA:  Fall, tripped on stool. Head trauma, minor (Age >= 65y) EXAM: CT HEAD WITHOUT CONTRAST TECHNIQUE: Contiguous axial images were obtained from the base of the skull through the vertex without intravenous contrast. COMPARISON:  Head CT 01/21/2016 FINDINGS: Brain: No intracranial hemorrhage, mass effect, or midline shift. Normal brain volume for age. Moderate periventricular and deep chronic small vessel ischemia. No hydrocephalus. The basilar cisterns are patent. No evidence of territorial infarct or acute ischemia. No  extra-axial or intracranial fluid collection. Vascular: Atherosclerosis of skullbase vasculature without hyperdense vessel or abnormal calcification. Skull: No fracture or focal lesion. Sinuses/Orbits: Resolved  right mastoid effusion. Paranasal sinuses and mastoid air cells are clear. Small mucous retention cyst left maxillary sinus. Bilateral lens extraction. Other: None. IMPRESSION: 1. No acute intracranial abnormality. No skull fracture. 2. Moderate chronic small vessel ischemia. Electronically Signed   By: Keith Rake M.D.   On: 09/01/2020 23:07   CT CERVICAL SPINE WO CONTRAST  Result Date: 09/06/2020 CLINICAL DATA:  Neck pain, chronic, no prior imaging Fall, trip on stool. EXAM: CT CERVICAL SPINE WITHOUT CONTRAST TECHNIQUE: Multidetector CT imaging of the cervical spine was performed without intravenous contrast. Multiplanar CT image reconstructions were also generated. COMPARISON:  None. FINDINGS: Alignment: No traumatic subluxation. Trace retrolisthesis of C3 on C4. Skull base and vertebrae: No acute fracture. Vertebral body heights are maintained. The dens and skull base are intact. Bone island within C7 vertebral body. Soft tissues and spinal canal: No prevertebral fluid or swelling. No visible canal hematoma. Disc levels: Diffuse degenerative disc disease, disc space narrowing is most prominent at C3-C4, C5-C6 and C6-C7. posterior disc osteophyte complex at C5-C6 and C4-C5 on the right causes narrowing of the vertebral foramen and mild mass effect on the canal. Upper chest: Layering pleural effusions, at least moderate in size. No apical pneumothorax. Other: Carotid calcifications. IMPRESSION: 1. Multilevel degenerative disc disease throughout the cervical spine without acute fracture or subluxation. 2. Posterior disc osteophyte complex at C5-C6 and C4-C5 on the right causes narrowing of the vertebral foramen and mild mass effect on the canal. 3. Layering pleural effusions at least moderate in  size. Electronically Signed   By: Keith Rake M.D.   On: 09/08/2020 23:11   DG Chest Port 1 View  Result Date: 08/16/2020 CLINICAL DATA:  Fall, hip fracture, hand fracture, chest pain EXAM: PORTABLE CHEST 1 VIEW COMPARISON:  06/21/2020 FINDINGS: The lungs are mildly hyperinflated. Small bilateral pleural effusions are present resulting in gradual basilar opacification on the right. No pneumothorax. Mild cardiomegaly is stable. Pulmonary vascularity is normal. Surgical clips are seen within the right axilla. No acute bone abnormality. IMPRESSION: Small bilateral pleural effusions. Stable cardiomegaly. COPD. Electronically Signed   By: Fidela Salisbury MD   On: 09/05/2020 22:57   DG Hand Complete Left  Result Date: 08/28/2020 CLINICAL DATA:  Fall, left hand pain EXAM: LEFT HAND - COMPLETE 3+ VIEW COMPARISON:  None. FINDINGS: Three view radiograph left hand demonstrates an acute, transverse, extra-articular fracture of the mid to distal diaphysis of the left fifth metacarpal with moderate volar angulation of the distal fracture fragment. No other fracture or dislocation identified. Joint spaces are preserved. Vascular calcifications are seen within the soft tissues. Soft tissue swelling is seen lateral to the fracture plane. IMPRESSION: Acute transverse angulated extra-articular fracture of the distal left fifth metacarpal diaphysis. Electronically Signed   By: Fidela Salisbury MD   On: 09/10/2020 22:55   DG Hip Unilat W or Wo Pelvis 2-3 Views Left  Result Date: 08/29/2020 CLINICAL DATA:  Fall, left hip deformity EXAM: DG HIP (WITH OR WITHOUT PELVIS) 2-3V LEFT COMPARISON:  06/05/2017 FINDINGS: There is an acute, intratrochanteric fracture of the left hip with marked medial and posterior angulation, avulsion of the lesser trochanter, and mild impaction. The femoral head is still seated within the left acetabulum. Mild superimposed left hip degenerative arthritis. The pelvis and right hip are intact.  Moderate right hip degenerative arthritis. Advanced vascular calcifications are seen within the pelvis and medial thighs bilaterally. Bilateral lower extremity arterial inflow stenting has been performed. Involuted fibroid noted within the pelvis. IMPRESSION:  Acute, impacted, markedly angulated intratrochanteric fracture of the left hip with avulsion of the lesser trochanter. Femoral head is still seated within the left acetabulum. Mild left hip degenerative arthritis. Electronically Signed   By: Fidela Salisbury MD   On: 09/03/2020 22:54    Procedures Procedures   Medications Ordered in ED Medications  fentaNYL (SUBLIMAZE) injection 50 mcg (has no administration in time range)  acetaminophen (TYLENOL) tablet 650 mg (has no administration in time range)  diltiazem (TIAZAC) 24 hr capsule 120 mg (has no administration in time range)  fentaNYL (SUBLIMAZE) injection 25-50 mcg (has no administration in time range)  senna-docusate (Senokot-S) tablet 1 tablet (has no administration in time range)  hydrALAZINE (APRESOLINE) tablet 25 mg (has no administration in time range)  potassium chloride SA (KLOR-CON) CR tablet 20 mEq (has no administration in time range)  methocarbamol (ROBAXIN) tablet 500 mg (has no administration in time range)  ondansetron (ZOFRAN) injection 4 mg (has no administration in time range)  simvastatin (ZOCOR) tablet 20 mg (has no administration in time range)  HYDROmorphone (DILAUDID) injection 0.5 mg (0.5 mg Intravenous Given 08/25/2020 2223)  Tdap (BOOSTRIX) injection 0.5 mL (0.5 mLs Intramuscular Given 09/05/20 0148)    ED Course  I have reviewed the triage vital signs and the nursing notes.  Pertinent labs & imaging results that were available during my care of the patient were reviewed by me and considered in my medical decision making (see chart for details).  Clinical Course as of 09/05/20 0153  Thu Sep 05, 2020  0015 Spoke with Dr. Erlinda Hong.  He recommends keeping the patient  n.p.o. after midnight as well as a medical admission.  Orthopedic surgery will round on the patient in the morning. [MM]    Clinical Course User Index [MM] Clay Solum A, PA-C   MDM Rules/Calculators/A&P                            CHA2DS2-VASc Score = 7  This indicates a 11.2% annual risk of stroke. The patient's score is based upon: CHF History: No HTN History: Yes Diabetes History: No Stroke History: Yes Vascular Disease History: Yes Age Score: 2 Gender Score: 3      86 year old female with a history of A. Fib, AAA, tobacco use disorder, CKD, PAD s/p femoral bypass and carotid endarterectomy who presents emergency department by [facility] from home with a chief complaint of mechanical fall.   Patient is markedly hypertensive on arrival.  No tachycardia.  Afebrile.  On exam, she has an obvious deformity to the left hip.  Left hand is swollen with bruising noted to the dorsum of the hand and fourth and fifth digits.  She is neurovascular intact throughout the exam.  No focal tenderness to the left near ankle.  The patient was discussed with Dr. Regenia Skeeter, attending physician.  Patient was given Dilaudid for pain control and blood pressure did start to downtrend until her pain returned.  She does endorse compliance with all of her home medications and is taken her nighttime antihypertensive agents.  She remains markedly hypertensive, but denies complaints associated with hypertensive urgency or emergency.  I suspect this is secondary to pain control.  We will give additional pain medication and continue to closely monitor.  Labs and imaging have been reviewed and independently interpreted by me. CT head and cervical spine are unremarkable.  X-ray of the left hands with extra-articular fracture of the distal left fifth  metacarpal diaphysis.  We will place the patient in an ulnar gutter splint.  X-ray of the pelvis with acute, impacted, markedly angulated intratrochanteric  fracture of the left hip with avulsion of the lesser trochanter.  Femoral head is still seated within the left acetabulum.  I spoke with Dr. Erlinda Hong, orthopedic surgery, who recommends making the patient n.p.o. after midnight.  Her chronic medical conditions, and Plavix usage were also discussed.  Orthopedics will see and evaluate the patient tomorrow.  Labs with stable creatinine at 2.63 from previous.  Hemoglobin is slightly decreased from previous at 9.8, but patient has no active bleeding at this time.  Bicarb is decreased to 19, but with elevated BUN of 38 this could be secondary to some mild dehydration.  She is having no respiratory complaints, but chest x-ray does show small bilateral pleural effusions.  However, this appears unchanged from chest x-ray performed on 06/21/20.   After updating the patient's on results and plan, I did ask if she would like to have family contacted.  She states that her husband is at home and they do not currently have a working phone at her home.  She requested in the morning to call the local country club and have them go check on her husband.  I offered to send nonemergent police out to her home tonight to update her husband, but she declined at this time.  Dr. Myna Hidalgo with hospitalist service has been made aware of this information.  Consult to the hospitalist service and Dr. Myna Hidalgo will accept the patient for admission. The patient appears reasonably stabilized for admission considering the current resources, flow, and capabilities available in the ED at this time, and I doubt any other [facility] requiring further screening and/or treatment in the ED prior to admission.  Final Clinical Impression(s) / ED Diagnoses Final diagnoses:  Fall from standing, initial encounter  Closed fracture of left hip, initial encounter ([place])  Closed displaced fracture of neck of fifth metacarpal bone of left hand, initial encounter    Rx / DC Orders ED Discharge Orders    None        Cranford Blessinger A, PA-C 09/05/20 0151    Roza Creamer A, PA-C 09/05/20 0152    Patrik Turnbaugh A, PA-C 09/05/20 0153    Sherwood Gambler, MD 09/05/20 2395

## 2020-09-04 NOTE — ED Triage Notes (Signed)
Pt BIB [facility]. EMS reports pt fell/tripped on stool - pt is on Plavix - pt did not hit her head. Pt has obvious deformity to L. Hip and bruises. Pt L pinky also swollen. Pt reports no abdominal pain, no nausea, and no dizziness. Pt has hx of afib - on Plavix and Cardiozem  Pt is hypertensive. EMS BP 220/96 Pt got 100 fentanyl with EMS.  PT AO, complaining of pain in hip/spasms

## 2020-09-05 ENCOUNTER — Encounter (HOSPITAL_COMMUNITY): Payer: Self-pay | Admitting: Family Medicine

## 2020-09-05 DIAGNOSIS — S72002A Fracture of unspecified part of neck of left femur, initial encounter for closed fracture: Secondary | ICD-10-CM | POA: Diagnosis present

## 2020-09-05 DIAGNOSIS — D72819 Decreased white blood cell count, unspecified: Secondary | ICD-10-CM | POA: Diagnosis not present

## 2020-09-05 DIAGNOSIS — I129 Hypertensive chronic kidney disease with stage 1 through stage 4 chronic kidney disease, or unspecified chronic kidney disease: Secondary | ICD-10-CM | POA: Diagnosis not present

## 2020-09-05 DIAGNOSIS — D696 Thrombocytopenia, unspecified: Secondary | ICD-10-CM | POA: Diagnosis not present

## 2020-09-05 DIAGNOSIS — S7292XA Unspecified fracture of left femur, initial encounter for closed fracture: Secondary | ICD-10-CM | POA: Diagnosis not present

## 2020-09-05 DIAGNOSIS — Z681 Body mass index (BMI) 19 or less, adult: Secondary | ICD-10-CM | POA: Diagnosis not present

## 2020-09-05 DIAGNOSIS — J449 Chronic obstructive pulmonary disease, unspecified: Secondary | ICD-10-CM | POA: Diagnosis present

## 2020-09-05 DIAGNOSIS — I779 Disorder of arteries and arterioles, unspecified: Secondary | ICD-10-CM

## 2020-09-05 DIAGNOSIS — N179 Acute kidney failure, unspecified: Secondary | ICD-10-CM | POA: Diagnosis not present

## 2020-09-05 DIAGNOSIS — J9811 Atelectasis: Secondary | ICD-10-CM | POA: Diagnosis not present

## 2020-09-05 DIAGNOSIS — E875 Hyperkalemia: Secondary | ICD-10-CM | POA: Diagnosis not present

## 2020-09-05 DIAGNOSIS — S62307A Unspecified fracture of fifth metacarpal bone, left hand, initial encounter for closed fracture: Secondary | ICD-10-CM | POA: Diagnosis not present

## 2020-09-05 DIAGNOSIS — R911 Solitary pulmonary nodule: Secondary | ICD-10-CM | POA: Diagnosis not present

## 2020-09-05 DIAGNOSIS — M62262 Nontraumatic ischemic infarction of muscle, left lower leg: Secondary | ICD-10-CM | POA: Diagnosis not present

## 2020-09-05 DIAGNOSIS — E876 Hypokalemia: Secondary | ICD-10-CM | POA: Diagnosis not present

## 2020-09-05 DIAGNOSIS — I160 Hypertensive urgency: Secondary | ICD-10-CM | POA: Diagnosis present

## 2020-09-05 DIAGNOSIS — D649 Anemia, unspecified: Secondary | ICD-10-CM | POA: Diagnosis not present

## 2020-09-05 DIAGNOSIS — N184 Chronic kidney disease, stage 4 (severe): Secondary | ICD-10-CM | POA: Diagnosis not present

## 2020-09-05 DIAGNOSIS — I517 Cardiomegaly: Secondary | ICD-10-CM | POA: Diagnosis not present

## 2020-09-05 DIAGNOSIS — Y92009 Unspecified place in unspecified non-institutional (private) residence as the place of occurrence of the external cause: Secondary | ICD-10-CM | POA: Diagnosis not present

## 2020-09-05 DIAGNOSIS — I70222 Atherosclerosis of native arteries of extremities with rest pain, left leg: Secondary | ICD-10-CM | POA: Diagnosis present

## 2020-09-05 DIAGNOSIS — F17210 Nicotine dependence, cigarettes, uncomplicated: Secondary | ICD-10-CM | POA: Diagnosis not present

## 2020-09-05 DIAGNOSIS — I5031 Acute diastolic (congestive) heart failure: Secondary | ICD-10-CM | POA: Diagnosis not present

## 2020-09-05 DIAGNOSIS — I70213 Atherosclerosis of native arteries of extremities with intermittent claudication, bilateral legs: Secondary | ICD-10-CM | POA: Diagnosis not present

## 2020-09-05 DIAGNOSIS — S72142A Displaced intertrochanteric fracture of left femur, initial encounter for closed fracture: Secondary | ICD-10-CM | POA: Diagnosis not present

## 2020-09-05 DIAGNOSIS — E872 Acidosis: Secondary | ICD-10-CM | POA: Diagnosis not present

## 2020-09-05 DIAGNOSIS — Z452 Encounter for adjustment and management of vascular access device: Secondary | ICD-10-CM | POA: Diagnosis not present

## 2020-09-05 DIAGNOSIS — E43 Unspecified severe protein-calorie malnutrition: Secondary | ICD-10-CM | POA: Diagnosis not present

## 2020-09-05 DIAGNOSIS — S62337A Displaced fracture of neck of fifth metacarpal bone, left hand, initial encounter for closed fracture: Secondary | ICD-10-CM

## 2020-09-05 DIAGNOSIS — R578 Other shock: Secondary | ICD-10-CM | POA: Diagnosis not present

## 2020-09-05 DIAGNOSIS — R918 Other nonspecific abnormal finding of lung field: Secondary | ICD-10-CM | POA: Diagnosis not present

## 2020-09-05 DIAGNOSIS — I469 Cardiac arrest, cause unspecified: Secondary | ICD-10-CM | POA: Diagnosis not present

## 2020-09-05 DIAGNOSIS — S72002D Fracture of unspecified part of neck of left femur, subsequent encounter for closed fracture with routine healing: Secondary | ICD-10-CM | POA: Diagnosis not present

## 2020-09-05 DIAGNOSIS — R0902 Hypoxemia: Secondary | ICD-10-CM | POA: Diagnosis not present

## 2020-09-05 DIAGNOSIS — J939 Pneumothorax, unspecified: Secondary | ICD-10-CM | POA: Diagnosis not present

## 2020-09-05 DIAGNOSIS — I130 Hypertensive heart and chronic kidney disease with heart failure and stage 1 through stage 4 chronic kidney disease, or unspecified chronic kidney disease: Secondary | ICD-10-CM | POA: Diagnosis present

## 2020-09-05 DIAGNOSIS — J918 Pleural effusion in other conditions classified elsewhere: Secondary | ICD-10-CM | POA: Diagnosis present

## 2020-09-05 DIAGNOSIS — R579 Shock, unspecified: Secondary | ICD-10-CM | POA: Diagnosis not present

## 2020-09-05 DIAGNOSIS — W010XXA Fall on same level from slipping, tripping and stumbling without subsequent striking against object, initial encounter: Secondary | ICD-10-CM | POA: Diagnosis present

## 2020-09-05 DIAGNOSIS — E874 Mixed disorder of acid-base balance: Secondary | ICD-10-CM | POA: Diagnosis not present

## 2020-09-05 DIAGNOSIS — Z9889 Other specified postprocedural states: Secondary | ICD-10-CM | POA: Diagnosis not present

## 2020-09-05 DIAGNOSIS — R062 Wheezing: Secondary | ICD-10-CM | POA: Diagnosis not present

## 2020-09-05 DIAGNOSIS — J9601 Acute respiratory failure with hypoxia: Secondary | ICD-10-CM | POA: Diagnosis not present

## 2020-09-05 DIAGNOSIS — N186 End stage renal disease: Secondary | ICD-10-CM | POA: Diagnosis not present

## 2020-09-05 DIAGNOSIS — S62308A Unspecified fracture of other metacarpal bone, initial encounter for closed fracture: Secondary | ICD-10-CM | POA: Diagnosis present

## 2020-09-05 DIAGNOSIS — G9349 Other encephalopathy: Secondary | ICD-10-CM | POA: Diagnosis not present

## 2020-09-05 DIAGNOSIS — J80 Acute respiratory distress syndrome: Secondary | ICD-10-CM | POA: Diagnosis not present

## 2020-09-05 DIAGNOSIS — Z4901 Encounter for fitting and adjustment of extracorporeal dialysis catheter: Secondary | ICD-10-CM | POA: Diagnosis not present

## 2020-09-05 DIAGNOSIS — T82856A Stenosis of peripheral vascular stent, initial encounter: Secondary | ICD-10-CM | POA: Diagnosis not present

## 2020-09-05 DIAGNOSIS — Z23 Encounter for immunization: Secondary | ICD-10-CM | POA: Diagnosis not present

## 2020-09-05 DIAGNOSIS — I7092 Chronic total occlusion of artery of the extremities: Secondary | ICD-10-CM | POA: Diagnosis not present

## 2020-09-05 DIAGNOSIS — L97429 Non-pressure chronic ulcer of left heel and midfoot with unspecified severity: Secondary | ICD-10-CM | POA: Diagnosis present

## 2020-09-05 DIAGNOSIS — N170 Acute kidney failure with tubular necrosis: Secondary | ICD-10-CM | POA: Diagnosis not present

## 2020-09-05 DIAGNOSIS — R0602 Shortness of breath: Secondary | ICD-10-CM | POA: Diagnosis not present

## 2020-09-05 DIAGNOSIS — W228XXA Striking against or struck by other objects, initial encounter: Secondary | ICD-10-CM | POA: Diagnosis present

## 2020-09-05 DIAGNOSIS — Z20822 Contact with and (suspected) exposure to covid-19: Secondary | ICD-10-CM | POA: Diagnosis not present

## 2020-09-05 DIAGNOSIS — J90 Pleural effusion, not elsewhere classified: Secondary | ICD-10-CM

## 2020-09-05 DIAGNOSIS — D631 Anemia in chronic kidney disease: Secondary | ICD-10-CM | POA: Diagnosis not present

## 2020-09-05 DIAGNOSIS — N189 Chronic kidney disease, unspecified: Secondary | ICD-10-CM | POA: Diagnosis not present

## 2020-09-05 DIAGNOSIS — N281 Cyst of kidney, acquired: Secondary | ICD-10-CM | POA: Diagnosis not present

## 2020-09-05 DIAGNOSIS — I701 Atherosclerosis of renal artery: Secondary | ICD-10-CM | POA: Diagnosis not present

## 2020-09-05 DIAGNOSIS — I714 Abdominal aortic aneurysm, without rupture: Secondary | ICD-10-CM | POA: Diagnosis not present

## 2020-09-05 LAB — BRAIN NATRIURETIC PEPTIDE: B Natriuretic Peptide: 702 pg/mL — ABNORMAL HIGH (ref 0.0–100.0)

## 2020-09-05 LAB — SURGICAL PCR SCREEN
MRSA, PCR: NEGATIVE
Staphylococcus aureus: NEGATIVE

## 2020-09-05 LAB — CBC
HCT: 28.3 % — ABNORMAL LOW (ref 36.0–46.0)
Hemoglobin: 9.3 g/dL — ABNORMAL LOW (ref 12.0–15.0)
MCH: 33.3 pg (ref 26.0–34.0)
MCHC: 32.9 g/dL (ref 30.0–36.0)
MCV: 101.4 fL — ABNORMAL HIGH (ref 80.0–100.0)
Platelets: 290 10*3/uL (ref 150–400)
RBC: 2.79 MIL/uL — ABNORMAL LOW (ref 3.87–5.11)
RDW: 14.4 % (ref 11.5–15.5)
WBC: 10.3 10*3/uL (ref 4.0–10.5)
nRBC: 0 % (ref 0.0–0.2)

## 2020-09-05 LAB — BASIC METABOLIC PANEL
Anion gap: 11 (ref 5–15)
BUN: 36 mg/dL — ABNORMAL HIGH (ref 8–23)
CO2: 19 mmol/L — ABNORMAL LOW (ref 22–32)
Calcium: 7.6 mg/dL — ABNORMAL LOW (ref 8.9–10.3)
Chloride: 108 mmol/L (ref 98–111)
Creatinine, Ser: 2.57 mg/dL — ABNORMAL HIGH (ref 0.44–1.00)
GFR, Estimated: 18 mL/min — ABNORMAL LOW (ref >60)
Glucose, Bld: 125 mg/dL — ABNORMAL HIGH (ref 70–99)
Potassium: 3.1 mmol/L — ABNORMAL LOW (ref 3.5–5.1)
Sodium: 138 mmol/L (ref 135–145)

## 2020-09-05 LAB — MAGNESIUM: Magnesium: 1.3 mg/dL — ABNORMAL LOW (ref 1.7–2.4)

## 2020-09-05 MED ORDER — MAGNESIUM SULFATE 4 GM/100ML IV SOLN
4.0000 g | Freq: Once | INTRAVENOUS | Status: AC
  Administered 2020-09-05: 4 g via INTRAVENOUS
  Filled 2020-09-05: qty 100

## 2020-09-05 MED ORDER — HYDRALAZINE HCL 20 MG/ML IJ SOLN
10.0000 mg | Freq: Once | INTRAMUSCULAR | Status: AC
  Administered 2020-09-05: 10 mg via INTRAVENOUS
  Filled 2020-09-05: qty 1

## 2020-09-05 MED ORDER — ONDANSETRON HCL 4 MG/2ML IJ SOLN
4.0000 mg | Freq: Four times a day (QID) | INTRAMUSCULAR | Status: DC | PRN
Start: 2020-09-05 — End: 2020-09-06
  Administered 2020-09-05: 4 mg via INTRAVENOUS
  Filled 2020-09-05: qty 2

## 2020-09-05 MED ORDER — SIMVASTATIN 20 MG PO TABS
20.0000 mg | ORAL_TABLET | Freq: Every day | ORAL | Status: DC
  Administered 2020-09-05 – 2020-09-09 (×4): 20 mg via ORAL
  Filled 2020-09-05 (×5): qty 1

## 2020-09-05 MED ORDER — METHOCARBAMOL 500 MG PO TABS
500.0000 mg | ORAL_TABLET | Freq: Three times a day (TID) | ORAL | Status: DC | PRN
  Administered 2020-09-05 (×2): 500 mg via ORAL
  Filled 2020-09-05 (×3): qty 1

## 2020-09-05 MED ORDER — DILTIAZEM HCL ER COATED BEADS 120 MG PO CP24
120.0000 mg | ORAL_CAPSULE | Freq: Two times a day (BID) | ORAL | Status: DC
  Administered 2020-09-05 – 2020-09-11 (×12): 120 mg via ORAL
  Filled 2020-09-05 (×15): qty 1

## 2020-09-05 MED ORDER — SIMVASTATIN 20 MG PO TABS
20.0000 mg | ORAL_TABLET | Freq: Every day | ORAL | Status: DC
  Filled 2020-09-05: qty 1

## 2020-09-05 MED ORDER — ACETAMINOPHEN 325 MG PO TABS
650.0000 mg | ORAL_TABLET | Freq: Once | ORAL | Status: AC
  Administered 2020-09-05: 650 mg via ORAL
  Filled 2020-09-05: qty 2

## 2020-09-05 MED ORDER — TETANUS-DIPHTH-ACELL PERTUSSIS 5-2.5-18.5 LF-MCG/0.5 IM SUSY
0.5000 mL | PREFILLED_SYRINGE | Freq: Once | INTRAMUSCULAR | Status: AC
  Administered 2020-09-05: 0.5 mL via INTRAMUSCULAR
  Filled 2020-09-05: qty 0.5

## 2020-09-05 MED ORDER — FENTANYL CITRATE (PF) 100 MCG/2ML IJ SOLN
25.0000 ug | INTRAMUSCULAR | Status: DC | PRN
  Administered 2020-09-06: 50 ug via INTRAVENOUS
  Filled 2020-09-05 (×2): qty 2

## 2020-09-05 MED ORDER — POTASSIUM CHLORIDE CRYS ER 20 MEQ PO TBCR
40.0000 meq | EXTENDED_RELEASE_TABLET | Freq: Once | ORAL | Status: AC
  Administered 2020-09-05: 40 meq via ORAL
  Filled 2020-09-05: qty 2

## 2020-09-05 MED ORDER — SENNOSIDES-DOCUSATE SODIUM 8.6-50 MG PO TABS: 1.0000 | ORAL_TABLET | Freq: Every evening | ORAL | Status: DC | PRN

## 2020-09-05 MED ORDER — ACETAMINOPHEN 500 MG PO TABS
500.0000 mg | ORAL_TABLET | Freq: Three times a day (TID) | ORAL | Status: DC | PRN
Start: 2020-09-05 — End: 2020-09-06
  Administered 2020-09-05: 1000 mg via ORAL
  Filled 2020-09-05: qty 2

## 2020-09-05 MED ORDER — HYDRALAZINE HCL 25 MG PO TABS
25.0000 mg | ORAL_TABLET | Freq: Three times a day (TID) | ORAL | Status: DC
  Administered 2020-09-05 – 2020-09-06 (×3): 25 mg via ORAL
  Filled 2020-09-05 (×3): qty 1

## 2020-09-05 MED ORDER — POTASSIUM CHLORIDE CRYS ER 20 MEQ PO TBCR
20.0000 meq | EXTENDED_RELEASE_TABLET | Freq: Once | ORAL | Status: AC
  Administered 2020-09-05: 20 meq via ORAL
  Filled 2020-09-05: qty 1

## 2020-09-05 MED ORDER — FENTANYL CITRATE (PF) 100 MCG/2ML IJ SOLN
50.0000 ug | INTRAMUSCULAR | Status: AC | PRN
  Administered 2020-09-05 (×2): 50 ug via INTRAVENOUS
  Filled 2020-09-05: qty 2

## 2020-09-05 MED ORDER — LABETALOL HCL 5 MG/ML IV SOLN
10.0000 mg | Freq: Once | INTRAVENOUS | Status: DC
  Filled 2020-09-05: qty 4

## 2020-09-05 MED ORDER — HYDRALAZINE HCL 25 MG PO TABS
25.0000 mg | ORAL_TABLET | Freq: Four times a day (QID) | ORAL | Status: DC | PRN
  Administered 2020-09-08 (×2): 25 mg via ORAL
  Filled 2020-09-05 (×3): qty 1

## 2020-09-05 NOTE — H&P (Signed)
History and Physical    [name] EXB:[id number] DOB: May 06, 1934 DOA: 08/26/2020  PCP: Rochel Brome, MD   Patient coming from: Home   Chief Complaint: fall with left hip and hand pain   HPI: Andrea Shepherd is a 86 y.o. female with medical history significant for peripheral arterial disease, hypertension, chronic kidney disease stage IV, and current smoker, now presenting to the emergency department for evaluation of severe left hip and hand pain after a fall at home.  The patient reports that she had been in her usual state of health and was having an uneventful day when she tripped over a piece of furniture at home, landing on her left side.  She was experiencing severe pain involving the proximal left leg with any movement and family called EMS.  She also noted some pain and swelling involving the ulnar aspect of the left hand.  She walks unassisted at baseline but has had trouble getting up stairs in the last few months due to leg pain.  She never experiences chest pain with activity.  She denies any recent fevers, chills, cough, or shortness of breath.  She continues to smoke approximately 7 cigarettes each day but is planning to quit again.  She reports that her blood pressure is always elevated when she is in a doctor office or hospital.  ED Course: Upon arrival to the ED, patient is found to be afebrile, saturating well on room air, and with blood pressure in the 102V systolic.  EKG features sinus or ectopic atrial rhythm.  Chest x-ray with stable bilateral pleural effusions, stable cardiomegaly, and COPD changes.  Head CT is negative for acute intracranial abnormality or skull fracture and there is no acute fracture or subluxation on CT cervical spine.  X-ray of the left hand demonstrates acute fracture involving, extra-articular distal left fifth metacarpal diaphysis.  Chemistry panel notable for potassium 3.3, bicarbonate 19, and creatinine 2.63.  CBC with hemoglobin of 9.8.  Orthopedic surgery  was consulted by the ED physician and the patient was treated with fentanyl, Dilaudid, and acetaminophen.  Review of Systems:  All other systems reviewed and apart from HPI, are negative.  Past Medical History:  Diagnosis Date  . Age-related osteoporosis without current pathological fracture   . Essential (primary) hypertension   . History of malignant neoplasm of breast   . Impaired fasting glucose   . Mixed hyperlipidemia   . Occlusion and stenosis of bilateral carotid arteries     Past Surgical History:  Procedure Laterality Date  . BREAST LUMPECTOMY  2010   right lower  . CAROTID ENDARTERECTOMY     Left  . CATARACT EXTRACTION Right 2016  . FEMORAL ARTERY STENT Right    04/2015  . FEMORAL BYPASS Bilateral    Left done 06/25/2015 and right done 07/2015  . left tumor removed from neck      Social History:   reports that she has been smoking cigarettes. She has never used smokeless tobacco. She reports previous alcohol use. She reports that she does not use drugs.  Allergies  Allergen Reactions  . Sulfamethoxazole-Trimethoprim Diarrhea and Other (See Comments)    UNKNOWN REACTION   . Nitrofurantoin Diarrhea and Other (See Comments)    UNKNOWN REACTION     Family History  Problem Relation Age of Onset  . Cancer Father   . Cancer Sister      Prior to Admission medications   Medication Sig Start Date End Date Taking? Authorizing Provider  acetaminophen (TYLENOL) 500  MG tablet Take 1,000 mg by mouth every 6 (six) hours as needed for moderate pain or headache.   Yes [provider]  acyclovir (ZOVIRAX) 400 MG tablet TAKE ONE TABLET BY MOUTH THREE TIMES DAILY Patient taking differently: Take 400 mg by mouth See admin instructions. Tid x 7 days. Once a month, takes at the beginning of the month 08/19/20  Yes Cox, Kirsten, MD  clopidogrel (PLAVIX) 75 MG tablet Take 1 tablet (75 mg total) by mouth daily. 06/21/20  Yes Rip Harbour, NP  diltiazem (TIAZAC) 120 MG  24 hr capsule Take 1 capsule (120 mg total) by mouth 2 (two) times daily. 06/21/20  Yes Rip Harbour, NP  hydroxypropyl methylcellulose / hypromellose (ISOPTO TEARS / GONIOVISC) 2.5 % ophthalmic solution Place 1 drop into both eyes daily.   Yes [provider]  simvastatin (ZOCOR) 20 MG tablet TAKE ONE TABLET BY MOUTH AT BEDTIME Patient taking differently: Take 20 mg by mouth at bedtime. 10/09/19  Yes Lillard Anes, MD  hydrochlorothiazide (MICROZIDE) 12.5 MG capsule Take 1 capsule (12.5 mg total) by mouth 2 (two) times daily. Patient not taking: No sig reported 07/22/20   Lillard Anes, MD  Vitamin D, Ergocalciferol, (DRISDOL) 1.25 MG (50000 UNIT) CAPS capsule Take 1 capsule (50,000 Units total) by mouth 2 (two) times a week. Take 1 capsule twice weekly Patient not taking: Reported on 08/12/2020 07/11/20   Rochel Brome, MD    Physical Exam: Vitals:   08/19/2020 2300 09/03/2020 2315 08/15/2020 2345 09/05/20 0030  BP: (!) 229/91 (!) 205/89 (!) 225/83 (!) 240/90  Pulse: (!) 102 (!) 103 62 98  Resp: 17 (!) 24 (!) 28 15  Temp:      TempSrc:      SpO2: 95% 95% 97% 95%  Weight:      Height:        Constitutional: NAD, calm  Eyes: PERTLA, lids and conjunctivae normal ENMT: Mucous membranes are moist. Posterior pharynx clear of any exudate or lesions.   Neck: supple, no masses  Respiratory: no wheezing, no crackles. No accessory muscle use.  Cardiovascular: S1 & S2 heard, regular rate and rhythm. No extremity edema.   Abdomen: No distension, no tenderness, soft. Bowel sounds active.  Musculoskeletal: no clubbing / cyanosis. Left hip tenderness, neurovascularly intact distally. Left hand swelling.   Skin: no significant rashes, lesions, ulcers. Warm, dry, well-perfused. Neurologic: CN 2-12 grossly intact. Sensation intact. Moving all extremities.  Psychiatric: Alert and oriented to person, place, and situation. Pleasant and cooperative.    Labs and Imaging on  Admission: I have personally reviewed following labs and imaging studies  CBC: Recent Labs  Lab 08/23/2020 2205  WBC 7.7  NEUTROABS 6.0  HGB 9.8*  HCT 29.2*  MCV 99.0  PLT 373   Basic Metabolic Panel: Recent Labs  Lab 08/31/2020 2205  NA 136  K 3.3*  CL 103  CO2 19*  GLUCOSE 113*  BUN 38*  CREATININE 2.63*  CALCIUM 7.4*   GFR: Estimated Creatinine Clearance: 11.7 mL/min (A) (by C-G formula based on SCr of 2.63 mg/dL (H)). Liver Function Tests: No results for input(s): AST, ALT, ALKPHOS, BILITOT, PROT, ALBUMIN in the last 168 hours. No results for input(s): LIPASE, AMYLASE in the last 168 hours. No results for input(s): AMMONIA in the last 168 hours. Coagulation Profile: Recent Labs  Lab 09/02/2020 2205  INR 0.9   Cardiac Enzymes: No results for input(s): CKTOTAL, CKMB, CKMBINDEX, TROPONINI in the last 168 hours. BNP (  last 3 results) No results for input(s): PROBNP in the last 8760 hours. HbA1C: No results for input(s): HGBA1C in the last 72 hours. CBG: No results for input(s): GLUCAP in the last 168 hours. Lipid Profile: No results for input(s): CHOL, HDL, LDLCALC, TRIG, CHOLHDL, LDLDIRECT in the last 72 hours. Thyroid Function Tests: No results for input(s): TSH, T4TOTAL, FREET4, T3FREE, THYROIDAB in the last 72 hours. Anemia Panel: No results for input(s): VITAMINB12, FOLATE, FERRITIN, TIBC, IRON, RETICCTPCT in the last 72 hours. Urine analysis: No results found for: COLORURINE, APPEARANCEUR, LABSPEC, PHURINE, GLUCOSEU, HGBUR, BILIRUBINUR, KETONESUR, PROTEINUR, UROBILINOGEN, NITRITE, LEUKOCYTESUR Sepsis Labs: @LABRCNTIP (procalcitonin:4,lacticidven:4) ) Recent Results (from the past 240 hour(s))  Resp Panel by RT-PCR (Flu A&B, Covid) Nasopharyngeal Swab     Status: None   Collection Time: 08/20/2020 10:15 PM   Specimen: Nasopharyngeal Swab; Nasopharyngeal(NP) swabs in vial transport medium  Result Value Ref Range Status   SARS Coronavirus 2 by RT PCR NEGATIVE  NEGATIVE Final    Comment: (NOTE) SARS-CoV-2 target nucleic acids are NOT DETECTED.  The SARS-CoV-2 RNA is generally detectable in upper respiratory specimens during the acute phase of infection. The lowest concentration of SARS-CoV-2 viral copies this assay can detect is 138 copies/mL. A negative result does not preclude SARS-Cov-2 infection and should not be used as the sole basis for treatment or other patient management decisions. A negative result may occur with  improper specimen collection/handling, submission of specimen other than nasopharyngeal swab, presence of viral mutation(s) within the areas targeted by this assay, and inadequate number of viral copies(<138 copies/mL). A negative result must be combined with clinical observations, patient history, and epidemiological information. The expected result is Negative.  Fact Sheet for Patients:  EntrepreneurPulse.com.au  Fact Sheet for Healthcare Providers:  IncredibleEmployment.be  This test is no t yet approved or cleared by the [place] FDA and  has been authorized for detection and/or diagnosis of SARS-CoV-2 by FDA under an Emergency Use Authorization (EUA). This EUA will remain  in effect (meaning this test can be used) for the duration of the COVID-19 declaration under Section 564(b)(1) of the Act, 21 U.S.C.section 360bbb-3(b)(1), unless the authorization is terminated  or revoked sooner.       Influenza A by PCR NEGATIVE NEGATIVE Final   Influenza B by PCR NEGATIVE NEGATIVE Final    Comment: (NOTE) The Xpert Xpress SARS-CoV-2/FLU/RSV plus assay is intended as an aid in the diagnosis of influenza from Nasopharyngeal swab specimens and should not be used as a sole basis for treatment. Nasal washings and aspirates are unacceptable for Xpert Xpress SARS-CoV-2/FLU/RSV testing.  Fact Sheet for Patients: EntrepreneurPulse.com.au  Fact Sheet for Healthcare  Providers: IncredibleEmployment.be  This test is not yet approved or cleared by the [place] FDA and has been authorized for detection and/or diagnosis of SARS-CoV-2 by FDA under an Emergency Use Authorization (EUA). This EUA will remain in effect (meaning this test can be used) for the duration of the COVID-19 declaration under Section 564(b)(1) of the Act, 21 U.S.C. section 360bbb-3(b)(1), unless the authorization is terminated or revoked.  Performed at [facility], [place] [address] [place], [place] [address]      Radiological Exams on Admission: CT Head Wo Contrast  Result Date: 08/24/2020 CLINICAL DATA:  Fall, tripped on stool. Head trauma, minor (Age >= 65y) EXAM: CT HEAD WITHOUT CONTRAST TECHNIQUE: Contiguous axial images were obtained from the base of the skull through the vertex without intravenous contrast. COMPARISON:  Head CT 01/21/2016 FINDINGS: Brain:  No intracranial hemorrhage, mass effect, or midline shift. Normal brain volume for age. Moderate periventricular and deep chronic small vessel ischemia. No hydrocephalus. The basilar cisterns are patent. No evidence of territorial infarct or acute ischemia. No extra-axial or intracranial fluid collection. Vascular: Atherosclerosis of skullbase vasculature without hyperdense vessel or abnormal calcification. Skull: No fracture or focal lesion. Sinuses/Orbits: Resolved right mastoid effusion. Paranasal sinuses and mastoid air cells are clear. Small mucous retention cyst left maxillary sinus. Bilateral lens extraction. Other: None. IMPRESSION: 1. No acute intracranial abnormality. No skull fracture. 2. Moderate chronic small vessel ischemia. Electronically Signed   By: Keith Rake M.D.   On: 08/15/2020 23:07   CT CERVICAL SPINE WO CONTRAST  Result Date: 08/15/2020 CLINICAL DATA:  Neck pain, chronic, no prior imaging Fall, trip on stool. EXAM: CT CERVICAL SPINE WITHOUT CONTRAST TECHNIQUE: Multidetector  CT imaging of the cervical spine was performed without intravenous contrast. Multiplanar CT image reconstructions were also generated. COMPARISON:  None. FINDINGS: Alignment: No traumatic subluxation. Trace retrolisthesis of C3 on C4. Skull base and vertebrae: No acute fracture. Vertebral body heights are maintained. The dens and skull base are intact. Bone island within C7 vertebral body. Soft tissues and spinal canal: No prevertebral fluid or swelling. No visible canal hematoma. Disc levels: Diffuse degenerative disc disease, disc space narrowing is most prominent at C3-C4, C5-C6 and C6-C7. posterior disc osteophyte complex at C5-C6 and C4-C5 on the right causes narrowing of the vertebral foramen and mild mass effect on the canal. Upper chest: Layering pleural effusions, at least moderate in size. No apical pneumothorax. Other: Carotid calcifications. IMPRESSION: 1. Multilevel degenerative disc disease throughout the cervical spine without acute fracture or subluxation. 2. Posterior disc osteophyte complex at C5-C6 and C4-C5 on the right causes narrowing of the vertebral foramen and mild mass effect on the canal. 3. Layering pleural effusions at least moderate in size. Electronically Signed   By: Keith Rake M.D.   On: 09/01/2020 23:11   DG Chest Port 1 View  Result Date: 08/11/2020 CLINICAL DATA:  Fall, hip fracture, hand fracture, chest pain EXAM: PORTABLE CHEST 1 VIEW COMPARISON:  06/21/2020 FINDINGS: The lungs are mildly hyperinflated. Small bilateral pleural effusions are present resulting in gradual basilar opacification on the right. No pneumothorax. Mild cardiomegaly is stable. Pulmonary vascularity is normal. Surgical clips are seen within the right axilla. No acute bone abnormality. IMPRESSION: Small bilateral pleural effusions. Stable cardiomegaly. COPD. Electronically Signed   By: Fidela Salisbury MD   On: 08/24/2020 22:57   DG Hand Complete Left  Result Date: 08/27/2020 CLINICAL DATA:   Fall, left hand pain EXAM: LEFT HAND - COMPLETE 3+ VIEW COMPARISON:  None. FINDINGS: Three view radiograph left hand demonstrates an acute, transverse, extra-articular fracture of the mid to distal diaphysis of the left fifth metacarpal with moderate volar angulation of the distal fracture fragment. No other fracture or dislocation identified. Joint spaces are preserved. Vascular calcifications are seen within the soft tissues. Soft tissue swelling is seen lateral to the fracture plane. IMPRESSION: Acute transverse angulated extra-articular fracture of the distal left fifth metacarpal diaphysis. Electronically Signed   By: Fidela Salisbury MD   On: 08/19/2020 22:55   DG Hip Unilat W or Wo Pelvis 2-3 Views Left  Result Date: 08/26/2020 CLINICAL DATA:  Fall, left hip deformity EXAM: DG HIP (WITH OR WITHOUT PELVIS) 2-3V LEFT COMPARISON:  06/05/2017 FINDINGS: There is an acute, intratrochanteric fracture of the left hip with marked medial and posterior angulation, avulsion of the  lesser trochanter, and mild impaction. The femoral head is still seated within the left acetabulum. Mild superimposed left hip degenerative arthritis. The pelvis and right hip are intact. Moderate right hip degenerative arthritis. Advanced vascular calcifications are seen within the pelvis and medial thighs bilaterally. Bilateral lower extremity arterial inflow stenting has been performed. Involuted fibroid noted within the pelvis. IMPRESSION: Acute, impacted, markedly angulated intratrochanteric fracture of the left hip with avulsion of the lesser trochanter. Femoral head is still seated within the left acetabulum. Mild left hip degenerative arthritis. Electronically Signed   By: Fidela Salisbury MD   On: 08/25/2020 22:54    EKG: Independently reviewed. Sinus or ectopic atrial rhythm.   Assessment/Plan   1. Left hip fracture  - Presents with severe left hip pain after a fall at home and is found to have acute intertrochanteric fracture   - Orthopedic surgery consulting and much appreciated  - Based on the available data, Andrea Shepherd presents an estimated 2% risk of perioperative MI or cardiac arrest  - Hold Plavix, continue NPO, pain-control, and CMS checks   2. Left 5th metacarpal fracture  - Immobilized in ED  - Continue pain-control, elevation, follow-up ortho recommendations    3. Severe asymptomatic hypertension  - SBP 200s in ED without evidence for acute end-organ damage  - Continue pain-control, continue diltiazem, use hydralazine as needed    4. CKD IV  - SCr is 2.63 on admission, same as in March 2022  - Renally-dose medications, monitor    5. PAD  - No acute ischemia  - Hold Plavix for likely surgery   6. Anemia  - Hgb is 9.8 on admission  - No overt bleeding, type & screen and monitor    7. Hypokalemia  - Serum potassium 3.3, will give supplemental potassium    8. Pleural effusions  - Appear stable on CXR and asymptomatic  - Could consider diagnostic thoracentesis    DVT prophylaxis: SCDs  Code Status: Full  Level of Care: Level of care: Telemetry Medical Family Communication: none present; no home phone  Disposition Plan:  Patient is from: Home  Anticipated d/c is to: TBD Anticipated d/c date is: 09/08/20 Patient currently: Pending orthopedic surgery consultation, likely surgery  Consults called: Orthopedic surgery Admission status: Inpatient     Vianne Bulls, MD [facility]  09/05/2020, 2:15 AM

## 2020-09-05 NOTE — Progress Notes (Signed)
   09/05/20 2159  Assess: MEWS Score  Temp 98.4 F (36.9 C)  BP (!) 211/52  Pulse Rate 81  Resp 17  Level of Consciousness Alert  SpO2 94 %  O2 Device Room Air  Assess: MEWS Score  MEWS Temp 0  MEWS Systolic 2  MEWS Pulse 0  MEWS RR 0  MEWS LOC 0  MEWS Score 2  MEWS Score Color Yellow  Assess: if the MEWS score is Yellow or Red  Were vital signs taken at a resting state? Yes  Focused Assessment No change from prior assessment  Early Detection of Sepsis Score *See Row Information* Low  MEWS guidelines implemented *See Row Information* Yes  Treat  MEWS Interventions Administered scheduled meds/treatments  Pain Scale 0-10  Pain Score 0  Take Vital Signs  Increase Vital Sign Frequency  Yellow: Q 2hr X 2 then Q 4hr X 2, if remains yellow, continue Q 4hrs  Escalate  MEWS: Escalate Yellow: discuss with charge nurse/RN and consider discussing with provider and RRT  Notify: Charge Nurse/RN  Name of Charge Nurse/RN Notified Joyce,RN  Date Charge Nurse/RN Notified 09/05/20  Time Charge Nurse/RN Notified 2210  Notify: Provider  Provider Name/Title Haynes Hoehn  Date Provider Notified 09/05/20  Time Provider Notified 2232  Notification Type Page  Notification Reason Critical result  Provider response Other (Comment)  Date of Provider Response 09/05/20  Time of Provider Response 2233  Document  Patient Outcome Other (Comment)  Progress note created (see row info) Yes  Pt denies pain. RN will continue to monitor patient.

## 2020-09-05 NOTE — ED Notes (Signed)
Attempt report x1  

## 2020-09-05 NOTE — TOC CAGE-AID Note (Signed)
Transition of Care [facility]) - CAGE-AID Screening   Patient Details  Name: Andrea Shepherd MRN: [id number] Date of Birth: 1934/08/10   Elvina Sidle, RN  Trauma Response Nurse Phone Number: [phone] 09/05/2020, 9:47 AM   Clinical Narrative:I: Andrea Shepherd was at home and was getting up from the kitchen table to take her pills when she caught her toe on the table leg and fell. She had immediate left hand and hip pain and could not get up. She was brought to the ED where x-rays showed a left hip fx and left 5th MC fx and orthopedic surgery was consulted. She lives at home with her husband and does not use any assistive devices to ambulate.    CAGE-AID Screening:    Have You Ever Felt You Ought to Cut Down on Your Drinking or Drug Use?: No Have People Annoyed You By Critizing Your Drinking Or Drug Use?: No Have You Felt Bad Or Guilty About Your Drinking Or Drug Use?: No Have You Ever Had a Drink or Used Drugs First Thing In The Morning to Steady Your Nerves or to Get Rid of a Hangover?: No CAGE-AID Score: 0  Substance Abuse Education Offered: No (pt admits to drinking "1 and 1/2 drinks of Gin and water every night" declines any services.)

## 2020-09-05 NOTE — ED Notes (Signed)
This RN went in to give pt meds. Right before pt was given meds she felt the need to vomit. Pt states she felt sick to stomach and then vomited. Will notify MD and ask for meds.

## 2020-09-05 NOTE — Plan of Care (Signed)
  Problem: Clinical Measurements: Goal: Will remain free from infection Outcome: Progressing   Problem: Activity: Goal: Risk for activity intolerance will decrease Outcome: Progressing   

## 2020-09-05 NOTE — Progress Notes (Signed)
PROGRESS NOTE    Andrea Shepherd  EYC:[id number] DOB: [birth year] DOA: 08/26/2020 PCP: Rochel Brome, MD   Brief Narrative: Andrea Shepherd is a 86 y.o. female with a history of PAD, hypertension, CKD stage IV, tobacco use, breast cancer status postmastectomy/radiation.  Patient presented secondary to a fall after getting up out of her and stubbing her toe on a chair.  She is found to have a left hip fracture.  Orthopedic surgery was consulted on admission.    Assessment & Plan:   Principal Problem:   Closed left hip fracture (HCC) Active Problems:   CKD (chronic kidney disease), stage IV (HCC)   Peripheral arterial occlusive disease (HCC)   Hypertensive urgency   Hypokalemia   Normocytic anemia   Closed fracture of 5th metacarpal   Pleural effusion, bilateral   Left hip fracture Orthopedic surgery consulted with plan for surgery on 5/27. -Tylenol prn for pain -Orthopedic surgery recommendations: OR tomorrow  Left 5th metacarpal fracture Secondary to fall. Ulnar gutter splint placed in the ED -Orthopedic surgery recommendations: continue splint  Hypertensive urgency Patient is on diltiazem as an outpatient.  Blood pressure continues to be out of control. -Continue Cardizem CD 120 mg daily -Start hydralazine 25 mg TID and titrate up for control  Bilateral pleural effusion Unknown etiology.  Patient with recent CT scan with small volume effusions at that time contributed to underlying COPD.  Concerning with patient's history of breast cancer in the past.  Patient is mildly symptomatic with mild cough and orthopnea. -Will order BNP.  Will likely benefit from thoracentesis to attempt to identify source of effusion.  CKD stage IV Baseline creatinine of about 2.6.  Creatinine is currently stable  PAD Claudication Patient has a history of femoral bypass bilaterally with right femoral artery stent placed.  Patient is on Plavix as an outpatient.  She is also on simvastatin. -Resume  home simvastatin -Holding Plavix secondary to anticipated surgery; resume pending orthopedic surgery recommendations   DVT prophylaxis: SCDs Code Status:   Code Status: Full Code Family Communication: None at bedside Disposition Plan: Discharge likely in 3-5 days pending orthopedic surgery management and eventual PT/OT recommendations   Consultants:   Orthopedic surgery  Procedures:   None  Antimicrobials:  None    Subjective: Pain of her left leg. No other issues at this time.  Objective: Vitals:   09/05/20 0348 09/05/20 0400 09/05/20 0500 09/05/20 0600  BP: (!) 164/98 (!) 187/84 (!) 177/50 (!) 192/78  Pulse: 84 80 72 83  Resp: 16 14 12 14   Temp: 98.7 F (37.1 C)     TempSrc: Oral     SpO2: 95% 96% 94% 93%  Weight:      Height:       No intake or output data in the 24 hours ending 09/05/20 0927 Filed Weights   08/22/2020 2208  Weight: 45.8 kg    Examination:  General exam: Appears calm and comfortable Respiratory system: Diminished. Respiratory effort normal. Cardiovascular system: S1 & S2 heard, RRR. No murmurs, rubs, gallops or clicks. Gastrointestinal system: Abdomen is nondistended, soft and nontender. No organomegaly or masses felt. Normal bowel sounds heard. Central nervous system: Alert and oriented. No focal neurological deficits. Musculoskeletal: No edema. No calf tenderness Skin: No cyanosis. No rashes Psychiatry: Judgement and insight appear normal. Mood & affect appropriate.     Data Reviewed: I have personally reviewed following labs and imaging studies  CBC Lab Results  Component Value Date   WBC 10.3 09/05/2020  RBC 2.79 (L) 09/05/2020   HGB 9.3 (L) 09/05/2020   HCT 28.3 (L) 09/05/2020   MCV 101.4 (H) 09/05/2020   MCH 33.3 09/05/2020   PLT 290 09/05/2020   MCHC 32.9 09/05/2020   RDW 14.4 09/05/2020   LYMPHSABS 1.0 09/05/2020   MONOABS 0.6 08/27/2020   EOSABS 0.0 08/11/2020   BASOSABS 0.0 40/98/1191     Last metabolic  panel Lab Results  Component Value Date   NA 138 09/05/2020   K 3.1 (L) 09/05/2020   CL 108 09/05/2020   CO2 19 (L) 09/05/2020   BUN 36 (H) 09/05/2020   CREATININE 2.57 (H) 09/05/2020   GLUCOSE 125 (H) 09/05/2020   GFRNONAA 18 (L) 09/05/2020   CALCIUM 7.6 (L) 09/05/2020   PROT 6.5 06/21/2020   ALBUMIN 3.8 06/21/2020   LABGLOB 2.7 06/21/2020   AGRATIO 1.4 06/21/2020   BILITOT 0.3 06/21/2020   ALKPHOS 86 06/21/2020   AST 22 06/21/2020   ALT 12 06/21/2020   ANIONGAP 11 09/05/2020    CBG (last 3)  No results for input(s): GLUCAP in the last 72 hours.   GFR: Estimated Creatinine Clearance: 12 mL/min (A) (by C-G formula based on SCr of 2.57 mg/dL (H)).  Coagulation Profile: Recent Labs  Lab 09/05/2020 2205  INR 0.9    Recent Results (from the past 240 hour(s))  Resp Panel by RT-PCR (Flu A&B, Covid) Nasopharyngeal Swab     Status: None   Collection Time: 08/30/2020 10:15 PM   Specimen: Nasopharyngeal Swab; Nasopharyngeal(NP) swabs in vial transport medium  Result Value Ref Range Status   SARS Coronavirus 2 by RT PCR NEGATIVE NEGATIVE Final    Comment: (NOTE) SARS-CoV-2 target nucleic acids are NOT DETECTED.  The SARS-CoV-2 RNA is generally detectable in upper respiratory specimens during the acute phase of infection. The lowest concentration of SARS-CoV-2 viral copies this assay can detect is 138 copies/mL. A negative result does not preclude SARS-Cov-2 infection and should not be used as the sole basis for treatment or other patient management decisions. A negative result may occur with  improper specimen collection/handling, submission of specimen other than nasopharyngeal swab, presence of viral mutation(s) within the areas targeted by this assay, and inadequate number of viral copies(<138 copies/mL). A negative result must be combined with clinical observations, patient history, and epidemiological information. The expected result is Negative.  Fact Sheet for  Patients:  EntrepreneurPulse.com.au  Fact Sheet for Healthcare Providers:  IncredibleEmployment.be  This test is no t yet approved or cleared by the [place] FDA and  has been authorized for detection and/or diagnosis of SARS-CoV-2 by FDA under an Emergency Use Authorization (EUA). This EUA will remain  in effect (meaning this test can be used) for the duration of the COVID-19 declaration under Section 564(b)(1) of the Act, 21 U.S.C.section 360bbb-3(b)(1), unless the authorization is terminated  or revoked sooner.       Influenza A by PCR NEGATIVE NEGATIVE Final   Influenza B by PCR NEGATIVE NEGATIVE Final    Comment: (NOTE) The Xpert Xpress SARS-CoV-2/FLU/RSV plus assay is intended as an aid in the diagnosis of influenza from Nasopharyngeal swab specimens and should not be used as a sole basis for treatment. Nasal washings and aspirates are unacceptable for Xpert Xpress SARS-CoV-2/FLU/RSV testing.  Fact Sheet for Patients: EntrepreneurPulse.com.au  Fact Sheet for Healthcare Providers: IncredibleEmployment.be  This test is not yet approved or cleared by the [place] FDA and has been authorized for detection and/or diagnosis of SARS-CoV-2 by FDA under  an Emergency Use Authorization (EUA). This EUA will remain in effect (meaning this test can be used) for the duration of the COVID-19 declaration under Section 564(b)(1) of the Act, 21 U.S.C. section 360bbb-3(b)(1), unless the authorization is terminated or revoked.  Performed at [facility], [place] [address], [place], [place] [address]         Radiology Studies: CT Head Wo Contrast  Result Date: 08/25/2020 CLINICAL DATA:  Fall, tripped on stool. Head trauma, minor (Age >= 65y) EXAM: CT HEAD WITHOUT CONTRAST TECHNIQUE: Contiguous axial images were obtained from the base of the skull through the vertex without intravenous contrast.  COMPARISON:  Head CT 01/21/2016 FINDINGS: Brain: No intracranial hemorrhage, mass effect, or midline shift. Normal brain volume for age. Moderate periventricular and deep chronic small vessel ischemia. No hydrocephalus. The basilar cisterns are patent. No evidence of territorial infarct or acute ischemia. No extra-axial or intracranial fluid collection. Vascular: Atherosclerosis of skullbase vasculature without hyperdense vessel or abnormal calcification. Skull: No fracture or focal lesion. Sinuses/Orbits: Resolved right mastoid effusion. Paranasal sinuses and mastoid air cells are clear. Small mucous retention cyst left maxillary sinus. Bilateral lens extraction. Other: None. IMPRESSION: 1. No acute intracranial abnormality. No skull fracture. 2. Moderate chronic small vessel ischemia. Electronically Signed   By: Keith Rake M.D.   On: 08/17/2020 23:07   CT CERVICAL SPINE WO CONTRAST  Result Date: 09/02/2020 CLINICAL DATA:  Neck pain, chronic, no prior imaging Fall, trip on stool. EXAM: CT CERVICAL SPINE WITHOUT CONTRAST TECHNIQUE: Multidetector CT imaging of the cervical spine was performed without intravenous contrast. Multiplanar CT image reconstructions were also generated. COMPARISON:  None. FINDINGS: Alignment: No traumatic subluxation. Trace retrolisthesis of C3 on C4. Skull base and vertebrae: No acute fracture. Vertebral body heights are maintained. The dens and skull base are intact. Bone island within C7 vertebral body. Soft tissues and spinal canal: No prevertebral fluid or swelling. No visible canal hematoma. Disc levels: Diffuse degenerative disc disease, disc space narrowing is most prominent at C3-C4, C5-C6 and C6-C7. posterior disc osteophyte complex at C5-C6 and C4-C5 on the right causes narrowing of the vertebral foramen and mild mass effect on the canal. Upper chest: Layering pleural effusions, at least moderate in size. No apical pneumothorax. Other: Carotid calcifications. IMPRESSION:  1. Multilevel degenerative disc disease throughout the cervical spine without acute fracture or subluxation. 2. Posterior disc osteophyte complex at C5-C6 and C4-C5 on the right causes narrowing of the vertebral foramen and mild mass effect on the canal. 3. Layering pleural effusions at least moderate in size. Electronically Signed   By: Keith Rake M.D.   On: 08/16/2020 23:11   DG Chest Port 1 View  Result Date: 08/14/2020 CLINICAL DATA:  Fall, hip fracture, hand fracture, chest pain EXAM: PORTABLE CHEST 1 VIEW COMPARISON:  06/21/2020 FINDINGS: The lungs are mildly hyperinflated. Small bilateral pleural effusions are present resulting in gradual basilar opacification on the right. No pneumothorax. Mild cardiomegaly is stable. Pulmonary vascularity is normal. Surgical clips are seen within the right axilla. No acute bone abnormality. IMPRESSION: Small bilateral pleural effusions. Stable cardiomegaly. COPD. Electronically Signed   By: Fidela Salisbury MD   On: 08/22/2020 22:57   DG Hand Complete Left  Result Date: 08/14/2020 CLINICAL DATA:  Fall, left hand pain EXAM: LEFT HAND - COMPLETE 3+ VIEW COMPARISON:  None. FINDINGS: Three view radiograph left hand demonstrates an acute, transverse, extra-articular fracture of the mid to distal diaphysis of the left fifth metacarpal with moderate volar  angulation of the distal fracture fragment. No other fracture or dislocation identified. Joint spaces are preserved. Vascular calcifications are seen within the soft tissues. Soft tissue swelling is seen lateral to the fracture plane. IMPRESSION: Acute transverse angulated extra-articular fracture of the distal left fifth metacarpal diaphysis. Electronically Signed   By: Fidela Salisbury MD   On: 08/26/2020 22:55   DG Hip Unilat W or Wo Pelvis 2-3 Views Left  Result Date: 09/02/2020 CLINICAL DATA:  Fall, left hip deformity EXAM: DG HIP (WITH OR WITHOUT PELVIS) 2-3V LEFT COMPARISON:  06/05/2017 FINDINGS: There is an  acute, intratrochanteric fracture of the left hip with marked medial and posterior angulation, avulsion of the lesser trochanter, and mild impaction. The femoral head is still seated within the left acetabulum. Mild superimposed left hip degenerative arthritis. The pelvis and right hip are intact. Moderate right hip degenerative arthritis. Advanced vascular calcifications are seen within the pelvis and medial thighs bilaterally. Bilateral lower extremity arterial inflow stenting has been performed. Involuted fibroid noted within the pelvis. IMPRESSION: Acute, impacted, markedly angulated intratrochanteric fracture of the left hip with avulsion of the lesser trochanter. Femoral head is still seated within the left acetabulum. Mild left hip degenerative arthritis. Electronically Signed   By: Fidela Salisbury MD   On: 09/03/2020 22:54        Scheduled Meds: . diltiazem  120 mg Oral BID  . simvastatin  20 mg Oral q1800   Continuous Infusions:   LOS: 0 days     Cordelia Poche, MD [facility] 09/05/2020, 9:27 AM  If 7PM-7AM, please contact night-coverage [email or web address]

## 2020-09-05 NOTE — Consult Note (Signed)
Reason for Consult:Left hip fx Referring Physician: Georgena Shepherd Time called: 0730 Time at bedside: [name] is an 86 y.o. female.  HPI: Andrea Shepherd was at home and was getting up from the kitchen table to take her pills when she caught her toe on the table leg and fell. She had immediate left hand and hip pain and could not get up. She was brought to the ED where x-rays showed a left hip fx and left 5th MC fx and orthopedic surgery was consulted. She lives at home with her husband and does not use any assistive devices to ambulate.  Past Medical History:  Diagnosis Date  . Age-related osteoporosis without current pathological fracture   . Essential (primary) hypertension   . History of malignant neoplasm of breast   . Impaired fasting glucose   . Mixed hyperlipidemia   . Occlusion and stenosis of bilateral carotid arteries     Past Surgical History:  Procedure Laterality Date  . BREAST LUMPECTOMY  2010   right lower  . CAROTID ENDARTERECTOMY     Left  . CATARACT EXTRACTION Right 2016  . FEMORAL ARTERY STENT Right    04/2015  . FEMORAL BYPASS Bilateral    Left done 06/25/2015 and right done 07/2015  . left tumor removed from neck      Family History  Problem Relation Age of Onset  . Cancer Father   . Cancer Sister     Social History:  reports that she has been smoking cigarettes. She has never used smokeless tobacco. She reports previous alcohol use. She reports that she does not use drugs.  Allergies:  Allergies  Allergen Reactions  . Sulfamethoxazole-Trimethoprim Diarrhea and Other (See Comments)    UNKNOWN REACTION   . Nitrofurantoin Diarrhea and Other (See Comments)    UNKNOWN REACTION     Medications: I have reviewed the patient's current medications.  Results for orders placed or performed during the hospital encounter of 08/19/2020 (from the past 48 hour(s))  Basic metabolic panel     Status: Abnormal   Collection Time: 08/18/2020 10:05 PM  Result Value Ref  Range   Sodium 136 135 - 145 mmol/L   Potassium 3.3 (L) 3.5 - 5.1 mmol/L   Chloride 103 98 - 111 mmol/L   CO2 19 (L) 22 - 32 mmol/L   Glucose, Bld 113 (H) 70 - 99 mg/dL    Comment: Glucose reference range applies only to samples taken after fasting for at least 8 hours.   BUN 38 (H) 8 - 23 mg/dL   Creatinine, Ser 2.63 (H) 0.44 - 1.00 mg/dL   Calcium 7.4 (L) 8.9 - 10.3 mg/dL   GFR, Estimated 18 (L) >60 mL/min    Comment: (NOTE) Calculated using the CKD-EPI Creatinine Equation (2021)    Anion gap 14 5 - 15    Comment: Performed at [facility] [address], [place], [place] [address]  CBC WITH DIFFERENTIAL     Status: Abnormal   Collection Time: 08/26/2020 10:05 PM  Result Value Ref Range   WBC 7.7 4.0 - 10.5 K/uL   RBC 2.95 (L) 3.87 - 5.11 MIL/uL   Hemoglobin 9.8 (L) 12.0 - 15.0 g/dL   HCT 29.2 (L) 36.0 - 46.0 %   MCV 99.0 80.0 - 100.0 fL   MCH 33.2 26.0 - 34.0 pg   MCHC 33.6 30.0 - 36.0 g/dL   RDW 14.4 11.5 - 15.5 %   Platelets 310 150 - 400  K/uL   nRBC 0.0 0.0 - 0.2 %   Neutrophils Relative % 79 %   Neutro Abs 6.0 1.7 - 7.7 K/uL   Lymphocytes Relative 13 %   Lymphs Abs 1.0 0.7 - 4.0 K/uL   Monocytes Relative 7 %   Monocytes Absolute 0.6 0.1 - 1.0 K/uL   Eosinophils Relative 0 %   Eosinophils Absolute 0.0 0.0 - 0.5 K/uL   Basophils Relative 0 %   Basophils Absolute 0.0 0.0 - 0.1 K/uL   Immature Granulocytes 1 %   Abs Immature Granulocytes 0.04 0.00 - 0.07 K/uL    Comment: Performed at [facility] [address], [place], [place] [address]  Protime-INR     Status: None   Collection Time: 08/30/2020 10:05 PM  Result Value Ref Range   Prothrombin Time 11.8 11.4 - 15.2 seconds   INR 0.9 0.8 - 1.2    Comment: (NOTE) INR goal varies based on device and disease states. Performed at [facility], [place] [address], [place], [place] [address]   ABO/Rh     Status: None   Collection Time: 08/31/2020 10:05 PM  Result Value Ref Range   ABO/RH(D)      A  POS Performed at [facility] [address] [place], [place] [address]   Resp Panel by RT-PCR (Flu A&B, Covid) Nasopharyngeal Swab     Status: None   Collection Time: 08/27/2020 10:15 PM   Specimen: Nasopharyngeal Swab; Nasopharyngeal(NP) swabs in vial transport medium  Result Value Ref Range   SARS Coronavirus 2 by RT PCR NEGATIVE NEGATIVE    Comment: (NOTE) SARS-CoV-2 target nucleic acids are NOT DETECTED.  The SARS-CoV-2 RNA is generally detectable in upper respiratory specimens during the acute phase of infection. The lowest concentration of SARS-CoV-2 viral copies this assay can detect is 138 copies/mL. A negative result does not preclude SARS-Cov-2 infection and should not be used as the sole basis for treatment or other patient management decisions. A negative result may occur with  improper specimen collection/handling, submission of specimen other than nasopharyngeal swab, presence of viral mutation(s) within the areas targeted by this assay, and inadequate number of viral copies(<138 copies/mL). A negative result must be combined with clinical observations, patient history, and epidemiological information. The expected result is Negative.  Fact Sheet for Patients:  EntrepreneurPulse.com.au  Fact Sheet for Healthcare Providers:  IncredibleEmployment.be  This test is no t yet approved or cleared by the [place] FDA and  has been authorized for detection and/or diagnosis of SARS-CoV-2 by FDA under an Emergency Use Authorization (EUA). This EUA will remain  in effect (meaning this test can be used) for the duration of the COVID-19 declaration under Section 564(b)(1) of the Act, 21 U.S.C.section 360bbb-3(b)(1), unless the authorization is terminated  or revoked sooner.       Influenza A by PCR NEGATIVE NEGATIVE   Influenza B by PCR NEGATIVE NEGATIVE    Comment: (NOTE) The Xpert Xpress SARS-CoV-2/FLU/RSV plus assay is  intended as an aid in the diagnosis of influenza from Nasopharyngeal swab specimens and should not be used as a sole basis for treatment. Nasal washings and aspirates are unacceptable for Xpert Xpress SARS-CoV-2/FLU/RSV testing.  Fact Sheet for Patients: EntrepreneurPulse.com.au  Fact Sheet for Healthcare Providers: IncredibleEmployment.be  This test is not yet approved or cleared by the [place] FDA and has been authorized for detection and/or diagnosis of SARS-CoV-2 by FDA under an Emergency Use Authorization (EUA). This EUA will  remain in effect (meaning this test can be used) for the duration of the COVID-19 declaration under Section 564(b)(1) of the Act, 21 U.S.C. section 360bbb-3(b)(1), unless the authorization is terminated or revoked.  Performed at [facility], [place] [address], [place], [place] [address]   Type and screen [place]     Status: None   Collection Time: 08/21/2020 11:20 PM  Result Value Ref Range   ABO/RH(D) A POS    Antibody Screen NEG    Sample Expiration      09/09/2020,2359 Performed at [facility], [place] [address], [place], [place] [address]   CBC     Status: Abnormal   Collection Time: 09/05/20  2:17 AM  Result Value Ref Range   WBC 10.3 4.0 - 10.5 K/uL   RBC 2.79 (L) 3.87 - 5.11 MIL/uL   Hemoglobin 9.3 (L) 12.0 - 15.0 g/dL   HCT 28.3 (L) 36.0 - 46.0 %   MCV 101.4 (H) 80.0 - 100.0 fL   MCH 33.3 26.0 - 34.0 pg   MCHC 32.9 30.0 - 36.0 g/dL   RDW 14.4 11.5 - 15.5 %   Platelets 290 150 - 400 K/uL   nRBC 0.0 0.0 - 0.2 %    Comment: Performed at [facility], [place] [address] [place], [place] [address]  Basic metabolic panel     Status: Abnormal   Collection Time: 09/05/20  2:17 AM  Result Value Ref Range   Sodium 138 135 - 145 mmol/L   Potassium 3.1 (L) 3.5 - 5.1 mmol/L   Chloride 108 98 - 111 mmol/L   CO2 19 (L) 22 - 32 mmol/L   Glucose, Bld 125 (H) 70 - 99 mg/dL     Comment: Glucose reference range applies only to samples taken after fasting for at least 8 hours.   BUN 36 (H) 8 - 23 mg/dL   Creatinine, Ser 2.57 (H) 0.44 - 1.00 mg/dL   Calcium 7.6 (L) 8.9 - 10.3 mg/dL   GFR, Estimated 18 (L) >60 mL/min    Comment: (NOTE) Calculated using the CKD-EPI Creatinine Equation (2021)    Anion gap 11 5 - 15    Comment: Performed at [facility] [address], [place], [place] [address]  Magnesium     Status: Abnormal   Collection Time: 09/05/20  2:17 AM  Result Value Ref Range   Magnesium 1.3 (L) 1.7 - 2.4 mg/dL    Comment: Performed at [facility] [address], [place],  [address]    CT Head Wo Contrast  Result Date: 08/24/2020 CLINICAL DATA:  Fall, tripped on stool. Head trauma, minor (Age >= 65y) EXAM: CT HEAD WITHOUT CONTRAST TECHNIQUE: Contiguous axial images were obtained from the base of the skull through the vertex without intravenous contrast. COMPARISON:  Head CT 01/21/2016 FINDINGS: Brain: No intracranial hemorrhage, mass effect, or midline shift. Normal brain volume for age. Moderate periventricular and deep chronic small vessel ischemia. No hydrocephalus. The basilar cisterns are patent. No evidence of territorial infarct or acute ischemia. No extra-axial or intracranial fluid collection. Vascular: Atherosclerosis of skullbase vasculature without hyperdense vessel or abnormal calcification. Skull: No fracture or focal lesion. Sinuses/Orbits: Resolved right mastoid effusion. Paranasal sinuses and mastoid air cells are clear. Small mucous retention cyst left maxillary sinus. Bilateral lens extraction. Other: None. IMPRESSION: 1. No acute intracranial abnormality. No skull fracture. 2. Moderate chronic small vessel ischemia. Electronically Signed   By: Threasa Beards  Sanford M.D.   On: 09/02/2020 23:07   CT CERVICAL SPINE WO CONTRAST  Result Date: 09/05/2020 CLINICAL DATA:  Neck pain, chronic, no prior imaging Fall, trip on stool. EXAM:  CT CERVICAL SPINE WITHOUT CONTRAST TECHNIQUE: Multidetector CT imaging of the cervical spine was performed without intravenous contrast. Multiplanar CT image reconstructions were also generated. COMPARISON:  None. FINDINGS: Alignment: No traumatic subluxation. Trace retrolisthesis of C3 on C4. Skull base and vertebrae: No acute fracture. Vertebral body heights are maintained. The dens and skull base are intact. Bone island within C7 vertebral body. Soft tissues and spinal canal: No prevertebral fluid or swelling. No visible canal hematoma. Disc levels: Diffuse degenerative disc disease, disc space narrowing is most prominent at C3-C4, C5-C6 and C6-C7. posterior disc osteophyte complex at C5-C6 and C4-C5 on the right causes narrowing of the vertebral foramen and mild mass effect on the canal. Upper chest: Layering pleural effusions, at least moderate in size. No apical pneumothorax. Other: Carotid calcifications. IMPRESSION: 1. Multilevel degenerative disc disease throughout the cervical spine without acute fracture or subluxation. 2. Posterior disc osteophyte complex at C5-C6 and C4-C5 on the right causes narrowing of the vertebral foramen and mild mass effect on the canal. 3. Layering pleural effusions at least moderate in size. Electronically Signed   By: Keith Rake M.D.   On: 08/25/2020 23:11   DG Chest Port 1 View  Result Date: 08/14/2020 CLINICAL DATA:  Fall, hip fracture, hand fracture, chest pain EXAM: PORTABLE CHEST 1 VIEW COMPARISON:  06/21/2020 FINDINGS: The lungs are mildly hyperinflated. Small bilateral pleural effusions are present resulting in gradual basilar opacification on the right. No pneumothorax. Mild cardiomegaly is stable. Pulmonary vascularity is normal. Surgical clips are seen within the right axilla. No acute bone abnormality. IMPRESSION: Small bilateral pleural effusions. Stable cardiomegaly. COPD. Electronically Signed   By: Fidela Salisbury MD   On: 09/05/2020 22:57   DG Hand  Complete Left  Result Date: 08/24/2020 CLINICAL DATA:  Fall, left hand pain EXAM: LEFT HAND - COMPLETE 3+ VIEW COMPARISON:  None. FINDINGS: Three view radiograph left hand demonstrates an acute, transverse, extra-articular fracture of the mid to distal diaphysis of the left fifth metacarpal with moderate volar angulation of the distal fracture fragment. No other fracture or dislocation identified. Joint spaces are preserved. Vascular calcifications are seen within the soft tissues. Soft tissue swelling is seen lateral to the fracture plane. IMPRESSION: Acute transverse angulated extra-articular fracture of the distal left fifth metacarpal diaphysis. Electronically Signed   By: Fidela Salisbury MD   On: 08/25/2020 22:55   DG Hip Unilat W or Wo Pelvis 2-3 Views Left  Result Date: 08/18/2020 CLINICAL DATA:  Fall, left hip deformity EXAM: DG HIP (WITH OR WITHOUT PELVIS) 2-3V LEFT COMPARISON:  06/05/2017 FINDINGS: There is an acute, intratrochanteric fracture of the left hip with marked medial and posterior angulation, avulsion of the lesser trochanter, and mild impaction. The femoral head is still seated within the left acetabulum. Mild superimposed left hip degenerative arthritis. The pelvis and right hip are intact. Moderate right hip degenerative arthritis. Advanced vascular calcifications are seen within the pelvis and medial thighs bilaterally. Bilateral lower extremity arterial inflow stenting has been performed. Involuted fibroid noted within the pelvis. IMPRESSION: Acute, impacted, markedly angulated intratrochanteric fracture of the left hip with avulsion of the lesser trochanter. Femoral head is still seated within the left acetabulum. Mild left hip degenerative arthritis. Electronically Signed   By: Fidela Salisbury MD   On: 08/22/2020 22:54  Review of Systems  HENT: Negative for ear discharge, ear pain, hearing loss and tinnitus.   Eyes: Negative for photophobia and pain.  Respiratory: Negative for  cough and shortness of breath.   Cardiovascular: Negative for chest pain.  Gastrointestinal: Negative for abdominal pain, nausea and vomiting.  Genitourinary: Negative for dysuria, flank pain, frequency and urgency.  Musculoskeletal: Positive for arthralgias (Left hip, left hand). Negative for back pain, myalgias and neck pain.  Neurological: Negative for dizziness and headaches.  Hematological: Does not bruise/bleed easily.  Psychiatric/Behavioral: The patient is not nervous/anxious.    Blood pressure (!) 192/78, pulse 83, temperature 98.7 F (37.1 C), temperature source Oral, resp. rate 14, height 5\' 8"  (1.727 m), weight 45.8 kg, SpO2 93 %. Physical Exam Constitutional:      General: She is not in acute distress.    Appearance: She is well-developed. She is not diaphoretic.  HENT:     Head: Normocephalic and atraumatic.  Eyes:     General: No scleral icterus.       Right eye: No discharge.        Left eye: No discharge.     Conjunctiva/sclera: Conjunctivae normal.  Cardiovascular:     Rate and Rhythm: Normal rate and regular rhythm.  Pulmonary:     Effort: Pulmonary effort is normal. No respiratory distress.  Musculoskeletal:     Cervical back: Normal range of motion.     Comments: Left shoulder, elbow, wrist, digits- no skin wounds, nontender, ulnar gutter splint in place, no instability, no blocks to motion  Sens  Ax/R/M/U grossly intact  Mot   Ax/ R/ PIN/ M/ AIN/ U grossly intact  LLE No traumatic wounds, ecchymosis, or rash  Mod TTP hip, mild TTP superolateral knee  No knee or ankle effusion  Knee stable to varus/ valgus and anterior/posterior stress  Sens DPN, SPN, TN intact  Motor EHL, ext, flex, evers 5/5  DP 2+, PT 1+, No significant edema  Skin:    General: Skin is warm and dry.  Neurological:     Mental Status: She is alert.  Psychiatric:        Behavior: Behavior normal.     Assessment/Plan: Left hip fx -- Plan IMN tomorrow with Dr. Erlinda Hong. Please keep NPO  after MN. Left 5th MC fx -- Continue ulnar gutter splint. Multiple medical problems including peripheral arterial disease, hypertension, chronic kidney disease stage IV, and current smoker -- per primary service    Lisette Abu, PA-C Orthopedic Surgery [phone] 09/05/2020, 9:31 AM

## 2020-09-06 ENCOUNTER — Inpatient Hospital Stay (HOSPITAL_COMMUNITY): Payer: Medicare Other

## 2020-09-06 ENCOUNTER — Inpatient Hospital Stay (HOSPITAL_COMMUNITY): Payer: Medicare Other | Admitting: Certified Registered Nurse Anesthetist

## 2020-09-06 ENCOUNTER — Encounter (HOSPITAL_COMMUNITY): Payer: Self-pay | Admitting: Family Medicine

## 2020-09-06 ENCOUNTER — Encounter (HOSPITAL_COMMUNITY): Admission: EM | Disposition: E | Payer: Self-pay | Source: Home / Self Care | Attending: Family Medicine

## 2020-09-06 DIAGNOSIS — E876 Hypokalemia: Secondary | ICD-10-CM | POA: Diagnosis not present

## 2020-09-06 DIAGNOSIS — S72002A Fracture of unspecified part of neck of left femur, initial encounter for closed fracture: Secondary | ICD-10-CM

## 2020-09-06 DIAGNOSIS — N184 Chronic kidney disease, stage 4 (severe): Secondary | ICD-10-CM | POA: Diagnosis not present

## 2020-09-06 DIAGNOSIS — I160 Hypertensive urgency: Secondary | ICD-10-CM | POA: Diagnosis not present

## 2020-09-06 HISTORY — PX: INTRAMEDULLARY (IM) NAIL INTERTROCHANTERIC: SHX5875

## 2020-09-06 LAB — CBC
HCT: 24.8 % — ABNORMAL LOW (ref 36.0–46.0)
Hemoglobin: 8.3 g/dL — ABNORMAL LOW (ref 12.0–15.0)
MCH: 33.2 pg (ref 26.0–34.0)
MCHC: 33.5 g/dL (ref 30.0–36.0)
MCV: 99.2 fL (ref 80.0–100.0)
Platelets: 264 10*3/uL (ref 150–400)
RBC: 2.5 MIL/uL — ABNORMAL LOW (ref 3.87–5.11)
RDW: 14.6 % (ref 11.5–15.5)
WBC: 8.6 10*3/uL (ref 4.0–10.5)
nRBC: 0 % (ref 0.0–0.2)

## 2020-09-06 LAB — BASIC METABOLIC PANEL
Anion gap: 10 (ref 5–15)
BUN: 38 mg/dL — ABNORMAL HIGH (ref 8–23)
CO2: 21 mmol/L — ABNORMAL LOW (ref 22–32)
Calcium: 8.2 mg/dL — ABNORMAL LOW (ref 8.9–10.3)
Chloride: 109 mmol/L (ref 98–111)
Creatinine, Ser: 2.72 mg/dL — ABNORMAL HIGH (ref 0.44–1.00)
GFR, Estimated: 17 mL/min — ABNORMAL LOW (ref >60)
Glucose, Bld: 100 mg/dL — ABNORMAL HIGH (ref 70–99)
Potassium: 4.2 mmol/L (ref 3.5–5.1)
Sodium: 140 mmol/L (ref 135–145)

## 2020-09-06 LAB — PREPARE RBC (CROSSMATCH)

## 2020-09-06 SURGERY — FIXATION, FRACTURE, INTERTROCHANTERIC, WITH INTRAMEDULLARY ROD
Anesthesia: General | Site: Hip | Laterality: Left

## 2020-09-06 MED ORDER — PHENYLEPHRINE HCL-NACL 10-0.9 MG/250ML-% IV SOLN
INTRAVENOUS | Status: DC | PRN
  Administered 2020-09-06: 50 ug/min via INTRAVENOUS

## 2020-09-06 MED ORDER — TRANEXAMIC ACID 1000 MG/10ML IV SOLN
INTRAVENOUS | Status: DC | PRN
  Administered 2020-09-06: 2000 mg via TOPICAL

## 2020-09-06 MED ORDER — TRANEXAMIC ACID 1000 MG/10ML IV SOLN
2000.0000 mg | Freq: Once | INTRAVENOUS | Status: DC
  Filled 2020-09-06: qty 20

## 2020-09-06 MED ORDER — TRANEXAMIC ACID-NACL 1000-0.7 MG/100ML-% IV SOLN
1000.0000 mg | Freq: Once | INTRAVENOUS | Status: AC
  Administered 2020-09-06: 1000 mg via INTRAVENOUS
  Filled 2020-09-06: qty 100

## 2020-09-06 MED ORDER — METHOCARBAMOL 500 MG PO TABS: 500.0000 mg | ORAL_TABLET | Freq: Four times a day (QID) | ORAL | Status: DC | PRN

## 2020-09-06 MED ORDER — HYDRALAZINE HCL 50 MG PO TABS
50.0000 mg | ORAL_TABLET | Freq: Three times a day (TID) | ORAL | Status: DC
  Administered 2020-09-06 – 2020-09-12 (×17): 50 mg via ORAL
  Filled 2020-09-06 (×18): qty 1

## 2020-09-06 MED ORDER — CLOPIDOGREL BISULFATE 75 MG PO TABS
75.0000 mg | ORAL_TABLET | Freq: Every day | ORAL | Status: DC
  Administered 2020-09-07 – 2020-09-11 (×5): 75 mg via ORAL
  Filled 2020-09-06 (×5): qty 1

## 2020-09-06 MED ORDER — ACETAMINOPHEN 325 MG PO TABS
325.0000 mg | ORAL_TABLET | Freq: Four times a day (QID) | ORAL | Status: DC | PRN
Start: 2020-09-07 — End: 2020-09-13

## 2020-09-06 MED ORDER — ROCURONIUM BROMIDE 100 MG/10ML IV SOLN
INTRAVENOUS | Status: DC | PRN
  Administered 2020-09-06: 40 mg via INTRAVENOUS

## 2020-09-06 MED ORDER — DOCUSATE SODIUM 100 MG PO CAPS
100.0000 mg | ORAL_CAPSULE | Freq: Two times a day (BID) | ORAL | Status: DC
  Administered 2020-09-06 – 2020-09-11 (×10): 100 mg via ORAL
  Filled 2020-09-06 (×11): qty 1

## 2020-09-06 MED ORDER — ACETAMINOPHEN 500 MG PO TABS
1000.0000 mg | ORAL_TABLET | Freq: Four times a day (QID) | ORAL | Status: AC
  Administered 2020-09-06 – 2020-09-07 (×4): 1000 mg via ORAL
  Filled 2020-09-06 (×4): qty 2

## 2020-09-06 MED ORDER — MENTHOL 3 MG MT LOZG
1.0000 | LOZENGE | OROMUCOSAL | Status: DC | PRN
  Filled 2020-09-06: qty 9

## 2020-09-06 MED ORDER — CHLORHEXIDINE GLUCONATE 0.12 % MT SOLN: 15.0000 mL | Freq: Once | OROMUCOSAL | Status: AC

## 2020-09-06 MED ORDER — OXYCODONE-ACETAMINOPHEN 5-325 MG PO TABS: 1.0000 | ORAL_TABLET | Freq: Three times a day (TID) | ORAL | 0 refills | Status: AC | PRN

## 2020-09-06 MED ORDER — FENTANYL CITRATE (PF) 100 MCG/2ML IJ SOLN: 25.0000 ug | INTRAMUSCULAR | Status: DC | PRN

## 2020-09-06 MED ORDER — ADULT MULTIVITAMIN W/MINERALS CH
1.0000 | ORAL_TABLET | Freq: Every day | ORAL | Status: DC
  Administered 2020-09-07 – 2020-09-11 (×5): 1 via ORAL
  Filled 2020-09-06 (×5): qty 1

## 2020-09-06 MED ORDER — 0.9 % SODIUM CHLORIDE (POUR BTL) OPTIME
TOPICAL | Status: DC | PRN
  Administered 2020-09-06: 1000 mL

## 2020-09-06 MED ORDER — DEXAMETHASONE SODIUM PHOSPHATE 10 MG/ML IJ SOLN
INTRAMUSCULAR | Status: DC | PRN
  Administered 2020-09-06: 5 mg via INTRAVENOUS

## 2020-09-06 MED ORDER — DEXAMETHASONE SODIUM PHOSPHATE 10 MG/ML IJ SOLN
INTRAMUSCULAR | Status: AC
  Filled 2020-09-06: qty 1

## 2020-09-06 MED ORDER — ROCURONIUM BROMIDE 10 MG/ML (PF) SYRINGE
PREFILLED_SYRINGE | INTRAVENOUS | Status: AC
  Filled 2020-09-06: qty 10

## 2020-09-06 MED ORDER — LABETALOL HCL 5 MG/ML IV SOLN
INTRAVENOUS | Status: AC
  Filled 2020-09-06: qty 4

## 2020-09-06 MED ORDER — LIDOCAINE 2% (20 MG/ML) 5 ML SYRINGE
INTRAMUSCULAR | Status: AC
  Filled 2020-09-06: qty 5

## 2020-09-06 MED ORDER — PHENOL 1.4 % MT LIQD: 1.0000 | OROMUCOSAL | Status: DC | PRN

## 2020-09-06 MED ORDER — LIDOCAINE 2% (20 MG/ML) 5 ML SYRINGE
INTRAMUSCULAR | Status: DC | PRN
  Administered 2020-09-06: 40 mg via INTRAVENOUS

## 2020-09-06 MED ORDER — SODIUM CHLORIDE 0.9 % IV SOLN: INTRAVENOUS | Status: DC

## 2020-09-06 MED ORDER — ONDANSETRON HCL 4 MG PO TABS: 4.0000 mg | ORAL_TABLET | Freq: Four times a day (QID) | ORAL | Status: DC | PRN

## 2020-09-06 MED ORDER — ORAL CARE MOUTH RINSE: 15.0000 mL | Freq: Once | OROMUCOSAL | Status: AC

## 2020-09-06 MED ORDER — CEFAZOLIN SODIUM-DEXTROSE 2-4 GM/100ML-% IV SOLN
2.0000 g | Freq: Four times a day (QID) | INTRAVENOUS | Status: DC
  Filled 2020-09-06 (×2): qty 100

## 2020-09-06 MED ORDER — TRANEXAMIC ACID-NACL 1000-0.7 MG/100ML-% IV SOLN
INTRAVENOUS | Status: AC
  Filled 2020-09-06: qty 100

## 2020-09-06 MED ORDER — ACETAMINOPHEN 10 MG/ML IV SOLN: 1000.0000 mg | Freq: Once | INTRAVENOUS | Status: DC | PRN

## 2020-09-06 MED ORDER — HYDROMORPHONE HCL 1 MG/ML IJ SOLN
0.5000 mg | INTRAMUSCULAR | Status: DC | PRN
  Administered 2020-09-08: 0.5 mg via INTRAVENOUS
  Administered 2020-09-08 – 2020-09-10 (×2): 1 mg via INTRAVENOUS
  Administered 2020-09-11: 0.5 mg via INTRAVENOUS
  Filled 2020-09-06 (×5): qty 1

## 2020-09-06 MED ORDER — PROPOFOL 10 MG/ML IV BOLUS
INTRAVENOUS | Status: DC | PRN
  Administered 2020-09-06: 70 mg via INTRAVENOUS

## 2020-09-06 MED ORDER — ONDANSETRON HCL 4 MG/2ML IJ SOLN
INTRAMUSCULAR | Status: AC
  Filled 2020-09-06: qty 2

## 2020-09-06 MED ORDER — PHENYLEPHRINE 40 MCG/ML (10ML) SYRINGE FOR IV PUSH (FOR BLOOD PRESSURE SUPPORT)
PREFILLED_SYRINGE | INTRAVENOUS | Status: DC | PRN
  Administered 2020-09-06: 40 ug via INTRAVENOUS
  Administered 2020-09-06: 80 ug via INTRAVENOUS
  Administered 2020-09-06: 120 ug via INTRAVENOUS
  Administered 2020-09-06: 40 ug via INTRAVENOUS

## 2020-09-06 MED ORDER — CHLORHEXIDINE GLUCONATE 0.12 % MT SOLN
15.0000 mL | OROMUCOSAL | Status: AC
  Administered 2020-09-07: 15 mL via OROMUCOSAL
  Filled 2020-09-06 (×2): qty 15

## 2020-09-06 MED ORDER — FENTANYL CITRATE (PF) 250 MCG/5ML IJ SOLN
INTRAMUSCULAR | Status: DC | PRN
  Administered 2020-09-06 (×2): 50 ug via INTRAVENOUS

## 2020-09-06 MED ORDER — OXYCODONE HCL 5 MG PO TABS
10.0000 mg | ORAL_TABLET | ORAL | Status: DC | PRN
  Administered 2020-09-08: 10 mg via ORAL

## 2020-09-06 MED ORDER — POLYETHYLENE GLYCOL 3350 17 G PO PACK: 17.0000 g | PACK | Freq: Every day | ORAL | Status: DC | PRN

## 2020-09-06 MED ORDER — ENSURE ENLIVE PO LIQD
237.0000 mL | Freq: Three times a day (TID) | ORAL | Status: DC
  Administered 2020-09-07 – 2020-09-11 (×11): 237 mL via ORAL

## 2020-09-06 MED ORDER — OXYCODONE HCL 5 MG PO TABS
5.0000 mg | ORAL_TABLET | ORAL | Status: DC | PRN
  Administered 2020-09-07: 5 mg via ORAL
  Administered 2020-09-07: 10 mg via ORAL
  Administered 2020-09-09 – 2020-09-12 (×2): 5 mg via ORAL
  Filled 2020-09-06: qty 1
  Filled 2020-09-06: qty 2
  Filled 2020-09-06: qty 1
  Filled 2020-09-06: qty 2
  Filled 2020-09-06: qty 1
  Filled 2020-09-06: qty 2

## 2020-09-06 MED ORDER — LABETALOL HCL 5 MG/ML IV SOLN
INTRAVENOUS | Status: DC | PRN
  Administered 2020-09-06: 5 mg via INTRAVENOUS

## 2020-09-06 MED ORDER — ONDANSETRON HCL 4 MG/2ML IJ SOLN
INTRAMUSCULAR | Status: DC | PRN
  Administered 2020-09-06: 4 mg via INTRAVENOUS

## 2020-09-06 MED ORDER — SORBITOL 70 % SOLN
30.0000 mL | Freq: Every day | Status: DC | PRN
  Filled 2020-09-06: qty 30

## 2020-09-06 MED ORDER — SUGAMMADEX SODIUM 200 MG/2ML IV SOLN
INTRAVENOUS | Status: DC | PRN
  Administered 2020-09-06: 50 mg via INTRAVENOUS
  Administered 2020-09-06: 100 mg via INTRAVENOUS
  Administered 2020-09-06: 50 mg via INTRAVENOUS

## 2020-09-06 MED ORDER — METHOCARBAMOL 1000 MG/10ML IJ SOLN
500.0000 mg | Freq: Four times a day (QID) | INTRAVENOUS | Status: DC | PRN
  Filled 2020-09-06: qty 5

## 2020-09-06 MED ORDER — CEFAZOLIN SODIUM-DEXTROSE 2-3 GM-%(50ML) IV SOLR
INTRAVENOUS | Status: DC | PRN
  Administered 2020-09-06: 2 g via INTRAVENOUS

## 2020-09-06 MED ORDER — CEFAZOLIN SODIUM-DEXTROSE 1-4 GM/50ML-% IV SOLN
1.0000 g | Freq: Once | INTRAVENOUS | Status: AC
  Administered 2020-09-06: 1 g via INTRAVENOUS
  Filled 2020-09-06 (×3): qty 50

## 2020-09-06 MED ORDER — CHLORHEXIDINE GLUCONATE 0.12 % MT SOLN
OROMUCOSAL | Status: AC
  Administered 2020-09-06: 15 mL via OROMUCOSAL
  Filled 2020-09-06: qty 15

## 2020-09-06 MED ORDER — FENTANYL CITRATE (PF) 250 MCG/5ML IJ SOLN
INTRAMUSCULAR | Status: AC
  Filled 2020-09-06: qty 5

## 2020-09-06 MED ORDER — PROPOFOL 10 MG/ML IV BOLUS
INTRAVENOUS | Status: AC
  Filled 2020-09-06: qty 20

## 2020-09-06 MED ORDER — ONDANSETRON HCL 4 MG/2ML IJ SOLN: 4.0000 mg | Freq: Once | INTRAMUSCULAR | Status: DC | PRN

## 2020-09-06 MED ORDER — PHENYLEPHRINE 40 MCG/ML (10ML) SYRINGE FOR IV PUSH (FOR BLOOD PRESSURE SUPPORT)
PREFILLED_SYRINGE | INTRAVENOUS | Status: AC
  Filled 2020-09-06: qty 10

## 2020-09-06 MED ORDER — MAGNESIUM CITRATE PO SOLN
1.0000 | Freq: Once | ORAL | Status: DC | PRN
  Filled 2020-09-06: qty 296

## 2020-09-06 MED ORDER — ONDANSETRON HCL 4 MG/2ML IJ SOLN: 4.0000 mg | Freq: Four times a day (QID) | INTRAMUSCULAR | Status: DC | PRN

## 2020-09-06 MED ORDER — ALUM & MAG HYDROXIDE-SIMETH 200-200-20 MG/5ML PO SUSP: 30.0000 mL | ORAL | Status: DC | PRN

## 2020-09-06 MED ORDER — SODIUM CHLORIDE 0.9% IV SOLUTION: Freq: Once | INTRAVENOUS | Status: DC

## 2020-09-06 MED ORDER — CEFAZOLIN SODIUM-DEXTROSE 2-4 GM/100ML-% IV SOLN
INTRAVENOUS | Status: AC
  Filled 2020-09-06: qty 100

## 2020-09-06 SURGICAL SUPPLY — 47 items
BIT DRILL INTERTAN LAG SCREW (BIT) ×1 IMPLANT
BIT DRILL SHORT 4.0 (BIT) IMPLANT
BNDG COHESIVE 4X5 TAN STRL (GAUZE/BANDAGES/DRESSINGS) ×2 IMPLANT
BNDG COHESIVE 6X5 TAN STRL LF (GAUZE/BANDAGES/DRESSINGS) ×1 IMPLANT
BNDG GAUZE ELAST 4 BULKY (GAUZE/BANDAGES/DRESSINGS) ×2 IMPLANT
CATH FOLEY 2WAY 5CC 16FR (CATHETERS) ×1
CATH URTH STD 16FR FL 2W DRN (CATHETERS) IMPLANT
COVER PERINEAL POST (MISCELLANEOUS) ×2 IMPLANT
COVER SURGICAL LIGHT HANDLE (MISCELLANEOUS) ×2 IMPLANT
COVER WAND RF STERILE (DRAPES) ×2 IMPLANT
DRAPE C-ARMOR (DRAPES) ×2 IMPLANT
DRAPE STERI IOBAN 125X83 (DRAPES) ×2 IMPLANT
DRESSING MEPILEX FLEX 4X4 (GAUZE/BANDAGES/DRESSINGS) IMPLANT
DRILL BIT SHORT 4.0 (BIT) ×1
DRSG MEPILEX BORDER 4X4 (GAUZE/BANDAGES/DRESSINGS) ×4 IMPLANT
DRSG MEPILEX FLEX 4X4 (GAUZE/BANDAGES/DRESSINGS)
DRSG PAD ABDOMINAL 8X10 ST (GAUZE/BANDAGES/DRESSINGS) ×4 IMPLANT
DURAPREP 26ML APPLICATOR (WOUND CARE) ×2 IMPLANT
ELECT REM PT RETURN 9FT ADLT (ELECTROSURGICAL) ×2
ELECTRODE REM PT RTRN 9FT ADLT (ELECTROSURGICAL) ×1 IMPLANT
GLOVE ECLIPSE 7.0 STRL STRAW (GLOVE) ×2 IMPLANT
GLOVE SKINSENSE NS SZ7.5 (GLOVE) ×2
GLOVE SKINSENSE STRL SZ7.5 (GLOVE) ×2 IMPLANT
GLOVE SURG UNDER POLY LF SZ7 (GLOVE) ×2 IMPLANT
GOWN STRL REIN XL XLG (GOWN DISPOSABLE) ×2 IMPLANT
GUIDE PIN 3.2X343 (PIN) ×2
GUIDE PIN 3.2X343MM (PIN) ×2
KIT BASIN OR (CUSTOM PROCEDURE TRAY) ×2 IMPLANT
KIT TURNOVER KIT B (KITS) ×2 IMPLANT
MANIFOLD NEPTUNE II (INSTRUMENTS) ×1 IMPLANT
NAIL TRIGEN LEFT 10X38-125 (Nail) ×1 IMPLANT
NS IRRIG 1000ML POUR BTL (IV SOLUTION) ×2 IMPLANT
PACK GENERAL/GYN (CUSTOM PROCEDURE TRAY) ×2 IMPLANT
PAD ARMBOARD 7.5X6 YLW CONV (MISCELLANEOUS) ×4 IMPLANT
PAD CAST 4YDX4 CTTN HI CHSV (CAST SUPPLIES) ×2 IMPLANT
PADDING CAST COTTON 4X4 STRL (CAST SUPPLIES) ×2
PIN GUIDE 3.2X343MM (PIN) IMPLANT
SCREW LAG COMPR KIT 85/80 (Screw) ×1 IMPLANT
SCREW TRIGEN LOW PROF 5.0X47.5 (Screw) ×1 IMPLANT
STAPLER VISISTAT 35W (STAPLE) ×2 IMPLANT
SUT VIC AB 0 CT1 27 (SUTURE) ×1
SUT VIC AB 0 CT1 27XBRD ANBCTR (SUTURE) ×1 IMPLANT
SUT VIC AB 2-0 CT1 27 (SUTURE) ×1
SUT VIC AB 2-0 CT1 TAPERPNT 27 (SUTURE) ×1 IMPLANT
TOWEL GREEN STERILE (TOWEL DISPOSABLE) ×2 IMPLANT
TOWEL GREEN STERILE FF (TOWEL DISPOSABLE) ×2 IMPLANT
WATER STERILE IRR 1000ML POUR (IV SOLUTION) ×2 IMPLANT

## 2020-09-06 NOTE — Anesthesia Postprocedure Evaluation (Signed)
Anesthesia Post Note  Patient: Andrea Shepherd  Procedure(s) Performed: INTRAMEDULLARY (IM) NAIL INTERTROCHANTRIC (Left Hip)     Patient location during evaluation: PACU Anesthesia Type: General Level of consciousness: awake and alert Pain management: pain level controlled Vital Signs Assessment: post-procedure vital signs reviewed and stable Respiratory status: spontaneous breathing, nonlabored ventilation, respiratory function stable and patient connected to nasal cannula oxygen Cardiovascular status: blood pressure returned to baseline and stable Postop Assessment: no apparent nausea or vomiting Anesthetic complications: no   No complications documented.  Last Vitals:  Vitals:   08/28/2020 1212 09/05/2020 1215  BP: (!) 184/62 (!) 175/55  Pulse: 65 71  Resp:  18  Temp: (!) 36.3 C 36.6 C  SpO2: 94% 92%    Last Pain:  Vitals:   09/10/2020 1215  TempSrc: Oral  PainSc:                  March Rummage Mai Longnecker

## 2020-09-06 NOTE — Transfer of Care (Signed)
Immediate Anesthesia Transfer of Care Note  Patient: Negar Sieler  Procedure(s) Performed: INTRAMEDULLARY (IM) NAIL INTERTROCHANTRIC (Left Hip)  Patient Location: PACU  Anesthesia Type:General  Level of Consciousness: drowsy and patient cooperative  Airway & Oxygen Therapy: Patient Spontanous Breathing and Patient connected to face mask oxygen  Post-op Assessment: Report given to RN and Post -op Vital signs reviewed and stable  Post vital signs: Reviewed  Last Vitals:  Vitals Value Taken Time  BP 161/52 08/31/2020 1153  Temp    Pulse 56 08/20/2020 1157  Resp 16 08/26/2020 1157  SpO2 96 % 08/22/2020 1157  Vitals shown include unvalidated device data.  Last Pain:  Vitals:   08/22/2020 0917  TempSrc: Oral  PainSc:       Patients Stated Pain Goal: 1 (25/83/46 2194)  Complications: No complications documented.

## 2020-09-06 NOTE — H&P (Signed)

## 2020-09-06 NOTE — Discharge Instructions (Signed)
    1. Change dressings as needed 2. May shower but keep incisions covered and dry 3. Take plavix to prevent blood clots 4. Take stool softeners as needed 5. Take pain meds as needed

## 2020-09-06 NOTE — Anesthesia Procedure Notes (Signed)
Procedure Name: Intubation Date/Time: 09/02/2020 10:23 AM Performed by: Janene Harvey, CRNA Pre-anesthesia Checklist: Patient identified, Emergency Drugs available, Suction available and Patient being monitored Patient Re-evaluated:Patient Re-evaluated prior to induction Oxygen Delivery Method: Circle system utilized Preoxygenation: Pre-oxygenation with 100% oxygen Induction Type: IV induction Ventilation: Mask ventilation without difficulty Laryngoscope Size: Mac and 3 Grade View: Grade I Tube type: Oral Tube size: 7.0 mm Number of attempts: 1 Airway Equipment and Method: Stylet and Oral airway Placement Confirmation: ETT inserted through vocal cords under direct vision,  positive ETCO2 and breath sounds checked- equal and bilateral Secured at: 20 cm Tube secured with: Tape Dental Injury: Teeth and Oropharynx as per pre-operative assessment

## 2020-09-06 NOTE — Op Note (Signed)
Date of Surgery: 09/08/2020  INDICATIONS: Andrea Shepherd is a 86 y.o.-year-old female who sustained a left hip fracture. The risks and benefits of the procedure discussed with the patient prior to the procedure and all questions were answered; consent was obtained.  PREOPERATIVE DIAGNOSIS: left intertrochanteric hip fracture   POSTOPERATIVE DIAGNOSIS: Same   PROCEDURE: Open treatment of intertrochanteric fracture with intramedullary implant. CPT [phone]   SURGEON: N. Eduard Roux, M.D.   ASSIST: Madalyn Rob, [place]  ANESTHESIA: general   IV FLUIDS AND URINE: See anesthesia record   ESTIMATED BLOOD LOSS: 100 cc  IMPLANTS: [employer] InterTAN 10 x 38, 85/80 dual compression screws  DRAINS: None.   COMPLICATIONS: see description of procedure.   DESCRIPTION OF PROCEDURE: The patient was brought to the operating room and placed supine on the operating table. The patient's leg had been signed prior to the procedure. The patient had the anesthesia placed by the anesthesiologist. The prep verification and incision time-outs were performed to confirm that this was the correct patient, site, side and location. The patient had an SCD on the opposite lower extremity. The patient did receive antibiotics prior to the incision and was re-dosed during the procedure as needed at indicated intervals. The patient was positioned on the fracture table with the table in traction and internal rotation to reduce the hip.  The femoral neck remained slightly posteriorly translated.  I was not able to bring this back into anatomic reduction.  The well leg was placed in a scissor position and all bony prominences were well-padded. The patient had the lower extremity prepped and draped in the standard surgical fashion. The incision was made 4 finger breadths superior to the greater trochanter. A guide pin was inserted into the tip of the greater trochanter under fluoroscopic guidance. An opening reamer was used  to gain access to the femoral canal. The nail length was measured and inserted down the femoral canal to its proper depth. The appropriate version of insertion for the lag screw was found under fluoroscopy. A pin was inserted up the femoral neck through the jig. Then, a second antirotation pin was inserted inferior to the first pin. The length of the lag screw was then measured. The lag screw was inserted as near to center-center in the head as possible. The antirotation pin was then taken out and an interdigitating compression screw was placed in its place. The leg was taken out of traction, then the interdigitating compression screw was used to compress across the fracture. Compression was visualized on serial xrays.  A distal interlocking screw was placed using the perfect circle technique.  The wound was copiously irrigated with saline and the subcutaneous layer closed with 2.0 vicryl and the skin was reapproximated with staples. The wounds were cleaned and dried a final time and a sterile dressing was placed. The hip was taken through a range of motion at the end of the case under fluoroscopic imaging to visualize the approach-withdraw phenomenon and confirm implant length in the head. The patient was then awakened from anesthesia and taken to the recovery room in stable condition. All counts were correct at the end of the case.   Andrea Shepherd was necessary for opening, closing, retracting, limb positioning and overall facilitation and completion of the surgery.  POSTOPERATIVE PLAN: The patient will be weight bearing as tolerated and will return in 2 weeks for staple removal and the patient will receive DVT prophylaxis based on other medications, activity level, and risk  ratio of bleeding to thrombosis.   Andrea Cecil, MD [facility], [place] 11:16 AM

## 2020-09-06 NOTE — Anesthesia Preprocedure Evaluation (Signed)
Anesthesia Evaluation  Patient identified by MRN, date of birth, ID band Patient awake    Airway Mallampati: I  TM Distance: >3 FB Neck ROM: Full    Dental  (+) Edentulous Upper, Edentulous Lower   Pulmonary neg pulmonary ROS, Current Smoker,    Pulmonary exam normal        Cardiovascular hypertension, Pt. on medications + Peripheral Vascular Disease   Rhythm:Regular Rate:Normal     Neuro/Psych Anxiety negative neurological ROS     GI/Hepatic negative GI ROS, Neg liver ROS,   Endo/Other  negative endocrine ROS  Renal/GU CRFRenal disease  negative genitourinary   Musculoskeletal  (+) Arthritis , Osteoarthritis,  Left hip fx s/p fall at home   Abdominal (+)  Abdomen: soft. Bowel sounds: normal.  Peds  Hematology  (+) anemia ,   Anesthesia Other Findings   Reproductive/Obstetrics                             Anesthesia Physical Anesthesia Plan  ASA: III  Anesthesia Plan: General   Post-op Pain Management:    Induction: Intravenous  PONV Risk Score and Plan: 2 and Ondansetron, Dexamethasone and Treatment may vary due to age or medical condition  Airway Management Planned: Mask and Oral ETT  Additional Equipment: None  Intra-op Plan:   Post-operative Plan: Extubation in OR  Informed Consent: I have reviewed the patients History and Physical, chart, labs and discussed the procedure including the risks, benefits and alternatives for the proposed anesthesia with the patient or authorized representative who has indicated his/her understanding and acceptance.     Dental advisory given  Plan Discussed with: CRNA  Anesthesia Plan Comments: (Lab Results      Component                Value               Date                      WBC                      8.6                 09/05/2020                HGB                      8.3 (L)             09/05/2020                HCT                       24.8 (L)            08/27/2020                MCV                      99.2                08/17/2020                PLT                      264  08/28/2020           Lab Results      Component                Value               Date                      NA                       140                 08/31/2020                K                        4.2                 08/28/2020                CO2                      21 (L)              09/03/2020                GLUCOSE                  100 (H)             09/05/2020                BUN                      38 (H)              09/05/2020                CREATININE               2.72 (H)            08/20/2020                CALCIUM                  8.2 (L)             08/28/2020                GFRNONAA                 17 (L)              08/20/2020          )        Anesthesia Quick Evaluation

## 2020-09-06 NOTE — Progress Notes (Addendum)
PROGRESS NOTE    Andrea Shepherd  GGE:[id number] DOB: [birth year] DOA: 08/27/2020 PCP: Rochel Brome, MD   Brief Narrative: Andrea Shepherd is a 86 y.o. female with a history of PAD, hypertension, CKD stage IV, tobacco use, breast cancer status postmastectomy/radiation.  Patient presented secondary to a fall after getting up out of her and stubbing her toe on a chair.  She is found to have a left hip fracture.  Orthopedic surgery was consulted on admission.    Assessment & Plan:   Principal Problem:   Closed left hip fracture (HCC) Active Problems:   CKD (chronic kidney disease), stage IV (HCC)   Peripheral arterial occlusive disease (HCC)   Hypertensive urgency   Hypokalemia   Normocytic anemia   Closed fracture of 5th metacarpal   Pleural effusion, bilateral   Left hip fracture Orthopedic surgery consulted with plan for surgery on 5/27. -Tylenol prn for pain, Fentanyl IV prn for breakthrough -Orthopedic surgery recommendations: OR today  Left 5th metacarpal fracture Secondary to fall. Ulnar gutter splint placed in the ED -Orthopedic surgery recommendations: continue splint  Hypertensive urgency Patient is on diltiazem as an outpatient.  Blood pressure continues to be elevated -Continue Cardizem CD 120 mg daily -Increase to hydralazine 50 mg TID and titrate up for control  Bilateral pleural effusion Unknown etiology.  Patient with recent CT scan with small volume effusions at that time contributed to underlying COPD.  Concerning with patient's history of breast cancer in the past.  Patient is mildly symptomatic with mild cough and orthopnea. Cardiomegaly on imaging with an elevated BNP of 702. -Transthoracic Echocardiogram -Consider thoracentesis if Transthoracic Echocardiogram is unremarkable  CKD stage IV Baseline creatinine of about 2.6.  Creatinine is currently stable  PAD Claudication Patient has a history of femoral bypass bilaterally with right femoral artery stent  placed.  Patient is on Plavix as an outpatient.  She is also on simvastatin. -Continue simvastatin -Holding Plavix secondary to anticipated surgery; resume pending orthopedic surgery recommendations  Abdominal bruit With history of poorly controlled hypertension, recurrent hypertensive urgency, kidney disease, it is possible patient has a degree of renal artery stenosis. She has had vascular procedures performed in the past. Will avoid ACEi for treatment of blood pressure. -Will attempt medical management -Will consider renal duplex US if patient could benefit from surgical management should she have RAS   DVT prophylaxis: SCDs Code Status:   Code Status: Full Code Family Communication: None at bedside Disposition Plan: Discharge likely in 3-5 days pending orthopedic surgery management and eventual PT/OT recommendations   Consultants:   Orthopedic surgery  Procedures:   None  Antimicrobials:  None    Subjective: Still with leg pain but managed with analgesics.  Objective: Vitals:   09/05/20 2159 09/05/20 2223 09/05/20 2356 08/28/2020 0215  BP: (!) 211/52 (!) 177/44 (!) 177/44 (!) 195/52  Pulse: 81 86 86 85  Resp: 17 15 15 17   Temp: 98.4 F (36.9 C) 98.8 F (37.1 C) 98.8 F (37.1 C) 98.4 F (36.9 C)  TempSrc: Oral  Oral Oral  SpO2: 94%  93% 93%  Weight:      Height:        Intake/Output Summary (Last 24 hours) at 08/13/2020 0915 Last data filed at 08/28/2020 0300 Gross per 24 hour  Intake 100 ml  Output 400 ml  Net -300 ml   Filed Weights   08/22/2020 2208  Weight: 45.8 kg    Examination:  General exam: Appears calm and comfortable Respiratory system:  Clear to auscultation. Respiratory effort normal. Cardiovascular system: S1 & S2 heard, RRR. No murmurs, rubs, gallops or clicks. Mid-abdominal bruit heard Gastrointestinal system: Abdomen is nondistended, soft and nontender. No organomegaly or masses felt. Normal bowel sounds heard. Central nervous system:  Alert and oriented. No focal neurological deficits. Musculoskeletal: No edema. No calf tenderness Skin: No cyanosis. No rashes Psychiatry: Judgement and insight appear normal. Mood & affect appropriate.     Data Reviewed: I have personally reviewed following labs and imaging studies  CBC Lab Results  Component Value Date   WBC 8.6 08/31/2020   RBC 2.50 (L) 09/05/2020   HGB 8.3 (L) 08/21/2020   HCT 24.8 (L) 08/31/2020   MCV 99.2 09/08/2020   MCH 33.2 09/09/2020   PLT 264 08/15/2020   MCHC 33.5 08/24/2020   RDW 14.6 08/27/2020   LYMPHSABS 1.0 08/29/2020   MONOABS 0.6 09/02/2020   EOSABS 0.0 08/19/2020   BASOSABS 0.0 17/79/3903     Last metabolic panel Lab Results  Component Value Date   NA 140 09/02/2020   K 4.2 09/03/2020   CL 109 08/24/2020   CO2 21 (L) 08/26/2020   BUN 38 (H) 09/10/2020   CREATININE 2.72 (H) 08/11/2020   GLUCOSE 100 (H) 08/17/2020   GFRNONAA 17 (L) 08/29/2020   CALCIUM 8.2 (L) 08/18/2020   PROT 6.5 06/21/2020   ALBUMIN 3.8 06/21/2020   LABGLOB 2.7 06/21/2020   AGRATIO 1.4 06/21/2020   BILITOT 0.3 06/21/2020   ALKPHOS 86 06/21/2020   AST 22 06/21/2020   ALT 12 06/21/2020   ANIONGAP 10 08/13/2020    CBG (last 3)  No results for input(s): GLUCAP in the last 72 hours.   GFR: Estimated Creatinine Clearance: 11.3 mL/min (A) (by C-G formula based on SCr of 2.72 mg/dL (H)).  Coagulation Profile: Recent Labs  Lab 09/02/2020 2205  INR 0.9    Recent Results (from the past 240 hour(s))  Resp Panel by RT-PCR (Flu A&B, Covid) Nasopharyngeal Swab     Status: None   Collection Time: 08/12/2020 10:15 PM   Specimen: Nasopharyngeal Swab; Nasopharyngeal(NP) swabs in vial transport medium  Result Value Ref Range Status   SARS Coronavirus 2 by RT PCR NEGATIVE NEGATIVE Final    Comment: (NOTE) SARS-CoV-2 target nucleic acids are NOT DETECTED.  The SARS-CoV-2 RNA is generally detectable in upper respiratory specimens during the acute phase of infection.  The lowest concentration of SARS-CoV-2 viral copies this assay can detect is 138 copies/mL. A negative result does not preclude SARS-Cov-2 infection and should not be used as the sole basis for treatment or other patient management decisions. A negative result may occur with  improper specimen collection/handling, submission of specimen other than nasopharyngeal swab, presence of viral mutation(s) within the areas targeted by this assay, and inadequate number of viral copies(<138 copies/mL). A negative result must be combined with clinical observations, patient history, and epidemiological information. The expected result is Negative.  Fact Sheet for Patients:  EntrepreneurPulse.com.au  Fact Sheet for Healthcare Providers:  IncredibleEmployment.be  This test is no t yet approved or cleared by the [place] FDA and  has been authorized for detection and/or diagnosis of SARS-CoV-2 by FDA under an Emergency Use Authorization (EUA). This EUA will remain  in effect (meaning this test can be used) for the duration of the COVID-19 declaration under Section 564(b)(1) of the Act, 21 U.S.C.section 360bbb-3(b)(1), unless the authorization is terminated  or revoked sooner.       Influenza A by PCR NEGATIVE  NEGATIVE Final   Influenza B by PCR NEGATIVE NEGATIVE Final    Comment: (NOTE) The Xpert Xpress SARS-CoV-2/FLU/RSV plus assay is intended as an aid in the diagnosis of influenza from Nasopharyngeal swab specimens and should not be used as a sole basis for treatment. Nasal washings and aspirates are unacceptable for Xpert Xpress SARS-CoV-2/FLU/RSV testing.  Fact Sheet for Patients: EntrepreneurPulse.com.au  Fact Sheet for Healthcare Providers: IncredibleEmployment.be  This test is not yet approved or cleared by the [place] FDA and has been authorized for detection and/or diagnosis of SARS-CoV-2 by FDA  under an Emergency Use Authorization (EUA). This EUA will remain in effect (meaning this test can be used) for the duration of the COVID-19 declaration under Section 564(b)(1) of the Act, 21 U.S.C. section 360bbb-3(b)(1), unless the authorization is terminated or revoked.  Performed at [facility], [place] [address], [place], [place] [address]   Surgical pcr screen     Status: None   Collection Time: 09/05/20  6:59 PM   Specimen: Nasal Mucosa; Nasal Swab  Result Value Ref Range Status   MRSA, PCR NEGATIVE NEGATIVE Final   Staphylococcus aureus NEGATIVE NEGATIVE Final    Comment: (NOTE) The Xpert SA Assay (FDA approved for NASAL specimens in patients 62 years of age and older), is one component of a comprehensive surveillance program. It is not intended to diagnose infection nor to guide or monitor treatment. Performed at [facility], [place] [address] [place], [place] [address]         Radiology Studies: CT Head Wo Contrast  Result Date: 08/20/2020 CLINICAL DATA:  Fall, tripped on stool. Head trauma, minor (Age >= 65y) EXAM: CT HEAD WITHOUT CONTRAST TECHNIQUE: Contiguous axial images were obtained from the base of the skull through the vertex without intravenous contrast. COMPARISON:  Head CT 01/21/2016 FINDINGS: Brain: No intracranial hemorrhage, mass effect, or midline shift. Normal brain volume for age. Moderate periventricular and deep chronic small vessel ischemia. No hydrocephalus. The basilar cisterns are patent. No evidence of territorial infarct or acute ischemia. No extra-axial or intracranial fluid collection. Vascular: Atherosclerosis of skullbase vasculature without hyperdense vessel or abnormal calcification. Skull: No fracture or focal lesion. Sinuses/Orbits: Resolved right mastoid effusion. Paranasal sinuses and mastoid air cells are clear. Small mucous retention cyst left maxillary sinus. Bilateral lens extraction. Other: None. IMPRESSION: 1. No acute  intracranial abnormality. No skull fracture. 2. Moderate chronic small vessel ischemia. Electronically Signed   By: Keith Rake M.D.   On: 08/16/2020 23:07   CT CERVICAL SPINE WO CONTRAST  Result Date: 08/13/2020 CLINICAL DATA:  Neck pain, chronic, no prior imaging Fall, trip on stool. EXAM: CT CERVICAL SPINE WITHOUT CONTRAST TECHNIQUE: Multidetector CT imaging of the cervical spine was performed without intravenous contrast. Multiplanar CT image reconstructions were also generated. COMPARISON:  None. FINDINGS: Alignment: No traumatic subluxation. Trace retrolisthesis of C3 on C4. Skull base and vertebrae: No acute fracture. Vertebral body heights are maintained. The dens and skull base are intact. Bone island within C7 vertebral body. Soft tissues and spinal canal: No prevertebral fluid or swelling. No visible canal hematoma. Disc levels: Diffuse degenerative disc disease, disc space narrowing is most prominent at C3-C4, C5-C6 and C6-C7. posterior disc osteophyte complex at C5-C6 and C4-C5 on the right causes narrowing of the vertebral foramen and mild mass effect on the canal. Upper chest: Layering pleural effusions, at least moderate in size. No apical pneumothorax. Other: Carotid calcifications. IMPRESSION: 1. Multilevel degenerative disc disease throughout the cervical  spine without acute fracture or subluxation. 2. Posterior disc osteophyte complex at C5-C6 and C4-C5 on the right causes narrowing of the vertebral foramen and mild mass effect on the canal. 3. Layering pleural effusions at least moderate in size. Electronically Signed   By: Keith Rake M.D.   On: 08/31/2020 23:11   DG Chest Port 1 View  Result Date: 09/08/2020 CLINICAL DATA:  Fall, hip fracture, hand fracture, chest pain EXAM: PORTABLE CHEST 1 VIEW COMPARISON:  06/21/2020 FINDINGS: The lungs are mildly hyperinflated. Small bilateral pleural effusions are present resulting in gradual basilar opacification on the right. No  pneumothorax. Mild cardiomegaly is stable. Pulmonary vascularity is normal. Surgical clips are seen within the right axilla. No acute bone abnormality. IMPRESSION: Small bilateral pleural effusions. Stable cardiomegaly. COPD. Electronically Signed   By: Fidela Salisbury MD   On: 09/01/2020 22:57   DG Hand Complete Left  Result Date: 08/29/2020 CLINICAL DATA:  Fall, left hand pain EXAM: LEFT HAND - COMPLETE 3+ VIEW COMPARISON:  None. FINDINGS: Three view radiograph left hand demonstrates an acute, transverse, extra-articular fracture of the mid to distal diaphysis of the left fifth metacarpal with moderate volar angulation of the distal fracture fragment. No other fracture or dislocation identified. Joint spaces are preserved. Vascular calcifications are seen within the soft tissues. Soft tissue swelling is seen lateral to the fracture plane. IMPRESSION: Acute transverse angulated extra-articular fracture of the distal left fifth metacarpal diaphysis. Electronically Signed   By: Fidela Salisbury MD   On: 08/27/2020 22:55   DG Hip Unilat W or Wo Pelvis 2-3 Views Left  Result Date: 08/22/2020 CLINICAL DATA:  Fall, left hip deformity EXAM: DG HIP (WITH OR WITHOUT PELVIS) 2-3V LEFT COMPARISON:  06/05/2017 FINDINGS: There is an acute, intratrochanteric fracture of the left hip with marked medial and posterior angulation, avulsion of the lesser trochanter, and mild impaction. The femoral head is still seated within the left acetabulum. Mild superimposed left hip degenerative arthritis. The pelvis and right hip are intact. Moderate right hip degenerative arthritis. Advanced vascular calcifications are seen within the pelvis and medial thighs bilaterally. Bilateral lower extremity arterial inflow stenting has been performed. Involuted fibroid noted within the pelvis. IMPRESSION: Acute, impacted, markedly angulated intratrochanteric fracture of the left hip with avulsion of the lesser trochanter. Femoral head is still  seated within the left acetabulum. Mild left hip degenerative arthritis. Electronically Signed   By: Fidela Salisbury MD   On: 08/23/2020 22:54        Scheduled Meds: . [MAR Hold] sodium chloride   Intravenous Once  . [MAR Hold] diltiazem  120 mg Oral BID  . [MAR Hold] hydrALAZINE  25 mg Oral Q8H  . [MAR Hold] simvastatin  20 mg Oral q1800   Continuous Infusions:   LOS: 1 day     Cordelia Poche, MD [facility] 08/15/2020, 9:15 AM  If 7PM-7AM, please contact night-coverage [email or web address]

## 2020-09-06 NOTE — Progress Notes (Signed)
Initial Nutrition Assessment  DOCUMENTATION CODES:   Underweight  INTERVENTION:   -Once diet is advanced, add:  -Ensure Enlive po TID, each supplement provides 350 kcal and 20 grams of protein -MVI with minerals daily  NUTRITION DIAGNOSIS:   Increased nutrient needs related to post-op healing as evidenced by estimated needs.  GOAL:   Patient will meet greater than or equal to 90% of their needs  MONITOR:   PO intake,Supplement acceptance,Diet advancement,Labs,Weight trends,Skin,I & O's  REASON FOR ASSESSMENT:   Consult Assessment of nutrition requirement/status,Hip fracture protocol  ASSESSMENT:   Andrea Shepherd is a 86 y.o. female with medical history significant for peripheral arterial disease, hypertension, chronic kidney disease stage IV, and current smoker, now presenting to the emergency department for evaluation of severe left hip and hand pain after a fall at home.  Pt admitted with lt hip fracture.   Reviewed I/O's: -300 ml x 24 hours  UOP: 400 ml x 24 hours  Per orthopedics notes, plan for IMN today. Pt is currently NPO for procedure.   Pt unavailable at time of visit. RD unable to obtain further nutrition-related history or complete nutrition-focused physical exam at this time.   Reviewed wt hx; pt has experienced a 3.8% wt loss over the past 2 months, which is not significant for time frame.   Pt at high risk for malnutrition, however, unable to identify at this time. Pt would greatly benefit from addition of oral nutrition supplements.  Medications reviewed and include cardizem and 0.9% sodium chloride infusion @ 10 ml/hr.  Labs reviewed.   Diet Order:   Diet Order            Diet NPO time specified Except for: Sips with Meds  Diet effective now                 EDUCATION NEEDS:   No education needs have been identified at this time  Skin:  Skin Assessment: Reviewed RN Assessment  Last BM:  Unknown  Height:   Ht Readings from Last 1  Encounters:  08/16/2020 5\' 8"  (1.727 m)    Weight:   Wt Readings from Last 1 Encounters:  08/27/2020 45.8 kg    Ideal Body Weight:  63.6 kg  BMI:  Body mass index is 15.36 kg/m.  Estimated Nutritional Needs:   Kcal:  1400-1600  Protein:  70-85 grams  Fluid:  > 1.4 L    Loistine Chance, RD, LDN, [place] Registered Dietitian II Certified Diabetes Care and Education Specialist Please refer to [facility] for RD and/or RD on-call/weekend/after hours pager

## 2020-09-07 ENCOUNTER — Inpatient Hospital Stay (HOSPITAL_COMMUNITY): Payer: Medicare Other | Admitting: Anesthesiology

## 2020-09-07 ENCOUNTER — Inpatient Hospital Stay (HOSPITAL_COMMUNITY): Payer: Medicare Other

## 2020-09-07 ENCOUNTER — Encounter (HOSPITAL_COMMUNITY): Payer: Self-pay | Admitting: Family Medicine

## 2020-09-07 ENCOUNTER — Encounter (HOSPITAL_COMMUNITY): Admission: EM | Disposition: E | Payer: Self-pay | Source: Home / Self Care | Attending: Family Medicine

## 2020-09-07 DIAGNOSIS — S72002A Fracture of unspecified part of neck of left femur, initial encounter for closed fracture: Secondary | ICD-10-CM | POA: Diagnosis not present

## 2020-09-07 DIAGNOSIS — I160 Hypertensive urgency: Secondary | ICD-10-CM | POA: Diagnosis not present

## 2020-09-07 DIAGNOSIS — N184 Chronic kidney disease, stage 4 (severe): Secondary | ICD-10-CM | POA: Diagnosis not present

## 2020-09-07 DIAGNOSIS — I517 Cardiomegaly: Secondary | ICD-10-CM | POA: Diagnosis not present

## 2020-09-07 DIAGNOSIS — I129 Hypertensive chronic kidney disease with stage 1 through stage 4 chronic kidney disease, or unspecified chronic kidney disease: Secondary | ICD-10-CM

## 2020-09-07 DIAGNOSIS — M62262 Nontraumatic ischemic infarction of muscle, left lower leg: Secondary | ICD-10-CM

## 2020-09-07 DIAGNOSIS — F17210 Nicotine dependence, cigarettes, uncomplicated: Secondary | ICD-10-CM

## 2020-09-07 DIAGNOSIS — E876 Hypokalemia: Secondary | ICD-10-CM | POA: Diagnosis not present

## 2020-09-07 DIAGNOSIS — I70222 Atherosclerosis of native arteries of extremities with rest pain, left leg: Secondary | ICD-10-CM

## 2020-09-07 DIAGNOSIS — N189 Chronic kidney disease, unspecified: Secondary | ICD-10-CM

## 2020-09-07 DIAGNOSIS — I7092 Chronic total occlusion of artery of the extremities: Secondary | ICD-10-CM

## 2020-09-07 DIAGNOSIS — Z9889 Other specified postprocedural states: Secondary | ICD-10-CM

## 2020-09-07 HISTORY — PX: ULTRASOUND GUIDANCE FOR VASCULAR ACCESS: SHX6516

## 2020-09-07 HISTORY — PX: PATCH ANGIOPLASTY: SHX6230

## 2020-09-07 HISTORY — PX: ENDARTERECTOMY FEMORAL: SHX5804

## 2020-09-07 HISTORY — PX: AORTOGRAM: SHX6300

## 2020-09-07 HISTORY — PX: INTRAOPERATIVE ARTERIOGRAM: SHX5157

## 2020-09-07 LAB — POCT I-STAT 7, (LYTES, BLD GAS, ICA,H+H)
Acid-base deficit: 6 mmol/L — ABNORMAL HIGH (ref 0.0–2.0)
Bicarbonate: 20 mmol/L (ref 20.0–28.0)
Calcium, Ion: 1.16 mmol/L (ref 1.15–1.40)
HCT: 15 % — ABNORMAL LOW (ref 36.0–46.0)
Hemoglobin: 5.1 g/dL — CL (ref 12.0–15.0)
O2 Saturation: 100 %
Potassium: 5.2 mmol/L — ABNORMAL HIGH (ref 3.5–5.1)
Sodium: 135 mmol/L (ref 135–145)
TCO2: 21 mmol/L — ABNORMAL LOW (ref 22–32)
pCO2 arterial: 43.5 mmHg (ref 32.0–48.0)
pH, Arterial: 7.27 — ABNORMAL LOW (ref 7.350–7.450)
pO2, Arterial: 196 mmHg — ABNORMAL HIGH (ref 83.0–108.0)

## 2020-09-07 LAB — BASIC METABOLIC PANEL
Anion gap: 14 (ref 5–15)
BUN: 49 mg/dL — ABNORMAL HIGH (ref 8–23)
CO2: 16 mmol/L — ABNORMAL LOW (ref 22–32)
Calcium: 8.2 mg/dL — ABNORMAL LOW (ref 8.9–10.3)
Chloride: 106 mmol/L (ref 98–111)
Creatinine, Ser: 2.86 mg/dL — ABNORMAL HIGH (ref 0.44–1.00)
GFR, Estimated: 16 mL/min — ABNORMAL LOW (ref >60)
Glucose, Bld: 165 mg/dL — ABNORMAL HIGH (ref 70–99)
Potassium: 4.5 mmol/L (ref 3.5–5.1)
Sodium: 136 mmol/L (ref 135–145)

## 2020-09-07 LAB — ECHOCARDIOGRAM COMPLETE
Area-P 1/2: 3.85 cm2
Height: 68 in
P 1/2 time: 379 msec
S' Lateral: 2.8 cm
Weight: 1616 oz

## 2020-09-07 LAB — CBC
HCT: 21.8 % — ABNORMAL LOW (ref 36.0–46.0)
Hemoglobin: 7.1 g/dL — ABNORMAL LOW (ref 12.0–15.0)
MCH: 33.5 pg (ref 26.0–34.0)
MCHC: 32.6 g/dL (ref 30.0–36.0)
MCV: 102.8 fL — ABNORMAL HIGH (ref 80.0–100.0)
Platelets: 247 10*3/uL (ref 150–400)
RBC: 2.12 MIL/uL — ABNORMAL LOW (ref 3.87–5.11)
RDW: 14.9 % (ref 11.5–15.5)
WBC: 8.6 10*3/uL (ref 4.0–10.5)
nRBC: 0 % (ref 0.0–0.2)

## 2020-09-07 LAB — HEMOGLOBIN AND HEMATOCRIT, BLOOD
HCT: 21.8 % — ABNORMAL LOW (ref 36.0–46.0)
Hemoglobin: 7.1 g/dL — ABNORMAL LOW (ref 12.0–15.0)

## 2020-09-07 LAB — POCT I-STAT, CHEM 8
BUN: 49 mg/dL — ABNORMAL HIGH (ref 8–23)
Calcium, Ion: 1.06 mmol/L — ABNORMAL LOW (ref 1.15–1.40)
Chloride: 109 mmol/L (ref 98–111)
Creatinine, Ser: 3.1 mg/dL — ABNORMAL HIGH (ref 0.44–1.00)
Glucose, Bld: 131 mg/dL — ABNORMAL HIGH (ref 70–99)
HCT: 27 % — ABNORMAL LOW (ref 36.0–46.0)
Hemoglobin: 9.2 g/dL — ABNORMAL LOW (ref 12.0–15.0)
Potassium: 5.5 mmol/L — ABNORMAL HIGH (ref 3.5–5.1)
Sodium: 134 mmol/L — ABNORMAL LOW (ref 135–145)
TCO2: 21 mmol/L — ABNORMAL LOW (ref 22–32)

## 2020-09-07 LAB — POCT ACTIVATED CLOTTING TIME
Activated Clotting Time: 219 seconds
Activated Clotting Time: 237 seconds

## 2020-09-07 LAB — PREPARE RBC (CROSSMATCH)

## 2020-09-07 SURGERY — AORTOGRAM
Anesthesia: General | Site: Groin | Laterality: Right

## 2020-09-07 MED ORDER — IODIXANOL 320 MG/ML IV SOLN
INTRAVENOUS | Status: DC | PRN
  Administered 2020-09-07: 30 mL

## 2020-09-07 MED ORDER — EPHEDRINE SULFATE 50 MG/ML IJ SOLN
INTRAMUSCULAR | Status: DC | PRN
  Administered 2020-09-07 (×2): 10 mg via INTRAVENOUS

## 2020-09-07 MED ORDER — SODIUM CHLORIDE 0.9 % IV SOLN: INTRAVENOUS | Status: DC | PRN

## 2020-09-07 MED ORDER — CEFAZOLIN SODIUM-DEXTROSE 2-4 GM/100ML-% IV SOLN
INTRAVENOUS | Status: AC
  Filled 2020-09-07: qty 100

## 2020-09-07 MED ORDER — SUGAMMADEX SODIUM 200 MG/2ML IV SOLN
INTRAVENOUS | Status: DC | PRN
  Administered 2020-09-07: 100 mg via INTRAVENOUS

## 2020-09-07 MED ORDER — PROPOFOL 10 MG/ML IV BOLUS
INTRAVENOUS | Status: AC
  Filled 2020-09-07: qty 20

## 2020-09-07 MED ORDER — PHENYLEPHRINE HCL-NACL 10-0.9 MG/250ML-% IV SOLN
INTRAVENOUS | Status: DC | PRN
  Administered 2020-09-07: 50 ug/min via INTRAVENOUS

## 2020-09-07 MED ORDER — SODIUM CHLORIDE 0.9 % IV SOLN
INTRAVENOUS | Status: AC
  Filled 2020-09-07: qty 1.2

## 2020-09-07 MED ORDER — ALBUTEROL SULFATE (2.5 MG/3ML) 0.083% IN NEBU
INHALATION_SOLUTION | RESPIRATORY_TRACT | Status: AC
  Administered 2020-09-07: 2.5 mg
  Filled 2020-09-07: qty 3

## 2020-09-07 MED ORDER — GABAPENTIN 100 MG PO CAPS
100.0000 mg | ORAL_CAPSULE | Freq: Every day | ORAL | Status: DC
  Administered 2020-09-08 – 2020-09-11 (×4): 100 mg via ORAL
  Filled 2020-09-07 (×4): qty 1

## 2020-09-07 MED ORDER — SUCCINYLCHOLINE CHLORIDE 20 MG/ML IJ SOLN
INTRAMUSCULAR | Status: DC | PRN
  Administered 2020-09-07: 40 mg via INTRAVENOUS

## 2020-09-07 MED ORDER — PROTAMINE SULFATE 10 MG/ML IV SOLN
INTRAVENOUS | Status: DC | PRN
  Administered 2020-09-07 (×4): 10 mg via INTRAVENOUS

## 2020-09-07 MED ORDER — 0.9 % SODIUM CHLORIDE (POUR BTL) OPTIME
TOPICAL | Status: DC | PRN
  Administered 2020-09-07: 3000 mL

## 2020-09-07 MED ORDER — SODIUM CHLORIDE 0.9% IV SOLUTION: Freq: Once | INTRAVENOUS | Status: DC

## 2020-09-07 MED ORDER — LABETALOL HCL 5 MG/ML IV SOLN
5.0000 mg | Freq: Once | INTRAVENOUS | Status: AC
  Administered 2020-09-07: 5 mg via INTRAVENOUS

## 2020-09-07 MED ORDER — PROPOFOL 10 MG/ML IV BOLUS
INTRAVENOUS | Status: DC | PRN
  Administered 2020-09-07: 50 mg via INTRAVENOUS

## 2020-09-07 MED ORDER — FENTANYL CITRATE (PF) 100 MCG/2ML IJ SOLN
INTRAMUSCULAR | Status: DC | PRN
  Administered 2020-09-07 (×2): 25 ug via INTRAVENOUS
  Administered 2020-09-07: 50 ug via INTRAVENOUS

## 2020-09-07 MED ORDER — CHLORHEXIDINE GLUCONATE 0.12 % MT SOLN
OROMUCOSAL | Status: AC
  Administered 2020-09-07: 15 mL via OROMUCOSAL
  Filled 2020-09-07: qty 15

## 2020-09-07 MED ORDER — ROCURONIUM BROMIDE 10 MG/ML (PF) SYRINGE
PREFILLED_SYRINGE | INTRAVENOUS | Status: DC | PRN
  Administered 2020-09-07: 30 mg via INTRAVENOUS
  Administered 2020-09-07: 20 mg via INTRAVENOUS

## 2020-09-07 MED ORDER — LABETALOL HCL 5 MG/ML IV SOLN
INTRAVENOUS | Status: DC | PRN
  Administered 2020-09-07: 5 mg via INTRAVENOUS

## 2020-09-07 MED ORDER — CEFAZOLIN SODIUM-DEXTROSE 2-3 GM-%(50ML) IV SOLR
INTRAVENOUS | Status: DC | PRN
  Administered 2020-09-07: 2 g via INTRAVENOUS

## 2020-09-07 MED ORDER — HEMOSTATIC AGENTS (NO CHARGE) OPTIME
TOPICAL | Status: DC | PRN
  Administered 2020-09-07: 1 via TOPICAL

## 2020-09-07 MED ORDER — HEPARIN SODIUM (PORCINE) 1000 UNIT/ML IJ SOLN
INTRAMUSCULAR | Status: DC | PRN
  Administered 2020-09-07: 5000 [IU] via INTRAVENOUS
  Administered 2020-09-07: 2000 [IU] via INTRAVENOUS

## 2020-09-07 MED ORDER — SODIUM CHLORIDE 0.9 % IV SOLN
INTRAVENOUS | Status: DC | PRN
  Administered 2020-09-07: 500 mL

## 2020-09-07 MED ORDER — DEXAMETHASONE SODIUM PHOSPHATE 10 MG/ML IJ SOLN
INTRAMUSCULAR | Status: DC | PRN
  Administered 2020-09-07: 5 mg via INTRAVENOUS

## 2020-09-07 MED ORDER — FENTANYL CITRATE (PF) 250 MCG/5ML IJ SOLN
INTRAMUSCULAR | Status: AC
  Filled 2020-09-07: qty 5

## 2020-09-07 MED ORDER — CHLORHEXIDINE GLUCONATE 0.12 % MT SOLN
15.0000 mL | Freq: Once | OROMUCOSAL | Status: AC
  Administered 2020-09-09: 15 mL via OROMUCOSAL
  Filled 2020-09-07 (×2): qty 15

## 2020-09-07 MED ORDER — ONDANSETRON HCL 4 MG/2ML IJ SOLN
INTRAMUSCULAR | Status: DC | PRN
  Administered 2020-09-07: 4 mg via INTRAVENOUS

## 2020-09-07 MED ORDER — LACTATED RINGERS IV SOLN: INTRAVENOUS | Status: DC | PRN

## 2020-09-07 SURGICAL SUPPLY — 96 items
BAG BANDED W/RUBBER/TAPE 36X54 (MISCELLANEOUS) ×5 IMPLANT
BANDAGE ESMARK 6X9 LF (GAUZE/BANDAGES/DRESSINGS) IMPLANT
BLADE SURG 11 STRL SS (BLADE) ×5 IMPLANT
BLADE SURG 15 STRL LF DISP TIS (BLADE) ×3 IMPLANT
BLADE SURG 15 STRL SS (BLADE) ×2
BNDG ESMARK 6X9 LF (GAUZE/BANDAGES/DRESSINGS)
CANISTER SUCT 3000ML PPV (MISCELLANEOUS) ×5 IMPLANT
CATH ANGIO BERNSTEIN 5X40X.035 (CATHETERS) ×5 IMPLANT
CATH CROSS OVER TEMPO 5F (CATHETERS) ×5 IMPLANT
CATH EMB 4FR 80CM (CATHETERS) ×5 IMPLANT
CATH OMNI FLUSH 5F 65CM (CATHETERS) ×5 IMPLANT
CATH STRAIGHT 5FR 65CM (CATHETERS) ×5 IMPLANT
CHLORAPREP W/TINT 26 (MISCELLANEOUS) ×5 IMPLANT
CLIP VESOCCLUDE MED 24/CT (CLIP) ×5 IMPLANT
CLIP VESOCCLUDE SM WIDE 24/CT (CLIP) ×5 IMPLANT
COVER BACK TABLE 80X110 HD (DRAPES) ×10 IMPLANT
COVER DOME SNAP 22 D (MISCELLANEOUS) ×5 IMPLANT
COVER PROBE W GEL 5X96 (DRAPES) ×5 IMPLANT
COVER SURGICAL LIGHT HANDLE (MISCELLANEOUS) ×5 IMPLANT
COVER WAND RF STERILE (DRAPES) ×5 IMPLANT
CUFF TOURN SGL QUICK 24 (TOURNIQUET CUFF)
CUFF TOURN SGL QUICK 34 (TOURNIQUET CUFF)
CUFF TOURN SGL QUICK 42 (TOURNIQUET CUFF) IMPLANT
CUFF TRNQT CYL 24X4X16.5-23 (TOURNIQUET CUFF) IMPLANT
CUFF TRNQT CYL 34X4.125X (TOURNIQUET CUFF) IMPLANT
DERMABOND ADVANCED (GAUZE/BANDAGES/DRESSINGS) ×2
DERMABOND ADVANCED .7 DNX12 (GAUZE/BANDAGES/DRESSINGS) ×3 IMPLANT
DEVICE TORQUE KENDALL .025-038 (MISCELLANEOUS) ×5 IMPLANT
DRAIN CHANNEL 15F RND FF W/TCR (WOUND CARE) IMPLANT
DRAPE C-ARM 42X72 X-RAY (DRAPES) IMPLANT
DRAPE FEMORAL ANGIO 80X135IN (DRAPES) IMPLANT
ELECT REM PT RETURN 9FT ADLT (ELECTROSURGICAL) ×5
ELECTRODE REM PT RTRN 9FT ADLT (ELECTROSURGICAL) ×3 IMPLANT
EVACUATOR SILICONE 100CC (DRAIN) IMPLANT
FILTER CO2 0.2 MICRON (VASCULAR PRODUCTS) IMPLANT
FILTER CO2 INSUFFLATOR AX1008 (MISCELLANEOUS) IMPLANT
GAUZE 4X4 16PLY RFD (DISPOSABLE) ×5 IMPLANT
GLIDEWIRE ADV .035X180CM (WIRE) ×5 IMPLANT
GLOVE BIO SURGEON STRL SZ7.5 (GLOVE) ×5 IMPLANT
GLOVE INDICATOR 8.0 STRL GRN (GLOVE) ×5 IMPLANT
GLOVE SRG 8 PF TXTR STRL LF DI (GLOVE) ×3 IMPLANT
GLOVE SURG UNDER POLY LF SZ8 (GLOVE) ×2
GOWN STRL REUS W/ TWL LRG LVL3 (GOWN DISPOSABLE) ×6 IMPLANT
GOWN STRL REUS W/ TWL XL LVL3 (GOWN DISPOSABLE) ×6 IMPLANT
GOWN STRL REUS W/TWL LRG LVL3 (GOWN DISPOSABLE) ×4
GOWN STRL REUS W/TWL XL LVL3 (GOWN DISPOSABLE) ×4
GRAFT VASC PATCH XENOSURE 1X14 (Vascular Products) ×5 IMPLANT
GUIDEWIRE ANGLED .035X150CM (WIRE) IMPLANT
HEMOSTAT SNOW SURGICEL 2X4 (HEMOSTASIS) ×5 IMPLANT
HEMOSTAT SPONGE AVITENE ULTRA (HEMOSTASIS) IMPLANT
INSERT FOGARTY SM (MISCELLANEOUS) IMPLANT
KIT BASIN OR (CUSTOM PROCEDURE TRAY) ×5 IMPLANT
KIT MICROPUNCTURE NIT STIFF (SHEATH) ×5 IMPLANT
KIT TURNOVER KIT B (KITS) ×5 IMPLANT
NEEDLE PERC 18GX7CM (NEEDLE) IMPLANT
NS IRRIG 1000ML POUR BTL (IV SOLUTION) ×10 IMPLANT
PACK PERIPHERAL VASCULAR (CUSTOM PROCEDURE TRAY) ×5 IMPLANT
PAD ARMBOARD 7.5X6 YLW CONV (MISCELLANEOUS) ×10 IMPLANT
PATCH VASC XENOSURE 1CMX6CM (Vascular Products) IMPLANT
PATCH VASC XENOSURE 1X6 (Vascular Products) IMPLANT
PROTECTION STATION PRESSURIZED (MISCELLANEOUS)
SET FLUSH CO2 (MISCELLANEOUS) IMPLANT
SET MICROPUNCTURE 5F STIFF (MISCELLANEOUS) IMPLANT
SHEATH PINNACLE 5F 10CM (SHEATH) ×5 IMPLANT
STAPLER VISISTAT 35W (STAPLE) IMPLANT
STATION PROTECTION PRESSURIZED (MISCELLANEOUS) IMPLANT
STOPCOCK 3 WAY HIGH PRESSURE (MISCELLANEOUS) ×2
STOPCOCK 3WAY HIGH PRESSURE (MISCELLANEOUS) ×3 IMPLANT
STOPCOCK 4 WAY LG BORE MALE ST (IV SETS) ×5 IMPLANT
SUT ETHILON 3 0 PS 1 (SUTURE) IMPLANT
SUT GORETEX 5 0 TT13 24 (SUTURE) IMPLANT
SUT GORETEX 6.0 TT13 (SUTURE) IMPLANT
SUT MNCRL AB 4-0 PS2 18 (SUTURE) ×5 IMPLANT
SUT PROLENE 5 0 C 1 24 (SUTURE) ×20 IMPLANT
SUT PROLENE 6 0 BV (SUTURE) ×10 IMPLANT
SUT PROLENE 7 0 BV 1 (SUTURE) IMPLANT
SUT SILK 2 0 PERMA HAND 18 BK (SUTURE) IMPLANT
SUT SILK 3 0 (SUTURE)
SUT SILK 3-0 18XBRD TIE 12 (SUTURE) IMPLANT
SUT VIC AB 2-0 CT1 27 (SUTURE) ×4
SUT VIC AB 2-0 CT1 TAPERPNT 27 (SUTURE) ×6 IMPLANT
SUT VIC AB 3-0 SH 27 (SUTURE) ×2
SUT VIC AB 3-0 SH 27X BRD (SUTURE) ×3 IMPLANT
SYR 10ML LL (SYRINGE) ×20 IMPLANT
SYR 20ML LL LF (SYRINGE) ×10 IMPLANT
SYR 30ML LL (SYRINGE) IMPLANT
SYR 3ML LL SCALE MARK (SYRINGE) ×5 IMPLANT
SYR MEDRAD MARK V 150ML (SYRINGE) IMPLANT
TOWEL GREEN STERILE (TOWEL DISPOSABLE) ×10 IMPLANT
TRAY FOLEY MTR SLVR 16FR STAT (SET/KITS/TRAYS/PACK) ×5 IMPLANT
TUBING EXTENTION W/L.L. (IV SETS) IMPLANT
TUBING HIGH PRESSURE 120CM (CONNECTOR) IMPLANT
TUBING INJECTOR 48 (MISCELLANEOUS) ×5 IMPLANT
UNDERPAD 30X36 HEAVY ABSORB (UNDERPADS AND DIAPERS) ×5 IMPLANT
WATER STERILE IRR 1000ML POUR (IV SOLUTION) ×5 IMPLANT
WIRE BENTSON .035X145CM (WIRE) ×5 IMPLANT

## 2020-09-07 NOTE — Anesthesia Postprocedure Evaluation (Signed)
Anesthesia Post Note  Patient: Andrea Shepherd  Procedure(s) Performed: AORTOGRAM (Right Groin) INTRA OPERATIVE ARTERIOGRAM LEFT LOWER LEG (Right Groin) REDO FERMORAL ENDARTERECTOMY WITH PROFUNDAPLASTY (Left Groin) PATCH ANGIOPLASTY USING XENOSURE BIOLOGIC PATCH 1cm x 14cm (Left Groin) ULTRASOUND GUIDANCE FOR VASCULAR ACCESS FEMORAL ARTERY (Right Groin)     Patient location during evaluation: PACU Anesthesia Type: General Level of consciousness: awake and alert Pain management: pain level controlled Vital Signs Assessment: post-procedure vital signs reviewed and stable Respiratory status: spontaneous breathing, nonlabored ventilation, respiratory function stable and patient connected to nasal cannula oxygen Cardiovascular status: blood pressure returned to baseline and stable Postop Assessment: no apparent nausea or vomiting Anesthetic complications: no   No complications documented.  Last Vitals:  Vitals:   08/28/2020 2240 08/24/2020 2245  BP:    Pulse: (!) 54 (!) 54  Resp: 15 15  Temp:    SpO2: 96% 96%    Last Pain:  Vitals:   08/22/2020 2215  TempSrc:   PainSc: (P) 0-No pain                 Catalina Gravel

## 2020-09-07 NOTE — Op Note (Signed)
Date: Sep 07, 2020  Preoperative diagnosis: Acute on chronic ischemia of left lower extremity  Postoperative diagnosis: Same  Procedure: 1.  Ultrasound-guided access of right common femoral artery 2.  Aortogram including catheter selection of aorta 3.  Left lower extremity arteriogram with selection of second-order branches 4.  Redo exposure of left common femoral artery greater than 30 days 5.  Thrombectomy of left distal external iliac stent 6.  Redo left common femoral endarterectomy with profundoplasty and bovine pericardial patch angioplasty  Surgeon: Dr. Marty Heck, MD  Assistant: Risa Grill, PA  Indications: Patient is an 86 year old female with significant comorbidities including peripheral arterial disease who was admitted on Wednesday after a fall and underwent repair of her left hip fracture yesterday with orthopedic surgery.  Vascular was consulted today due to noted mottling of the left lower extremity.  She has a history of bilateral common iliac artery stents and bilateral common femoral endarterectomies.  She has had no follow-up with her vascular surgeon since 2018.  Findings: Initially accessed the right common femoral artery that was severely diseased and aortogram showed severely diseased infrarenal aorta and on the side of interest which is the left side her distal external iliac stent appeared to have a high-grade near subtotal occlusion.  The left common femoral artery was severely diseased with greater than 80% stenosis.  The proximal profunda was occluded and she did reconstitute a diseased profunda distally.  SFA also appeared occluded proximally.  Only runoff was a peroneal and anterior tibial that was poorly visualized and did not make it to the ankle.  I elected perform redo endarterectomy of the left common femoral artery including the profunda for a long segment.  Ultimately this included endarterectomy up to the distal left external iliac stent where  there was a high-grade calcified stenosis.  At completion she had a femoral pulse with a decent profunda signal.  Anesthesia: General  EBL: 600 mL  Details: Patient was taken to the operating room after informed consent was obtained.  Placed on the operative table in the supine position.  General endotracheal anesthesia was induced.  Bilateral groins and the left leg were prepped and draped in the usual sterile fashion.  Timeout was performed.  Initially evaluated the right common femoral artery with ultrasound it was patent and image was saved.  This was accessed with a micro access needle, placed a microwire, and a micro sheath.  Ultimately I got a Bentson wire into the infrarenal aorta and then placed a Omni Flush catheter.  Aortogram was obtained.  It became apparent that contrast was being hung up in the left external iliac stent.  We then used a crossover catheter and placed a Bentson wire down the left external iliac.  I could not cross this with the Bentson wire and tried to cross the external iliac lesion in the distal stent with a Glidewire advantage.  This also would not cross.  I went ahead and got left lower extremity runoff and all she had was profunda runoff (that was occluded proximally) with reconstitution of an anterior tibial and peroneal in the calf but this did not make it to the foot.  After evaluating images I felt her only option was a redo left common femoral endarterectomy.  We left a 5 French sheath in the right groin and then made a longitudinal incision in the left groin at her previous incision.  Dissected down and got the SFA profunda as well as the common femoral and distal  external iliac out and all branches controlled.  I was able to get Vesseloops on the SFA and profunda.  The profunda was dissected out for a long segment of approximately 8 cm and was still heavily calcified even distally out past the branch vessels.  That point time patient was given 100 units per kilogram  IV heparin.  Left common femoral artery was then opened and was heavily diseased with some chronic appearing thrombus.  I had at no inflow and so I continued to the open the arteriotomy up onto the distal external iliac until I encountered the external iliac stent that had significant disease in the distal portion of the stent and once this was endarterectomized we had brisk pulsatile inflow.  I then used a #4 Fogarty for balloon control.  We then performed endarterectomy of the common femoral artery down to the profunda.  The proximal profunda appeared occluded so I did open the profunda for a fairly long distance down the branch vessels and we were able to endarterectomized a large calcified plaque from the profunda.  We had backbleeding.  Bovine pericardial patch was brought in the field and sewn in place with a 5-0 Prolene parachute technique and we de-aired everything prior to completion and removed the Fogarty balloon for proximal control in the iliac artery.  Protamine was given for reversal after we checked the profunda and had Doppler flow although there was a semiocclusive signal.  Surgicel snow was used for hemostasis and the groin was washed out and closed in multiple layers of 2-0 Vicryl, 3-0 Vicryl 4-0 Monocryl Dermabond.  The right groin sheath was pulled and manual pressure held for 20 minutes.  Complication: None  Condition: Stable  Marty Heck, MD [facility] Office: [place]

## 2020-09-07 NOTE — Anesthesia Procedure Notes (Signed)
Procedure Name: Intubation Date/Time: 08/16/2020 6:39 PM Performed by: Suzy Bouchard, CRNA Pre-anesthesia Checklist: Emergency Drugs available, Suction available, Patient being monitored, Timeout performed and Patient identified Patient Re-evaluated:Patient Re-evaluated prior to induction Oxygen Delivery Method: Circle system utilized Preoxygenation: Pre-oxygenation with 100% oxygen Induction Type: IV induction and Rapid sequence Laryngoscope Size: Miller and 2 Grade View: Grade I Tube type: Oral Tube size: 7.0 mm Number of attempts: 1 Airway Equipment and Method: Stylet Placement Confirmation: ETT inserted through vocal cords under direct vision,  positive ETCO2 and breath sounds checked- equal and bilateral Secured at: 23 cm Tube secured with: Tape Dental Injury: Teeth and Oropharynx as per pre-operative assessment

## 2020-09-07 NOTE — Evaluation (Signed)
Physical Therapy Evaluation Patient Details Name: Andrea Shepherd MRN: [id number] DOB: 19-Aug-1934 Today's Date: 09/05/2020   History of Present Illness  86 y.o. F admitted to [facility] on 5/25 due to a fall, resulting in sharp pain in LLE and L hand. Andrea Shepherd showed an acute fracture of distal left fifth metacarpal diaphysis and a closed L hip fx. On 5/27 pt had s/p of L Hip IM Nail. Prior medical history significant for peripheral arterial disease, hypertension, chronic kidney disease stage IV, and current smoker.  Clinical Impression  Patient required a +2 mod a stand pivot transfer to get to the chair. She was unable to take steps today. At baseline she was mobilize without a device. She required min a for bed mobility. She was hesitant to put weight through the left side. She would benefit from skilled therapy to improve ability to transfer and ambulate. She would benefit from rehab at a SNF to return to prior level of function.     Follow Up Recommendations SNF    Equipment Recommendations  None recommended by PT    Recommendations for Other Services Rehab consult     Precautions / Restrictions Precautions Precautions: Posterior Hip;Fall Required Braces or Orthoses: Splint/Cast Splint/Cast: Ace bandage wrapping around L Wrist and splinting distal 2 fingers togther for support. Restrictions Weight Bearing Restrictions: Yes LLE Weight Bearing: Weight bearing as tolerated      Mobility  Bed Mobility Overal bed mobility: Needs Assistance Bed Mobility: Supine to Sit       Sit to supine: Mod assist   General bed mobility comments: Mod a to sit up at the edge of the bed. Patient hesitant to weight bear on right hip. Mild syncope noted.    Transfers Overall transfer level: Needs assistance Equipment used: Rolling walker (2 wheeled) Transfers: Sit to/from [facility] Sit to Stand: Mod assist;+2 physical assistance;+2 safety/equipment Stand pivot transfers: Mod assist;+2  physical assistance;+2 safety/equipment       General transfer comment: Mod a to stand and pivot to a chair. Patient unable to take steps. Patient tried x2 to stand with RW but patiet was unable to. She reported numbness in her left foot. She reports circulation issues at baseline.  Ambulation/Gait                Stairs            Wheelchair Mobility    Modified Rankin (Stroke Patients Only)       Balance Overall balance assessment: Needs assistance Sitting-balance support: Bilateral upper extremity supported;Feet supported Sitting balance-Leahy Scale: Poor Sitting balance - Comments: Pt fell backwards on bed after sitting there for 4 mins.   Standing balance support: Bilateral upper extremity supported Standing balance-Leahy Scale: Poor Standing balance comment: Pt LoB multiple times trasnferring to bed with platform walker                             Pertinent Vitals/Pain Pain Assessment: 0-10 Pain Score: 7  Pain Location: L knee, LLE, L hand Pain Descriptors / Indicators: Aching;Discomfort;Grimacing;Guarding;Numbness Pain Intervention(s): Limited activity within patient's tolerance;Monitored during session;Repositioned    Home Living Family/patient expects to be discharged to:: Private residence Living Arrangements: Spouse/significant other Available Help at Discharge: Family;Available 24 hours/day Type of Home: House Home Access: Stairs to enter Entrance Stairs-Rails: Psychiatric nurse of Steps: 6 steps Home Layout: Two level Home Equipment: None Additional Comments: Pt states there is a bedroom and bathroom on  main level, however she can't use them. She would not elaborate futher    Prior Function Level of Independence: Independent         Comments: Pt reported using no DME, recently reported that she has been showering less due to her legs giving out.     Hand Dominance   Dominant Hand: Right    Extremity/Trunk  Assessment   Upper Extremity Assessment Upper Extremity Assessment: Defer to OT evaluation LUE Deficits / Details: Acute fracture of distal L 5th metacarpal. LUE Sensation: WNL LUE Coordination: decreased fine motor;decreased gross motor    Lower Extremity Assessment Lower Extremity Assessment: Defer to PT evaluation    Cervical / Trunk Assessment Cervical / Trunk Assessment: Normal  Communication   Communication: No difficulties  Cognition Arousal/Alertness: Awake/alert Behavior During Therapy: WFL for tasks assessed/performed Overall Cognitive Status: Within Functional Limits for tasks assessed                                        General Comments General comments (skin integrity, edema, etc.): VSS on 1L    Exercises     Assessment/Plan    PT Assessment    PT Problem List         PT Treatment Interventions      PT Goals (Current goals can be found in the Care Plan section)  Acute Rehab PT Goals Patient Stated Goal: To go home PT Goal Formulation: With patient Time For Goal Achievement: 09/21/20 Potential to Achieve Goals: Good    Frequency Min 3X/week   Barriers to discharge        Co-evaluation               AM-PAC PT "6 Clicks" Mobility  Outcome Measure Help needed turning from your back to your side while in a flat bed without using bedrails?: A Lot Help needed moving from lying on your back to sitting on the side of a flat bed without using bedrails?: A Lot Help needed moving to and from a bed to a chair (including a wheelchair)?: A Lot Help needed standing up from a chair using your arms (e.g., wheelchair or bedside chair)?: A Lot Help needed to walk in hospital room?: Total Help needed climbing 3-5 steps with a railing? : Total 6 Click Score: 10    End of Session Equipment Utilized During Treatment: Gait belt Activity Tolerance: Patient tolerated treatment well Patient left: in bed Nurse Communication: Mobility  status PT Visit Diagnosis: Unsteadiness on feet (R26.81)    Time: 4097-3532 PT Time Calculation (min) (ACUTE ONLY): 27 min   Charges:   PT Evaluation $PT Eval Moderate Complexity: 1 Mod            Carney Living  PT DPT  08/31/2020, 1:38 PM

## 2020-09-07 NOTE — Progress Notes (Signed)
Patient ID: Andrea Shepherd, female   DOB: December 18, 1934, 86 y.o.   MRN: [id number] Nursing personnel are concerned about left leg, it is cool and is showing mottling of the skin. At bedside doppler is not able to obtain a good signal or pulse, faint DP at best. The left foot changed from mottled to nearly same color at the opposite foot while observing and applying hands on the left foot and leg. The left leg and foot are noticeably cooler than the right side.   Previous history of bilateral common femoral artery stents. Vascular consult obtained.

## 2020-09-07 NOTE — Progress Notes (Signed)
PROGRESS NOTE    Andrea Shepherd  WLN:[id number] DOB: [birth year] DOA: 08/30/2020 PCP: Rochel Brome, MD   Brief Narrative: Andrea Shepherd is a 86 y.o. female with a history of PAD, hypertension, CKD stage IV, tobacco use, breast cancer status postmastectomy/radiation.  Patient presented secondary to a fall after getting up out of her and stubbing her toe on a chair.  She is found to have a left hip fracture.  Orthopedic surgery was consulted on admission. ORIF performed on 5/27.   Assessment & Plan:   Principal Problem:   Closed left hip fracture (HCC) Active Problems:   CKD (chronic kidney disease), stage IV (HCC)   Peripheral arterial occlusive disease (HCC)   Hypertensive urgency   Hypokalemia   Normocytic anemia   Closed fracture of 5th metacarpal   Pleural effusion, bilateral   Left hip fracture Orthopedic surgery consulted. S/p ORIF on 5/27. -Orthopedic surgery recommendations: pending today  Left 5th metacarpal fracture Secondary to fall. Ulnar gutter splint placed in the ED -Orthopedic surgery recommendations: continue splint  Hypertensive urgency Patient is on diltiazem as an outpatient.  Blood pressure continues to be elevated -Continue Cardizem CD 120 mg daily -Continue hydralazine 50 mg TID and titrate up for control  Bilateral pleural effusion Unknown etiology.  Patient with recent CT scan with small volume effusions at that time contributed to underlying COPD.  Concerning with patient's history of breast cancer in the past.  Patient is mildly symptomatic with mild cough and orthopnea. Cardiomegaly on imaging with an elevated BNP of 702. -Transthoracic Echocardiogram pending -Consider thoracentesis if Transthoracic Echocardiogram is unremarkable  CKD stage IV Baseline creatinine of about 2.6.  Creatinine is currently stable  PAD Claudication Patient has a history of femoral bypass bilaterally with right femoral artery stent placed.  Patient is on Plavix as an  outpatient.  She is also on simvastatin. -Continue simvastatin -Holding Plavix secondary to anticipated surgery; resume pending orthopedic surgery recommendations  Acute blood loss anemia Chronic anemia In setting of kidney diease. Acute drop likely secondary to perioperative blood loss. -Trend CBC -Transfuse for hemoglobin <7  Abdominal bruit With history of poorly controlled hypertension, recurrent hypertensive urgency, kidney disease, it is possible patient has a degree of renal artery stenosis. She has had vascular procedures performed in the past. Will avoid ACEi for treatment of blood pressure. -Will attempt medical management -Will consider renal duplex US if patient could benefit from surgical management should she have RAS   DVT prophylaxis: SCDs Code Status:   Code Status: Full Code Family Communication: None at bedside Disposition Plan: Discharge likely in 3-5 days pending orthopedic surgery management and eventual PT/OT recommendations   Consultants:   Orthopedic surgery  Procedures:   None  Antimicrobials:  None    Subjective: Numbness of left leg after surgery.  Objective: Vitals:   08/31/2020 1700 09/04/2020 2059 08/31/2020 0423 08/14/2020 0824  BP: (!) 173/52 (!) 161/58 (!) 161/46 (!) 182/43  Pulse: 73 77 77 77  Resp: 18 17 18 17   Temp: 98 F (36.7 C) 98.4 F (36.9 C) 98.2 F (36.8 C) 98 F (36.7 C)  TempSrc: Oral Oral Oral Oral  SpO2: 94% 96% 96% 96%  Weight:      Height:        Intake/Output Summary (Last 24 hours) at 09/09/2020 1210 Last data filed at 08/17/2020 0600 Gross per 24 hour  Intake 532.5 ml  Output 250 ml  Net 282.5 ml   Filed Weights   08/25/2020 2208  Weight: 45.8 kg    Examination:  General exam: Appears calm and comfortable  Respiratory system: Clear to auscultation. Respiratory effort normal. Cardiovascular system: S1 & S2 heard, RRR. No murmurs, rubs, gallops or clicks. Gastrointestinal system: Abdomen is nondistended,  soft and nontender. No organomegaly or masses felt. Normal bowel sounds heard. Central nervous system: Alert and oriented. Left leg with decreased sensation past the knee. Moves legs/toes weakly Musculoskeletal: No edema. No calf tenderness. Cold feet.  Skin: No cyanosis. No rashes Psychiatry: Judgement and insight appear normal. Mood & affect appropriate.     Data Reviewed: I have personally reviewed following labs and imaging studies  CBC Lab Results  Component Value Date   WBC 8.6 08/23/2020   RBC 2.12 (L) 09/01/2020   HGB 7.1 (L) 09/10/2020   HCT 21.8 (L) 08/21/2020   MCV 102.8 (H) 08/12/2020   MCH 33.5 08/19/2020   PLT 247 09/02/2020   MCHC 32.6 08/15/2020   RDW 14.9 08/27/2020   LYMPHSABS 1.0 08/31/2020   MONOABS 0.6 08/27/2020   EOSABS 0.0 09/10/2020   BASOSABS 0.0 40/98/1191     Last metabolic panel Lab Results  Component Value Date   NA 136 09/10/2020   K 4.5 08/30/2020   CL 106 08/14/2020   CO2 16 (L) 08/31/2020   BUN 49 (H) 08/14/2020   CREATININE 2.86 (H) 08/16/2020   GLUCOSE 165 (H) 08/20/2020   GFRNONAA 16 (L) 09/02/2020   CALCIUM 8.2 (L) 08/26/2020   PROT 6.5 06/21/2020   ALBUMIN 3.8 06/21/2020   LABGLOB 2.7 06/21/2020   AGRATIO 1.4 06/21/2020   BILITOT 0.3 06/21/2020   ALKPHOS 86 06/21/2020   AST 22 06/21/2020   ALT 12 06/21/2020   ANIONGAP 14 09/05/2020    CBG (last 3)  No results for input(s): GLUCAP in the last 72 hours.   GFR: Estimated Creatinine Clearance: 10.8 mL/min (A) (by C-G formula based on SCr of 2.86 mg/dL (H)).  Coagulation Profile: Recent Labs  Lab 08/18/2020 2205  INR 0.9    Recent Results (from the past 240 hour(s))  Resp Panel by RT-PCR (Flu A&B, Covid) Nasopharyngeal Swab     Status: None   Collection Time: 08/27/2020 10:15 PM   Specimen: Nasopharyngeal Swab; Nasopharyngeal(NP) swabs in vial transport medium  Result Value Ref Range Status   SARS Coronavirus 2 by RT PCR NEGATIVE NEGATIVE Final    Comment:  (NOTE) SARS-CoV-2 target nucleic acids are NOT DETECTED.  The SARS-CoV-2 RNA is generally detectable in upper respiratory specimens during the acute phase of infection. The lowest concentration of SARS-CoV-2 viral copies this assay can detect is 138 copies/mL. A negative result does not preclude SARS-Cov-2 infection and should not be used as the sole basis for treatment or other patient management decisions. A negative result may occur with  improper specimen collection/handling, submission of specimen other than nasopharyngeal swab, presence of viral mutation(s) within the areas targeted by this assay, and inadequate number of viral copies(<138 copies/mL). A negative result must be combined with clinical observations, patient history, and epidemiological information. The expected result is Negative.  Fact Sheet for Patients:  EntrepreneurPulse.com.au  Fact Sheet for Healthcare Providers:  IncredibleEmployment.be  This test is no t yet approved or cleared by the [place] FDA and  has been authorized for detection and/or diagnosis of SARS-CoV-2 by FDA under an Emergency Use Authorization (EUA). This EUA will remain  in effect (meaning this test can be used) for the duration of the COVID-19 declaration under Section 564(b)(1) of  the Act, 21 U.S.C.section 360bbb-3(b)(1), unless the authorization is terminated  or revoked sooner.       Influenza A by PCR NEGATIVE NEGATIVE Final   Influenza B by PCR NEGATIVE NEGATIVE Final    Comment: (NOTE) The Xpert Xpress SARS-CoV-2/FLU/RSV plus assay is intended as an aid in the diagnosis of influenza from Nasopharyngeal swab specimens and should not be used as a sole basis for treatment. Nasal washings and aspirates are unacceptable for Xpert Xpress SARS-CoV-2/FLU/RSV testing.  Fact Sheet for Patients: EntrepreneurPulse.com.au  Fact Sheet for Healthcare  Providers: IncredibleEmployment.be  This test is not yet approved or cleared by the [place] FDA and has been authorized for detection and/or diagnosis of SARS-CoV-2 by FDA under an Emergency Use Authorization (EUA). This EUA will remain in effect (meaning this test can be used) for the duration of the COVID-19 declaration under Section 564(b)(1) of the Act, 21 U.S.C. section 360bbb-3(b)(1), unless the authorization is terminated or revoked.  Performed at [facility], [place] [address], [place], [place] [address]   Surgical pcr screen     Status: None   Collection Time: 09/05/20  6:59 PM   Specimen: Nasal Mucosa; Nasal Swab  Result Value Ref Range Status   MRSA, PCR NEGATIVE NEGATIVE Final   Staphylococcus aureus NEGATIVE NEGATIVE Final    Comment: (NOTE) The Xpert SA Assay (FDA approved for NASAL specimens in patients 28 years of age and older), is one component of a comprehensive surveillance program. It is not intended to diagnose infection nor to guide or monitor treatment. Performed at [facility], [place] [address], [place], [place] [address]         Radiology Studies: DG C-Arm 1-60 Min  Result Date: 09/10/2020 CLINICAL DATA:  Surgery, elective. Additional history provided: Left intramedullary (IM) nail inter trochanteric. Provided fluoroscopy time 1 minutes, 54 seconds (9.63 mGy). EXAM: LEFT FEMUR 2 VIEWS; DG C-ARM 1-60 MIN COMPARISON:  Radiographs of the right hip 08/11/2020. FINDINGS: Six intraoperative fluoroscopic images of the left hip/femur are submitted. On the provided images, there are findings of interval intramedullary nail placement within the left femur. There are 2 proximal interlocking screws which traverse the femoral head/neck. A single distal interlocking screw is also present. The hardware traverses a known fracture of the proximal left femur. Persistent, although significantly improved, fracture displacement. IMPRESSION:  Six intraoperative fluoroscopic images of the left hip/femur from left femoral intramedullary nail placement, as described. Electronically Signed   By: Kellie Simmering DO   On: 08/31/2020 12:34   DG FEMUR MIN 2 VIEWS LEFT  Result Date: 08/29/2020 CLINICAL DATA:  Surgery, elective. Additional history provided: Left intramedullary (IM) nail inter trochanteric. Provided fluoroscopy time 1 minutes, 54 seconds (9.63 mGy). EXAM: LEFT FEMUR 2 VIEWS; DG C-ARM 1-60 MIN COMPARISON:  Radiographs of the right hip 08/23/2020. FINDINGS: Six intraoperative fluoroscopic images of the left hip/femur are submitted. On the provided images, there are findings of interval intramedullary nail placement within the left femur. There are 2 proximal interlocking screws which traverse the femoral head/neck. A single distal interlocking screw is also present. The hardware traverses a known fracture of the proximal left femur. Persistent, although significantly improved, fracture displacement. IMPRESSION: Six intraoperative fluoroscopic images of the left hip/femur from left femoral intramedullary nail placement, as described. Electronically Signed   By: Kellie Simmering DO   On: 08/26/2020 12:34        Scheduled Meds: . sodium chloride   Intravenous Once  . clopidogrel  75 mg Oral Daily  . diltiazem  120 mg Oral BID  . docusate sodium  100 mg Oral BID  . feeding supplement  237 mL Oral TID BM  . hydrALAZINE  50 mg Oral Q8H  . multivitamin with minerals  1 tablet Oral Daily  . simvastatin  20 mg Oral q1800   Continuous Infusions: . sodium chloride 10 mL/hr at 08/16/2020 0932  . sodium chloride 75 mL/hr at 09/02/2020 1335  . methocarbamol (ROBAXIN) IV       LOS: 2 days     Cordelia Poche, MD [facility] 09/03/2020, 12:10 PM  If 7PM-7AM, please contact night-coverage [email or web address]

## 2020-09-07 NOTE — Progress Notes (Signed)
  Echocardiogram 2D Echocardiogram has been performed.  Matilde Bash 08/11/2020, 2:19 PM

## 2020-09-07 NOTE — Anesthesia Preprocedure Evaluation (Addendum)
Anesthesia Evaluation  Patient identified by MRN, date of birth, ID band Patient awake    Airway Mallampati: II  TM Distance: >3 FB Neck ROM: Full    Dental  (+) Edentulous Upper, Edentulous Lower   Pulmonary Current Smoker and Patient abstained from smoking.,    Pulmonary exam normal breath sounds clear to auscultation       Cardiovascular hypertension, + Peripheral Vascular Disease  Normal cardiovascular exam Rhythm:Regular Rate:Normal     Neuro/Psych PSYCHIATRIC DISORDERS Anxiety    GI/Hepatic   Endo/Other    Renal/GU Renal disease     Musculoskeletal   Abdominal   Peds  Hematology  (+) Blood dyscrasia, anemia ,   Anesthesia Other Findings   Reproductive/Obstetrics                            Anesthesia Physical Anesthesia Plan  ASA: IV and emergent  Anesthesia Plan: General   Post-op Pain Management:    Induction: Intravenous  PONV Risk Score and Plan: 2 and Dexamethasone and Ondansetron  Airway Management Planned: Oral ETT  Additional Equipment: Arterial line  Intra-op Plan:   Post-operative Plan: Extubation in OR  Informed Consent: I have reviewed the patients History and Physical, chart, labs and discussed the procedure including the risks, benefits and alternatives for the proposed anesthesia with the patient or authorized representative who has indicated his/her understanding and acceptance.     Dental advisory given  Plan Discussed with: CRNA  Anesthesia Plan Comments:         Anesthesia Quick Evaluation

## 2020-09-07 NOTE — Progress Notes (Signed)
     Subjective: 1 Day Post-Op Procedure(s) (LRB): INTRAMEDULLARY (IM) NAIL INTERTROCHANTRIC (Left) Awake, alert and oriented x 4. Dressing left lateral hip, proximal thigh and distal thigh intact, no drainge, minimal swelling Hgb 7.1. She is thin and describes unexplained weight loss. Numbness left leg chronically.   Patient reports pain as moderate.    Objective:   VITALS:  Temp:  [97.3 F (36.3 C)-98.4 F (36.9 C)] 98 F (36.7 C) (05/28 0824) Pulse Rate:  [61-90] 77 (05/28 0824) Resp:  [16-18] 17 (05/28 0824) BP: (161-189)/(43-62) 182/43 (05/28 0824) SpO2:  [91 %-97 %] 96 % (05/28 0824)  Neurologically intact ABD soft Neurovascular intact Sensation intact distally Dorsiflexion/Plantar flexion intact Incision: dressing C/D/I and no drainage   LABS Recent Labs    08/16/2020 2205 09/05/20 0217 08/16/2020 0033 08/22/2020 0140  HGB 9.8* 9.3* 8.3* 7.1*  WBC 7.7 10.3 8.6 8.6  PLT 310 290 264 247   Recent Labs    08/29/2020 0033 08/23/2020 0140  NA 140 136  K 4.2 4.5  CL 109 106  CO2 21* 16*  BUN 38* 49*  CREATININE 2.72* 2.86*  GLUCOSE 100* 165*   Recent Labs    08/19/2020 2205  INR 0.9     Assessment/Plan: 1 Day Post-Op Procedure(s) (LRB): INTRAMEDULLARY (IM) NAIL INTERTROCHANTRIC (Left)Anemia due to perioperative blood loss Hgb 7.1 probably should consider transfusion For this low a value as it will impact her ability to rehabilitate and to be able to exercise effectively.  She likely need nutrition evaluation, eval for cause of weight loss.   Advance diet Up with therapy Discharge to SNF  [name] 08/19/2020, 9:15 AMPatient ID: Andrea Shepherd, female   DOB: 24-Feb-1935, 86 y.o.   MRN: [id number]

## 2020-09-07 NOTE — Consult Note (Addendum)
Hospital Consult    Reason for Consult: Ischemic left leg Referring Physician: Orthopedic surgery, Dr. Louanne Skye MRN #:  [id number]  History of Present Illness: This is a 86 y.o. female with history of hypertension, chronic kidney disease, tobacco abuse, peripheral arterial disease that vascular surgery has been consulted for an ischemic left leg.  Patient reportedly had a fall on Wednesday with a hip fracture.  She underwent hip surgery yesterday with Dr. Erlinda Hong.  She reports that she has had numbness in the foot and been unable to wiggle her toes since Wednesday at the time of her fall.  Her leg appears mottled up to the knee.  She previously was under the care of vascular surgery [facility].  In reviewing the notes it appears she has bilateral common iliac artery stents as well as bilateral common femoral endarterectomies with profundoplasty's in the past.  She has had no follow-up since 2018.  Last follow-up suggested left profunda  occluded and a high-grade stenosis in the SFA in 2018.  Past Medical History:  Diagnosis Date  . Age-related osteoporosis without current pathological fracture   . Essential (primary) hypertension   . History of malignant neoplasm of breast   . Impaired fasting glucose   . Mixed hyperlipidemia   . Occlusion and stenosis of bilateral carotid arteries     Past Surgical History:  Procedure Laterality Date  . BREAST LUMPECTOMY  2010   right lower  . CAROTID ENDARTERECTOMY     Left  . CATARACT EXTRACTION Right 2016  . FEMORAL ARTERY STENT Right    04/2015  . FEMORAL BYPASS Bilateral    Left done 06/25/2015 and right done 07/2015  . left tumor removed from neck      Allergies  Allergen Reactions  . Sulfamethoxazole-Trimethoprim Diarrhea and Other (See Comments)    UNKNOWN REACTION   . Nitrofurantoin Diarrhea and Other (See Comments)    UNKNOWN REACTION     Prior to Admission medications   Medication Sig Start Date End Date Taking?  Authorizing Provider  acetaminophen (TYLENOL) 500 MG tablet Take 1,000 mg by mouth every 6 (six) hours as needed for moderate pain or headache.   Yes [provider]  acyclovir (ZOVIRAX) 400 MG tablet TAKE ONE TABLET BY MOUTH THREE TIMES DAILY Patient taking differently: Take 400 mg by mouth See admin instructions. Tid x 7 days. Once a month, takes at the beginning of the month 08/19/20  Yes Cox, Kirsten, MD  clopidogrel (PLAVIX) 75 MG tablet Take 1 tablet (75 mg total) by mouth daily. 06/21/20  Yes Rip Harbour, NP  diltiazem (TIAZAC) 120 MG 24 hr capsule Take 1 capsule (120 mg total) by mouth 2 (two) times daily. 06/21/20  Yes Rip Harbour, NP  hydroxypropyl methylcellulose / hypromellose (ISOPTO TEARS / GONIOVISC) 2.5 % ophthalmic solution Place 1 drop into both eyes daily.   Yes [provider]  oxyCODONE-acetaminophen (PERCOCET) 5-325 MG tablet Take 1-2 tablets by mouth every 8 (eight) hours as needed for severe pain. 08/21/2020  Yes Leandrew Koyanagi, MD  simvastatin (ZOCOR) 20 MG tablet TAKE ONE TABLET BY MOUTH AT BEDTIME Patient taking differently: Take 20 mg by mouth at bedtime. 10/09/19  Yes Lillard Anes, MD  hydrochlorothiazide (MICROZIDE) 12.5 MG capsule Take 1 capsule (12.5 mg total) by mouth 2 (two) times daily. Patient not taking: No sig reported 07/22/20   Lillard Anes, MD  Vitamin D, Ergocalciferol, (DRISDOL) 1.25 MG (50000 UNIT) CAPS capsule Take  1 capsule (50,000 Units total) by mouth 2 (two) times a week. Take 1 capsule twice weekly Patient not taking: Reported on 09/05/2020 07/11/20   Rochel Brome, MD    Social History   Socioeconomic History  . Marital status: Married    Spouse name: Not on file  . Number of children: Not on file  . Years of education: Not on file  . Highest education level: Not on file  Occupational History  . Not on file  Tobacco Use  . Smoking status: Current Every Day Smoker    Types: Cigarettes  . Smokeless  tobacco: Never Used  . Tobacco comment: Patient states she smokes around 7 cigarettes daily.  Vaping Use  . Vaping Use: Never used  Substance and Sexual Activity  . Alcohol use: Not Currently  . Drug use: Never  . Sexual activity: Not on file  Other Topics Concern  . Not on file  Social History Narrative  . Not on file   Social Determinants of Health   Financial Resource Strain: Low Risk   . Difficulty of Paying Living Expenses: Not hard at all  Food Insecurity: No Food Insecurity  . Worried About Charity fundraiser in the Last Year: Never true  . Ran Out of Food in the Last Year: Never true  Transportation Needs: Not on file  Physical Activity: Not on file  Stress: Not on file  Social Connections: Not on file  Intimate Partner Violence: Not on file     Family History  Problem Relation Age of Onset  . Cancer Father   . Cancer Sister     ROS: [x]  Positive   [ ]  Negative   [ ]  All sytems reviewed and are negative  Cardiovascular: []  chest pain/pressure []  palpitations []  SOB lying flat []  DOE []  pain in legs while walking []  pain in legs at rest []  pain in legs at night []  non-healing ulcers []  hx of DVT []  swelling in legs  Pulmonary: []  productive cough []  asthma/wheezing []  home O2  Neurologic: [x]  weakness in []  arms [x]  legs - left leg [x]  numbness in []  arms [x]  legs - left leg []  hx of CVA []  mini stroke [] difficulty speaking or slurred speech []  temporary loss of vision in one eye []  dizziness  Hematologic: []  hx of cancer []  bleeding problems []  problems with blood clotting easily  Endocrine:   []  diabetes []  thyroid disease  GI []  vomiting blood []  blood in stool  GU: []  CKD/renal failure []  HD--[]  M/W/F or []  T/T/S []  burning with urination []  blood in urine  Psychiatric: []  anxiety []  depression  Musculoskeletal: []  arthritis []  joint pain  Integumentary: []  rashes []  ulcers  Constitutional: []  fever []   chills   Physical Examination  Vitals:   08/12/2020 0423 08/25/2020 0824  BP: (!) 161/46 (!) 182/43  Pulse: 77 77  Resp: 18 17  Temp: 98.2 F (36.8 C) 98 F (36.7 C)  SpO2: 96% 96%   Body mass index is 15.36 kg/m.  General:  NAD Gait: Not observed HENT: WNL, normocephalic Pulmonary: normal non-labored breathing, without Rales, rhonchi,  wheezing Cardiac: regular, without  Murmurs, rubs or gallops Abdomen: soft, NT/ND Vascular Exam/Pulses: Palpable right femoral pulse Left femoral pulse weak but palpable No signals in the left foot Dense deficit on exam unable to wiggle toes and no significant sensation Extremities: with ischemic changes left foot Musculoskeletal: no muscle wasting or atrophy  Neurologic: Appears neurologically intact except for the  left foot as documented above      CBC    Component Value Date/Time   WBC 8.6 08/28/2020 0140   RBC 2.12 (L) 09/02/2020 0140   HGB 7.1 (L) 08/26/2020 1449   HGB 11.9 06/21/2020 1024   HCT 21.8 (L) 08/11/2020 1449   HCT 32.9 (L) 06/21/2020 1024   PLT 247 08/17/2020 0140   PLT 311 06/21/2020 1024   MCV 102.8 (H) 08/20/2020 0140   MCV 97 06/21/2020 1024   MCH 33.5 09/04/2020 0140   MCHC 32.6 08/30/2020 0140   RDW 14.9 08/20/2020 0140   RDW 13.5 06/21/2020 1024   LYMPHSABS 1.0 08/21/2020 2205   LYMPHSABS 1.0 06/21/2020 1024   MONOABS 0.6 08/28/2020 2205   EOSABS 0.0 08/13/2020 2205   EOSABS 0.1 06/21/2020 1024   BASOSABS 0.0 08/15/2020 2205   BASOSABS 0.0 06/21/2020 1024    BMET    Component Value Date/Time   NA 136 08/18/2020 0140   NA 142 06/21/2020 1024   K 4.5 08/28/2020 0140   CL 106 08/14/2020 0140   CO2 16 (L) 08/21/2020 0140   GLUCOSE 165 (H) 08/12/2020 0140   BUN 49 (H) 09/08/2020 0140   BUN 39 (H) 06/21/2020 1024   CREATININE 2.86 (H) 09/10/2020 0140   CALCIUM 8.2 (L) 08/23/2020 0140   GFRNONAA 16 (L) 08/24/2020 0140    COAGS: Lab Results  Component Value Date   INR 0.9 08/12/2020      Non-Invasive Vascular Imaging:    Left leg arterial duplex -monophasic flow in the common femoral but appears patent.  Monophasic flow in the SFA that is diffusely diseased and likely has some chronic total occlusions.  Monophasic flow in the pop.  No flow in the tibial vessels.   ASSESSMENT/PLAN: This is a 86 y.o. female with multiple medical comorbidities including peripheral arterial disease that vascular surgery has been consulted for ischemic left leg.  She has very advanced ischemia on exam as pictured above with mottling of the left foot up to the knee.  She has no signals in the foot.  She has a fairly dense motor deficit and is unable to wiggle her toes.  I suspect her ischemia has been ongoing since Wednesday at the time of her fall given she reports all of her foot symptoms started at that time.  We performed arterial duplex at the bedside given her chronic kidney disease and she does have monophasic flow in the common femoral but diffusely diseased SFA with some chronic total occlusions as well as monophasic flow in the popliteal and no flow in the tibials.  All of her vessels appear heavily calcified and diseased.  I think she likely has very limited options but will proceed with left leg arteriogram possible endovascular intervention and possible thrombectomy.  I discussed with her that even with intervention she is at exceedingly high risk for limb loss.  Marty Heck, MD [facility] Office: [place]

## 2020-09-07 NOTE — Transfer of Care (Signed)
Immediate Anesthesia Transfer of Care Note  Patient: Andrea Shepherd  Procedure(s) Performed: AORTOGRAM (Right Groin) INTRA OPERATIVE ARTERIOGRAM LEFT LOWER LEG (Right Groin) REDO FERMORAL ENDARTERECTOMY WITH PROFUNDAPLASTY (Left Groin) PATCH ANGIOPLASTY USING XENOSURE BIOLOGIC PATCH 1cm x 14cm (Left Groin) ULTRASOUND GUIDANCE FOR VASCULAR ACCESS FEMORAL ARTERY (Right Groin)  Patient Location: PACU  Anesthesia Type:General  Level of Consciousness: drowsy and responds to stimulation  Airway & Oxygen Therapy: Patient Spontanous Breathing and Patient connected to nasal cannula oxygen  Post-op Assessment: Report given to RN and Post -op Vital signs reviewed and stable  Post vital signs: Reviewed and stable  Last Vitals:  Vitals Value Taken Time  BP 175/56 08/19/2020 2148  Temp    Pulse 54 09/08/2020 2156  Resp 15 09/10/2020 2156  SpO2 96 % 09/09/2020 2156  Vitals shown include unvalidated device data.  Last Pain:  Vitals:   08/26/2020 1814  TempSrc:   PainSc: 0-No pain      Patients Stated Pain Goal: 1 (67/01/41 0301)  Complications: No complications documented.

## 2020-09-07 NOTE — Anesthesia Procedure Notes (Signed)
Arterial Line Insertion Start/End05/10/2020 6:50 PM, 09/02/2020 6:54 PM Performed by: Clearnce Sorrel, CRNA, CRNA  Patient location: OR. Preanesthetic checklist: patient identified, IV checked, site marked, risks and benefits discussed, surgical consent, monitors and equipment checked, pre-op evaluation, timeout performed and anesthesia consent Patient sedated Right, radial was placed Catheter size: 20 G Hand hygiene performed , maximum sterile barriers used  and Seldinger technique used Allen's test indicative of satisfactory collateral circulation Attempts: 2 Procedure performed without using ultrasound guided technique. Following insertion, dressing applied and Biopatch. Post procedure assessment: normal

## 2020-09-07 NOTE — Progress Notes (Signed)
VASCULAR LAB    Left lower extremity arterial duplex has been performed.  See CV proc for preliminary results.  Dr. Carlis Abbott present during scan  [facility], [facility], RVT 08/24/2020, 7:32 PM

## 2020-09-07 NOTE — Evaluation (Signed)
Occupational Therapy Evaluation Patient Details Name: Andrea Shepherd MRN: [id number] DOB: 13-Dec-1934 Today's Date: 09/10/2020    History of Present Illness 86 y.o. F admitted to [facility] on 5/25 due to a fall, resulting in sharp pain in LLE and L hand. [name] showed an acute fracture of distal left fifth metacarpal diaphysis and a closed L hip fx. On 5/27 pt had s/p of L Hip IM Nail. Prior medical history significant for peripheral arterial disease, hypertension, chronic kidney disease stage IV, and current smoker.   Clinical Impression   Pt admitted to ED for concerns and procedure listed above. PTA pt reported independence with ADL's and functional mobility, however she was beginning to have BLE weakness. At the time of the evaluation, pt required mod A +2 for all mobility and transfers due to weakness, numbness and pain in LLE. Additionally, pt had a loss of balance sitting EOB after sitting there for 4 mins. Pt reported that she lost her balance sitting there due to LLE numbness. OT will continue to follow up to address concerns listed below.     Follow Up Recommendations  SNF;Supervision/Assistance - 24 hour    Equipment Recommendations  3 in 1 bedside commode;Tub/shower bench;Other (comment) (RW)    Recommendations for Other Services       Precautions / Restrictions Precautions Precautions: Posterior Hip;Fall Required Braces or Orthoses: Splint/Cast Splint/Cast: Ace bandage wrapping around L Wrist and splinting distal 2 fingers togther for support. Restrictions Weight Bearing Restrictions: Yes LLE Weight Bearing: Weight bearing as tolerated      Mobility Bed Mobility Overal bed mobility: Needs Assistance Bed Mobility: Sit to Supine       Sit to supine: Mod assist;+2 for physical assistance;+2 for safety/equipment   General bed mobility comments: Mod a +2 for helicopter transfer due to weakness and pain.    Transfers Overall transfer level: Needs assistance Equipment used:  Rolling walker (2 wheeled) Transfers: Sit to/from [facility] Sit to Stand: Mod assist;+2 physical assistance;+2 safety/equipment Stand pivot transfers: Mod assist;+2 physical assistance;+2 safety/equipment       General transfer comment: Pt is a mod assist +2 due to balance concerns, numbness in LLE and overall weakness    Balance Overall balance assessment: Needs assistance Sitting-balance support: Bilateral upper extremity supported;Feet supported Sitting balance-Leahy Scale: Poor Sitting balance - Comments: Pt fell backwards on bed after sitting there for 4 mins.   Standing balance support: Bilateral upper extremity supported Standing balance-Leahy Scale: Poor Standing balance comment: Pt LoB multiple times trasnferring to bed with platform walker                           ADL either performed or assessed with clinical judgement   ADL Overall ADL's : Needs assistance/impaired Eating/Feeding: Set up;Sitting Eating/Feeding Details (indicate cue type and reason): Pt has difficulties opening containers Grooming: Wash/dry hands;Wash/dry face;Min guard;Sitting Grooming Details (indicate cue type and reason): Pt requires min guard for safety due to unsupported sitting balance. Upper Body Bathing: Minimal assistance;Sitting Upper Body Bathing Details (indicate cue type and reason): min assist due to balance deficits Lower Body Bathing: Moderate assistance;Sitting/lateral leans;Sit to/from stand;+2 for physical assistance Lower Body Bathing Details (indicate cue type and reason): Mod assist due to pt's pain level and inability to reach her feet. Mod+2 to assist pt with standing to complete pericare Upper Body Dressing : Minimal assistance;Sitting Upper Body Dressing Details (indicate cue type and reason): min assist due to balance defcitis Lower  Body Dressing: Moderate assistance;+2 for physical assistance;Sitting/lateral leans;Sit to/from stand Lower Body  Dressing Details (indicate cue type and reason): Mod assist due to pain limiting performance as well as balance defcitis and inability to reach her feet. Toilet Transfer: Moderate assistance;+2 for physical assistance;+2 for safety/equipment;Stand-pivot Toilet Transfer Details (indicate cue type and reason): Pt needs assistance remaining standing and upright, especially with pivoting, due to balance and numbness in LLE Toileting- Clothing Manipulation and Hygiene: Moderate assistance;+2 for safety/equipment;+2 for physical assistance;Sitting/lateral lean;Sit to/from stand Toileting - Clothing Manipulation Details (indicate cue type and reason): Mod + 2 assist when pt has to stand to complete pericare, pt has difficulties maintaining balance and leaning when seated as well Tub/ Shower Transfer: Moderate assistance;+2 for physical assistance;+2 for safety/equipment;Stand-pivot Tub/Shower Transfer Details (indicate cue type and reason): Pt needs assistance remaining standing and upright, especially with pivoting, due to balance and numbness in LLE Functional mobility during ADLs: Moderate assistance;+2 for physical assistance;+2 for safety/equipment;Rolling walker General ADL Comments: Pt is very unstable, unable to maintain balance with challenges in sitting and due to numbness in LLE, pt having difficulties with transfers and standing as well     Vision Baseline Vision/History: Wears glasses;Retinopathy Wears Glasses: Reading only Patient Visual Report: No change from baseline Additional Comments: Pt has visual deficits, seeing opthamologist for them     Perception Perception Perception Tested?: No   Praxis Praxis Praxis tested?: Not tested    Pertinent Vitals/Pain Pain Assessment: 0-10 Pain Score: 7  Pain Location: L knee, LLE, L hand Pain Descriptors / Indicators: Aching;Discomfort;Grimacing;Guarding;Numbness Pain Intervention(s): Limited activity within patient's  tolerance;Repositioned;Monitored during session     Hand Dominance Right   Extremity/Trunk Assessment Upper Extremity Assessment Upper Extremity Assessment: Generalized weakness;LUE deficits/detail LUE Deficits / Details: Acute fracture of distal L 5th metacarpal. LUE Sensation: WNL LUE Coordination: decreased fine motor;decreased gross motor   Lower Extremity Assessment Lower Extremity Assessment: Defer to PT evaluation   Cervical / Trunk Assessment Cervical / Trunk Assessment: Normal   Communication Communication Communication: No difficulties   Cognition Arousal/Alertness: Awake/alert Behavior During Therapy: WFL for tasks assessed/performed Overall Cognitive Status: Within Functional Limits for tasks assessed                                     General Comments  VSS on 1L    Exercises     Shoulder Instructions      Home Living Family/patient expects to be discharged to:: Private residence Living Arrangements: Spouse/significant other Available Help at Discharge: Family;Available 24 hours/day Type of Home: House Home Access: Stairs to enter [employer] of Steps: 6 steps Entrance Stairs-Rails: Right;Left Home Layout: Two level Alternate Level Stairs-Number of Steps: full flight Alternate Level Stairs-Rails: Left Bathroom Shower/Tub: Teacher, early years/pre: Standard     Home Equipment: None   Additional Comments: Pt states there is a bedroom and bathroom on main level, however she can't use them. She would not elaborate futher      Prior Functioning/Environment Level of Independence: Independent        Comments: Pt reported using no DME, recently reported that she has been showering less due to her legs giving out.        OT Problem List: Decreased strength;Decreased activity tolerance;Impaired balance (sitting and/or standing);Impaired vision/perception;Decreased coordination;Decreased safety awareness;Decreased  knowledge of use of DME or AE;Decreased knowledge of precautions;Pain      OT Treatment/Interventions:  Self-care/ADL training;Therapeutic exercise;Energy conservation;DME and/or AE instruction;Therapeutic activities;Visual/perceptual remediation/compensation;Patient/family education;Balance training    OT Goals(Current goals can be found in the care plan section) Acute Rehab OT Goals Patient Stated Goal: To go home OT Goal Formulation: With patient Time For Goal Achievement: 09/21/20 Potential to Achieve Goals: Good ADL Goals Pt Will Perform Lower Body Dressing: with set-up;with adaptive equipment;sit to/from stand;sitting/lateral leans Pt Will Transfer to Toilet: with modified independence;stand pivot transfer;bedside commode Pt Will Perform Toileting - Clothing Manipulation and hygiene: with supervision;sitting/lateral leans;sit to/from stand Additional ADL Goal #1: Pt will tolerate sitting EOB with no support for 5 mins to complete seated ADL's Additional ADL Goal #2: Pt will verbalize 3 fall prevention techniques  OT Frequency: Min 2X/week   Barriers to D/C:            Co-evaluation              AM-PAC OT "6 Clicks" Daily Activity     Outcome Measure Help from another person eating meals?: A Little Help from another person taking care of personal grooming?: A Little Help from another person toileting, which includes using toliet, bedpan, or urinal?: A Lot Help from another person bathing (including washing, rinsing, drying)?: A Lot Help from another person to put on and taking off regular upper body clothing?: A Little Help from another person to put on and taking off regular lower body clothing?: A Lot 6 Click Score: 15   End of Session Equipment Utilized During Treatment: Gait belt;Rolling walker;Oxygen Nurse Communication: Mobility status  Activity Tolerance: Patient limited by pain Patient left: in bed;with call bell/phone within reach;with bed alarm set  OT  Visit Diagnosis: Unsteadiness on feet (R26.81);Muscle weakness (generalized) (M62.81);History of falling (Z91.81)                Time: 1120-1150 OT Time Calculation (min): 30 min Charges:  OT General Charges $OT Visit: 1 Visit OT Evaluation $OT Eval Moderate Complexity: 1 Mod OT Treatments $Self Care/Home Management : 8-22 mins  Akisha Sturgill H., OTR/L Acute Rehabilitation  Artin Mceuen Elane Yolanda Bonine 08/25/2020, 12:15 PM

## 2020-09-08 ENCOUNTER — Inpatient Hospital Stay (HOSPITAL_COMMUNITY): Payer: Medicare Other

## 2020-09-08 DIAGNOSIS — I160 Hypertensive urgency: Secondary | ICD-10-CM | POA: Diagnosis not present

## 2020-09-08 DIAGNOSIS — N184 Chronic kidney disease, stage 4 (severe): Secondary | ICD-10-CM | POA: Diagnosis not present

## 2020-09-08 DIAGNOSIS — E876 Hypokalemia: Secondary | ICD-10-CM | POA: Diagnosis not present

## 2020-09-08 DIAGNOSIS — S72002A Fracture of unspecified part of neck of left femur, initial encounter for closed fracture: Secondary | ICD-10-CM | POA: Diagnosis not present

## 2020-09-08 LAB — TYPE AND SCREEN
ABO/RH(D): A POS
Antibody Screen: NEGATIVE
Unit division: 0
Unit division: 0
Unit division: 0
Unit division: 0

## 2020-09-08 LAB — BPAM RBC
Blood Product Expiration Date: 202206062359
Blood Product Expiration Date: 202206202359
Blood Product Expiration Date: 202206242359
Blood Product Expiration Date: 202206242359
ISSUE DATE / TIME: 202205281757
ISSUE DATE / TIME: 202205282015
ISSUE DATE / TIME: 202205282018
ISSUE DATE / TIME: 202205282018
Unit Type and Rh: 6200
Unit Type and Rh: 6200
Unit Type and Rh: 6200
Unit Type and Rh: 6200

## 2020-09-08 LAB — CBC
HCT: 38.5 % (ref 36.0–46.0)
Hemoglobin: 13.1 g/dL (ref 12.0–15.0)
MCH: 31 pg (ref 26.0–34.0)
MCHC: 34 g/dL (ref 30.0–36.0)
MCV: 91.2 fL (ref 80.0–100.0)
Platelets: 171 10*3/uL (ref 150–400)
RBC: 4.22 MIL/uL (ref 3.87–5.11)
RDW: 17.3 % — ABNORMAL HIGH (ref 11.5–15.5)
WBC: 7.6 10*3/uL (ref 4.0–10.5)
nRBC: 0 % (ref 0.0–0.2)

## 2020-09-08 LAB — LIPID PANEL
Cholesterol: 139 mg/dL (ref 0–200)
HDL: 73 mg/dL (ref >40)
LDL Cholesterol: 47 mg/dL (ref 0–99)
Total CHOL/HDL Ratio: 1.9 RATIO
Triglycerides: 97 mg/dL (ref <150)
VLDL: 19 mg/dL (ref 0–40)

## 2020-09-08 LAB — BODY FLUID CELL COUNT WITH DIFFERENTIAL
Lymphs, Fluid: 31 %
Monocyte-Macrophage-Serous Fluid: 17 % — ABNORMAL LOW (ref 50–90)
Neutrophil Count, Fluid: 52 % — ABNORMAL HIGH (ref 0–25)
Total Nucleated Cell Count, Fluid: 22 cu mm (ref 0–1000)

## 2020-09-08 LAB — BASIC METABOLIC PANEL
Anion gap: 11 (ref 5–15)
BUN: 54 mg/dL — ABNORMAL HIGH (ref 8–23)
CO2: 17 mmol/L — ABNORMAL LOW (ref 22–32)
Calcium: 8 mg/dL — ABNORMAL LOW (ref 8.9–10.3)
Chloride: 109 mmol/L (ref 98–111)
Creatinine, Ser: 3.08 mg/dL — ABNORMAL HIGH (ref 0.44–1.00)
GFR, Estimated: 14 mL/min — ABNORMAL LOW (ref >60)
Glucose, Bld: 120 mg/dL — ABNORMAL HIGH (ref 70–99)
Potassium: 5.1 mmol/L (ref 3.5–5.1)
Sodium: 137 mmol/L (ref 135–145)

## 2020-09-08 LAB — PROTEIN, TOTAL: Total Protein: 3.5 g/dL — ABNORMAL LOW (ref 6.5–8.1)

## 2020-09-08 LAB — LACTATE DEHYDROGENASE: LDH: 197 U/L — ABNORMAL HIGH (ref 98–192)

## 2020-09-08 MED ORDER — GUAIFENESIN-DM 100-10 MG/5ML PO SYRP: 15.0000 mL | ORAL_SOLUTION | ORAL | Status: DC | PRN

## 2020-09-08 MED ORDER — CHLORHEXIDINE GLUCONATE CLOTH 2 % EX PADS
6.0000 | MEDICATED_PAD | Freq: Every day | CUTANEOUS | Status: DC
  Administered 2020-09-08 – 2020-09-12 (×5): 6 via TOPICAL

## 2020-09-08 MED ORDER — POTASSIUM CHLORIDE CRYS ER 20 MEQ PO TBCR: 20.0000 meq | EXTENDED_RELEASE_TABLET | Freq: Every day | ORAL | Status: DC | PRN

## 2020-09-08 MED ORDER — METOPROLOL TARTRATE 5 MG/5ML IV SOLN: 2.0000 mg | INTRAVENOUS | Status: DC | PRN

## 2020-09-08 MED ORDER — SODIUM CHLORIDE 0.9 % IV SOLN: 500.0000 mL | Freq: Once | INTRAVENOUS | Status: DC | PRN

## 2020-09-08 MED ORDER — LABETALOL HCL 5 MG/ML IV SOLN
10.0000 mg | INTRAVENOUS | Status: AC | PRN
  Administered 2020-09-08 – 2020-09-10 (×4): 10 mg via INTRAVENOUS
  Filled 2020-09-08 (×3): qty 4

## 2020-09-08 MED ORDER — LIDOCAINE HCL (PF) 1 % IJ SOLN
INTRAMUSCULAR | Status: AC
  Filled 2020-09-08: qty 30

## 2020-09-08 MED ORDER — CEFAZOLIN SODIUM-DEXTROSE 2-4 GM/100ML-% IV SOLN
2.0000 g | Freq: Once | INTRAVENOUS | Status: AC
  Administered 2020-09-08: 2 g via INTRAVENOUS
  Filled 2020-09-08: qty 100

## 2020-09-08 MED ORDER — POLYETHYLENE GLYCOL 3350 17 G PO PACK
17.0000 g | PACK | Freq: Every day | ORAL | Status: DC
  Administered 2020-09-08 – 2020-09-11 (×3): 17 g via ORAL
  Filled 2020-09-08 (×4): qty 1

## 2020-09-08 NOTE — Progress Notes (Signed)
PROGRESS NOTE    Andrea Shepherd  UUV:[id number] DOB: 1934/04/24 DOA: 09/09/2020 PCP: Rochel Brome, MD   Brief Narrative: Andrea Shepherd is a 86 y.o. female with a history of PAD, hypertension, CKD stage IV, tobacco use, breast cancer status postmastectomy/radiation.  Patient presented secondary to a fall after getting up out of her and stubbing her toe on a chair.  She is found to have a left hip fracture.  Orthopedic surgery was consulted on admission. ORIF performed on 5/27.   Assessment & Plan:   Principal Problem:   Closed left hip fracture (HCC) Active Problems:   CKD (chronic kidney disease), stage IV (HCC)   Peripheral arterial occlusive disease (HCC)   Hypertensive urgency   Hypokalemia   Normocytic anemia   Closed fracture of 5th metacarpal   Pleural effusion, bilateral   Status post surgery   Left hip fracture Orthopedic surgery consulted. S/p ORIF on 5/27. -Orthopedic surgery recommendations: advance diet, up with therapy  Left 5th metacarpal fracture Secondary to fall. Ulnar gutter splint placed in the ED -Orthopedic surgery recommendations: continue splint  Hypertensive urgency Patient is on diltiazem as an outpatient.  Blood pressure continues to be elevated with significantly high pulse pressure. -Continue Cardizem CD 120 mg daily -Increase to hydralazine 75 mg TID and titrate up for control  Bilateral pleural effusion Unknown etiology.  Patient with recent CT scan with small volume effusions at that time contributed to underlying COPD.  Concerning with patient's history of breast cancer in the past.  Patient is mildly symptomatic with mild cough and orthopnea. Cardiomegaly on imaging with an elevated BNP of 702. Transthoracic Echocardiogram significant for a normal EF of 60-65% with grade 1 diastolic dysfunction and small pericardial effusion. -US thoracentesis  CKD stage IV Baseline creatinine of about 2.6.  Creatinine is currently  stable  PAD Claudication Patient has a history of femoral bypass bilaterally with right femoral artery stent placed.  Patient is on Plavix as an outpatient.  She is also on simvastatin. Patient developed evidence if acutely ischemic limb of her left leg on 5/28. Vascular surgery was consulted and urgent aortogram was performed on 5/28; thrombectomy of left distal external iliac stent/left common femoral endarterectomy performed. -Continue simvastatin -Plavix restarted  Acute blood loss anemia Chronic anemia In setting of kidney diease. Acute drop likely secondary to perioperative blood loss. Patient received 4 units of PRBC periop on 5/28. Post transfusion hemoglobin of 13.1.  -Trend CBC -Transfuse for hemoglobin <7  Abdominal bruit With history of poorly controlled hypertension, recurrent hypertensive urgency, kidney disease, it is possible patient has a degree of renal artery stenosis. She has had vascular procedures performed in the past. Will avoid ACEi for treatment of blood pressure. -Will attempt medical management -Will consider renal duplex US if patient could benefit from surgical management should she have RAS   DVT prophylaxis: SCDs Code Status:   Code Status: Full Code Family Communication: None at bedside Disposition Plan: Discharge likely in 3-5 days pending orthopedic surgery management, vascular surgery recommendations and eventual PT/OT recommendations   Consultants:   Orthopedic surgery  Vascular surgery  Procedures:   INTRAMEDULLARY NAIL OF LEFT HIP (08/18/2020)  AORTOGRAM (08/13/2020) 1.  Ultrasound-guided access of right common femoral artery 2.  Aortogram including catheter selection of aorta 3.  Left lower extremity arteriogram with selection of second-order branches 4.  Redo exposure of left common femoral artery greater than 30 days 5.  Thrombectomy of left distal external iliac stent 6.  Redo left  common femoral endarterectomy with profundoplasty and  bovine pericardial patch angioplasty  Antimicrobials:  None    Subjective: Continued numbness of left leg below her knee. No other concerns.  Objective: Vitals:   08/18/2020 2335 08/19/2020 2340 09/08/20 0130 09/08/20 0800  BP: (!) 173/53  (!) 159/51 (!) 185/48  Pulse: (!) 57 (!) 56 (!) 59 70  Resp: 15  12 14   Temp:   98.2 F (36.8 C) 98.4 F (36.9 C)  TempSrc:   Oral Oral  SpO2: 93% 96% 96% 96%  Weight:      Height:        Intake/Output Summary (Last 24 hours) at 09/08/2020 0846 Last data filed at 09/08/2020 0549 Gross per 24 hour  Intake 2460 ml  Output 1050 ml  Net 1410 ml   Filed Weights   08/20/2020 2208 08/23/2020 1814  Weight: 45.8 kg 45.8 kg    Examination:  General exam: Appears calm and comfortable Respiratory system: Clear to auscultation. Respiratory effort normal. Cardiovascular system: S1 & S2 heard, RRR. No murmurs, rubs, gallops or clicks. Gastrointestinal system: Abdomen is nondistended, soft and nontender. No organomegaly or masses felt. Normal bowel sounds heard. Central nervous system: Alert and oriented. No focal neurological deficits. Musculoskeletal: No edema. No calf tenderness. Cool left foot but color of leg looks better Skin: No cyanosis. No rashes Psychiatry: Judgement and insight appear normal. Mood & affect appropriate.    Data Reviewed: I have personally reviewed following labs and imaging studies  CBC Lab Results  Component Value Date   WBC 7.6 09/08/2020   RBC 4.22 09/08/2020   HGB 13.1 09/08/2020   HCT 38.5 09/08/2020   MCV 91.2 09/08/2020   MCH 31.0 09/08/2020   PLT 171 09/08/2020   MCHC 34.0 09/08/2020   RDW 17.3 (H) 09/08/2020   LYMPHSABS 1.0 09/05/2020   MONOABS 0.6 08/14/2020   EOSABS 0.0 08/27/2020   BASOSABS 0.0 94/49/6759     Last metabolic panel Lab Results  Component Value Date   NA 137 09/08/2020   K 5.1 09/08/2020   CL 109 09/08/2020   CO2 17 (L) 09/08/2020   BUN 54 (H) 09/08/2020   CREATININE 3.08 (H)  09/08/2020   GLUCOSE 120 (H) 09/08/2020   GFRNONAA 14 (L) 09/08/2020   CALCIUM 8.0 (L) 09/08/2020   PROT 6.5 06/21/2020   ALBUMIN 3.8 06/21/2020   LABGLOB 2.7 06/21/2020   AGRATIO 1.4 06/21/2020   BILITOT 0.3 06/21/2020   ALKPHOS 86 06/21/2020   AST 22 06/21/2020   ALT 12 06/21/2020   ANIONGAP 11 09/08/2020    CBG (last 3)  No results for input(s): GLUCAP in the last 72 hours.   GFR: Estimated Creatinine Clearance: 10 mL/min (A) (by C-G formula based on SCr of 3.08 mg/dL (H)).  Coagulation Profile: Recent Labs  Lab 08/13/2020 2205  INR 0.9    Recent Results (from the past 240 hour(s))  Resp Panel by RT-PCR (Flu A&B, Covid) Nasopharyngeal Swab     Status: None   Collection Time: 08/25/2020 10:15 PM   Specimen: Nasopharyngeal Swab; Nasopharyngeal(NP) swabs in vial transport medium  Result Value Ref Range Status   SARS Coronavirus 2 by RT PCR NEGATIVE NEGATIVE Final    Comment: (NOTE) SARS-CoV-2 target nucleic acids are NOT DETECTED.  The SARS-CoV-2 RNA is generally detectable in upper respiratory specimens during the acute phase of infection. The lowest concentration of SARS-CoV-2 viral copies this assay can detect is 138 copies/mL. A negative result does not preclude SARS-Cov-2 infection and  should not be used as the sole basis for treatment or other patient management decisions. A negative result may occur with  improper specimen collection/handling, submission of specimen other than nasopharyngeal swab, presence of viral mutation(s) within the areas targeted by this assay, and inadequate number of viral copies(<138 copies/mL). A negative result must be combined with clinical observations, patient history, and epidemiological information. The expected result is Negative.  Fact Sheet for Patients:  EntrepreneurPulse.com.au  Fact Sheet for Healthcare Providers:  IncredibleEmployment.be  This test is no t yet approved or cleared by  the [place] FDA and  has been authorized for detection and/or diagnosis of SARS-CoV-2 by FDA under an Emergency Use Authorization (EUA). This EUA will remain  in effect (meaning this test can be used) for the duration of the COVID-19 declaration under Section 564(b)(1) of the Act, 21 U.S.C.section 360bbb-3(b)(1), unless the authorization is terminated  or revoked sooner.       Influenza A by PCR NEGATIVE NEGATIVE Final   Influenza B by PCR NEGATIVE NEGATIVE Final    Comment: (NOTE) The Xpert Xpress SARS-CoV-2/FLU/RSV plus assay is intended as an aid in the diagnosis of influenza from Nasopharyngeal swab specimens and should not be used as a sole basis for treatment. Nasal washings and aspirates are unacceptable for Xpert Xpress SARS-CoV-2/FLU/RSV testing.  Fact Sheet for Patients: EntrepreneurPulse.com.au  Fact Sheet for Healthcare Providers: IncredibleEmployment.be  This test is not yet approved or cleared by the [place] FDA and has been authorized for detection and/or diagnosis of SARS-CoV-2 by FDA under an Emergency Use Authorization (EUA). This EUA will remain in effect (meaning this test can be used) for the duration of the COVID-19 declaration under Section 564(b)(1) of the Act, 21 U.S.C. section 360bbb-3(b)(1), unless the authorization is terminated or revoked.  Performed at [facility], [place] [address] [place], [place] [address]   Surgical pcr screen     Status: None   Collection Time: 09/05/20  6:59 PM   Specimen: Nasal Mucosa; Nasal Swab  Result Value Ref Range Status   MRSA, PCR NEGATIVE NEGATIVE Final   Staphylococcus aureus NEGATIVE NEGATIVE Final    Comment: (NOTE) The Xpert SA Assay (FDA approved for NASAL specimens in patients 35 years of age and older), is one component of a comprehensive surveillance program. It is not intended to diagnose infection nor to guide or monitor treatment. Performed at  [facility], [place] [address], [place], [place] [address]         Radiology Studies: DG C-Arm 1-60 Min  Result Date: 08/16/2020 CLINICAL DATA:  Surgery, elective. Additional history provided: Left intramedullary (IM) nail inter trochanteric. Provided fluoroscopy time 1 minutes, 54 seconds (9.63 mGy). EXAM: LEFT FEMUR 2 VIEWS; DG C-ARM 1-60 MIN COMPARISON:  Radiographs of the right hip 08/20/2020. FINDINGS: Six intraoperative fluoroscopic images of the left hip/femur are submitted. On the provided images, there are findings of interval intramedullary nail placement within the left femur. There are 2 proximal interlocking screws which traverse the femoral head/neck. A single distal interlocking screw is also present. The hardware traverses a known fracture of the proximal left femur. Persistent, although significantly improved, fracture displacement. IMPRESSION: Six intraoperative fluoroscopic images of the left hip/femur from left femoral intramedullary nail placement, as described. Electronically Signed   By: Kellie Simmering DO   On: 08/19/2020 12:34   ECHOCARDIOGRAM COMPLETE  Result Date: 08/13/2020    ECHOCARDIOGRAM REPORT   Patient Name:   Andrea Shepherd Date of Exam:  09/09/2020 Medical Rec #:  [id number]   Height:       68.0 in Accession #:    [id number]  Weight:       101.0 lb Date of Birth:  12-20-1934    BSA:          1.529 m Patient Age:    22 years    BP:           182/43 mmHg Patient Gender: F           HR:           77 bpm. Exam Location:  Inpatient Procedure: 2D Echo, Cardiac Doppler and Color Doppler Indications:    Cardiomegaly  History:        Patient has no prior history of Echocardiogram examinations. PAD                 and COPD; Risk Factors:Hypertension, Dyslipidemia and Current                 Smoker. Left hip fracture, pleural effusion.  Sonographer:    Dustin Flock Referring Phys: [phone] [place]  1. Left ventricular ejection fraction, by estimation, is 60 to  65%. The left ventricle has normal function. The left ventricle has no regional wall motion abnormalities. There is mild left ventricular hypertrophy. Left ventricular diastolic parameters are consistent with Grade I diastolic dysfunction (impaired relaxation). Elevated left atrial pressure.  2. Right ventricular systolic function is normal. The right ventricular size is normal. There is moderately elevated pulmonary artery systolic pressure.  3. Left atrial size was mildly dilated.  4. A small pericardial effusion is present. Moderate pleural effusion in the left lateral region.  5. The mitral valve is normal in structure. Mild mitral valve regurgitation. No evidence of mitral stenosis.  6. The aortic valve is normal in structure. Aortic valve regurgitation is mild. No aortic stenosis is present.  7. The inferior vena cava is normal in size with greater than 50% respiratory variability, suggesting right atrial pressure of 3 mmHg. FINDINGS  Left Ventricle: Left ventricular ejection fraction, by estimation, is 60 to 65%. The left ventricle has normal function. The left ventricle has no regional wall motion abnormalities. The left ventricular internal cavity size was normal in size. There is  mild left ventricular hypertrophy. Left ventricular diastolic parameters are consistent with Grade I diastolic dysfunction (impaired relaxation). Elevated left atrial pressure. Right Ventricle: The right ventricular size is normal.Right ventricular systolic function is normal. There is moderately elevated pulmonary artery systolic pressure. The tricuspid regurgitant velocity is 3.65 m/s, and with an assumed right atrial pressure of 3 mmHg, the estimated right ventricular systolic pressure is 22.2 mmHg. Left Atrium: Left atrial size was mildly dilated. Right Atrium: Right atrial size was normal in size. Pericardium: A small pericardial effusion is present. Mitral Valve: The mitral valve is normal in structure. Mild mitral valve  regurgitation. No evidence of mitral valve stenosis. Tricuspid Valve: The tricuspid valve is normal in structure. Tricuspid valve regurgitation is mild . No evidence of tricuspid stenosis. Aortic Valve: The aortic valve is normal in structure. Aortic valve regurgitation is mild. Aortic regurgitation PHT measures 379 msec. No aortic stenosis is present. Pulmonic Valve: The pulmonic valve was normal in structure. Pulmonic valve regurgitation is not visualized. No evidence of pulmonic stenosis. Aorta: The aortic root is normal in size and structure. Venous: The inferior vena cava is normal in size with greater than 50% respiratory variability, suggesting right atrial  pressure of 3 mmHg. IAS/Shunts: No atrial level shunt detected by color flow Doppler. Additional Comments: There is a moderate pleural effusion in the left lateral region.  LEFT VENTRICLE PLAX 2D LVIDd:         3.80 cm  Diastology LVIDs:         2.80 cm  LV e' medial:    5.98 cm/s LV PW:         1.20 cm  LV E/e' medial:  16.1 LV IVS:        1.30 cm  LV e' lateral:   9.25 cm/s LVOT diam:     2.10 cm  LV E/e' lateral: 10.4 LV SV:         81 LV SV Index:   53 LVOT Area:     3.46 cm  RIGHT VENTRICLE RV Basal diam:  2.20 cm RV S prime:     16.40 cm/s TAPSE (M-mode): 2.8 cm LEFT ATRIUM             Index       RIGHT ATRIUM           Index LA diam:        2.90 cm 1.90 cm/m  RA Area:     11.90 cm LA Vol (A2C):   28.0 ml 18.32 ml/m RA Volume:   27.60 ml  18.05 ml/m LA Vol (A4C):   62.0 ml 40.56 ml/m LA Biplane Vol: 43.8 ml 28.65 ml/m  AORTIC VALVE LVOT Vmax:   107.00 cm/s LVOT Vmean:  74.200 cm/s LVOT VTI:    0.235 m AI PHT:      379 msec  AORTA Ao Root diam: 3.10 cm MITRAL VALVE                TRICUSPID VALVE MV Area (PHT): 3.85 cm     TR Peak grad:   53.3 mmHg MV Decel Time: 197 msec     TR Vmax:        365.00 cm/s MV E velocity: 96.30 cm/s MV A velocity: 135.00 cm/s  SHUNTS MV E/A ratio:  0.71         Systemic VTI:  0.24 m                              Systemic Diam: 2.10 cm Kirk Ruths MD Electronically signed by Kirk Ruths MD Signature Date/Time: 08/26/2020/3:24:08 PM    Final    DG FEMUR MIN 2 VIEWS LEFT  Result Date: 08/16/2020 CLINICAL DATA:  Surgery, elective. Additional history provided: Left intramedullary (IM) nail inter trochanteric. Provided fluoroscopy time 1 minutes, 54 seconds (9.63 mGy). EXAM: LEFT FEMUR 2 VIEWS; DG C-ARM 1-60 MIN COMPARISON:  Radiographs of the right hip 08/30/2020. FINDINGS: Six intraoperative fluoroscopic images of the left hip/femur are submitted. On the provided images, there are findings of interval intramedullary nail placement within the left femur. There are 2 proximal interlocking screws which traverse the femoral head/neck. A single distal interlocking screw is also present. The hardware traverses a known fracture of the proximal left femur. Persistent, although significantly improved, fracture displacement. IMPRESSION: Six intraoperative fluoroscopic images of the left hip/femur from left femoral intramedullary nail placement, as described. Electronically Signed   By: Kellie Simmering DO   On: 09/09/2020 12:34   VAS [place] LOWER EXTREMITY ARTERIAL DUPLEX  Result Date: 08/18/2020 LOWER EXTREMITY ARTERIAL DUPLEX STUDY Patient Name:  Andrea Shepherd  Date of Exam:   08/20/2020  Medical Rec #: [id number]    Accession #:    [id number] Date of Birth: [birth year]     Patient Gender: F Patient Age:   57Y Exam Location[facility]  [facility] Procedure:      VAS [place] LOWER EXTREMITY ARTERIAL DUPLEX Referring Phys: [id number] [place] --------------------------------------------------------------------------------  Indications: Ischemic, Cold, mottled foot. History of PAD with multiple              interventions at outside facility. High Risk Factors: Hypertension, current smoker. Other Factors: Hip fracture5/25/22 with repair 08/26/2020.  Current ABI: n/a Comparison Study: No prior study Performing Technologist: Sharion Dove RVS   Examination Guidelines: A complete evaluation includes B-mode imaging, spectral Doppler, color Doppler, and power Doppler as needed of all accessible portions of each vessel. Bilateral testing is considered an integral part of a complete examination. Limited examinations for reoccurring indications may be performed as noted.  +----------+--------+-----+---------------+------------------+-----------------+ LEFT      PSV cm/sRatioStenosis       Waveform          Comments          +----------+--------+-----+---------------+------------------+-----------------+ CFA Prox  29                          monophasic                          +----------+--------+-----+---------------+------------------+-----------------+ SFA Prox  140          30-49% stenosismonophasic        stenosis based on                                                         plaque formation  +----------+--------+-----+---------------+------------------+-----------------+ SFA Mid   11                          monophasic        areas of                                                                  occlusion noted                                                           in the mid SFA    +----------+--------+-----+---------------+------------------+-----------------+ SFA Distal15                          monophasic        areas of  occlusion noted                                                           in the distal SFA +----------+--------+-----+---------------+------------------+-----------------+ POP Prox  15                          dampened                                                                  monophasic                          +----------+--------+-----+---------------+------------------+-----------------+ POP Mid   36                          dampened                                                                   monophasic                          +----------+--------+-----+---------------+------------------+-----------------+ POP Distal13                          dampened                                                                  monophasic                          +----------+--------+-----+---------------+------------------+-----------------+ ATA Prox               occluded                                           +----------+--------+-----+---------------+------------------+-----------------+ ATA Mid                occluded                                           +----------+--------+-----+---------------+------------------+-----------------+ ATA Distal             occluded                                           +----------+--------+-----+---------------+------------------+-----------------+  PTA Prox               occluded                                           +----------+--------+-----+---------------+------------------+-----------------+ PTA Mid                occluded                                           +----------+--------+-----+---------------+------------------+-----------------+ PTA Distal             occluded                                           +----------+--------+-----+---------------+------------------+-----------------+  Summary: Left: 30-49% stenosis noted in the mid superficial femoral artery. Areas of occlusion noted in the mid and distal SFA. The ATA and PTA appear occluded. Significant, diffuse, calcific plaque noted throughout the left lower extremity.  See table(s) above for measurements and observations.    Preliminary    HYBRID OR IMAGING ([facility] ONLY)  Result Date: 09/09/2020 There is no interpretation for this exam.  This order is for images obtained during a surgical procedure.  Please See "Surgeries" Tab for more information regarding the procedure.         Scheduled Meds: . sodium chloride   Intravenous Once  . sodium chloride   Intravenous Once  . chlorhexidine  15 mL Mouth/Throat Once  . clopidogrel  75 mg Oral Daily  . diltiazem  120 mg Oral BID  . docusate sodium  100 mg Oral BID  . feeding supplement  237 mL Oral TID BM  . gabapentin  100 mg Oral QHS  . hydrALAZINE  50 mg Oral Q8H  . multivitamin with minerals  1 tablet Oral Daily  . simvastatin  20 mg Oral q1800   Continuous Infusions: . sodium chloride 10 mL/hr at 08/11/2020 0932  . sodium chloride 75 mL/hr at 08/16/2020 1335  . sodium chloride    . methocarbamol (ROBAXIN) IV       LOS: 3 days     Cordelia Poche, MD [facility] 09/08/2020, 8:46 AM  If 7PM-7AM, please contact night-coverage [email or web address]

## 2020-09-08 NOTE — Progress Notes (Signed)
Pt. Arrived from PACU via bed. CCMD notified as well as MD orders being carried out. Pt. GCS-15 with Rt. Radial A-line with pulsatile waveform. See PCR for vitals. Will continue to watch closely. + Doppler (PT)signals noted on arrival with Bilat. Groins Level 0.

## 2020-09-08 NOTE — Procedures (Signed)
Ultrasound-guided therapeutic right sided thoracentesis performed yielding 650 mililiters of straw colored fluid. No immediate complications. Follow-up chest x-ray pending. EBL is < 24ml.

## 2020-09-08 NOTE — NC FL2 (Signed)
[place] LEVEL OF CARE SCREENING TOOL     IDENTIFICATION  Patient Name: Andrea Shepherd Birthdate: 1935/01/09 Sex: female Admission Date (Current Location): 08/21/2020  [facility] and [place] Number:  Herbalist and Address:  The . [facility], [place] [address] [place], [place] [address]      Provider Number: [id number]  Attending Physician Name and Address:  Mariel Aloe, MD  Relative Name and Phone Number:  [name] no home phone patient reports to call [employer] [employer] to help get in touch with spouse    Current Level of Care: Hospital Recommended Level of Care: [place] Prior Approval Number:    Date Approved/Denied:   PASRR Number: [id number]  Discharge Plan: SNF    Current Diagnoses: Patient Active Problem List   Diagnosis Date Noted  . Status post surgery 08/14/2020  . Closed left hip fracture ([place]) 09/05/2020  . Hypertensive urgency 09/05/2020  . Hypokalemia 09/05/2020  . Normocytic anemia 09/05/2020  . Closed fracture of 5th metacarpal 09/05/2020  . Pleural effusion, bilateral 09/05/2020  . Atherosclerosis of native artery of both lower extremities with intermittent claudication ([place]) 08/08/2015  . Abdominal aortic aneurysm (AAA) ([place]) 07/08/2015  . Benign essential hypertension 07/08/2015  . Nicotine dependence 06/26/2015  . Peripheral arterial occlusive disease ([place]) 06/26/2015  . Anxiety 06/03/2015  . CKD (chronic kidney disease), stage IV ([place]) 06/03/2015  . Macular degeneration 06/03/2015  . High risk medication use 05/03/2015  . Peripheral arterial disease ([place]) 05/03/2015  . Atherosclerosis of artery of extremity with intermittent claudication ([place]) 04/05/2015  . Unspecified atherosclerosis 04/05/2015  . Carotid artery stenosis 05/25/2013    Orientation RESPIRATION BLADDER Height & Weight     Self,Time,Situation,Place  O2 (Nasal Cannula 2 liters) Continent Weight: 101 lb (45.8 kg) Height:   5\' 8"  (172.7 cm)  BEHAVIORAL SYMPTOMS/MOOD NEUROLOGICAL BOWEL NUTRITION STATUS      Continent (WDL) Diet (See Discharge Summary)  AMBULATORY STATUS COMMUNICATION OF NEEDS Skin   Limited Assist Verbally Other (Comment) (Incision closed Hip Left,Incision closed Groin Left,Incision Closed Groin Right)                       Personal Care Assistance Level of Assistance  Bathing,Feeding,Dressing Bathing Assistance: Maximum assistance Feeding assistance: Independent Dressing Assistance: Maximum assistance     Functional Limitations Info  Sight,Hearing,Speech Sight Info: Adequate Hearing Info: Adequate Speech Info: Adequate    SPECIAL CARE FACTORS FREQUENCY  PT (By licensed PT),OT (By licensed OT)     PT Frequency: 5x min weekly OT Frequency: 5x min weekly            Contractures Contractures Info: Not present    Additional Factors Info  Code Status,Allergies Code Status Info: FULL Allergies Info: Sulfamethoxazole-trimethoprim,Nitrofurantoin           Current Medications (09/08/2020):  This is the current hospital active medication list Current Facility-Administered Medications  Medication Dose Route Frequency Provider Last Rate Last Admin  . 0.9 %  sodium chloride infusion (Manually program via Guardrails IV Fluids)   Intravenous Once Barbie Banner, PA-C      . 0.9 %  sodium chloride infusion (Manually program via Guardrails IV Fluids)   Intravenous Once Setzer, Edman Circle, PA-C      . 0.9 %  sodium chloride infusion   Intravenous Continuous Barbie Banner, PA-C 10 mL/hr at 09/03/2020 0932 Restarted at 09/05/2020 1011  . 0.9 %  sodium chloride infusion  Intravenous Continuous Barbie Banner, PA-C 75 mL/hr at 09/09/2020 1335 New Bag at 09/05/2020 1335  . 0.9 %  sodium chloride infusion  500 mL Intravenous Once PRN Barbie Banner, PA-C      . acetaminophen (TYLENOL) tablet 325-650 mg  325-650 mg Oral Q6H PRN Setzer, Edman Circle, PA-C      . alum & mag hydroxide-simeth  (MAALOX/MYLANTA) 200-200-20 MG/5ML suspension 30 mL  30 mL Oral Q4H PRN Setzer, Edman Circle, PA-C      . chlorhexidine (PERIDEX) 0.12 % solution 15 mL  15 mL Mouth/Throat Once Setzer, Edman Circle, PA-C      . clopidogrel (PLAVIX) tablet 75 mg  75 mg Oral Daily Barbie Banner, PA-C   75 mg at 09/08/20 0913  . diltiazem (CARDIZEM CD) 24 hr capsule 120 mg  120 mg Oral BID Barbie Banner, PA-C   120 mg at 09/08/20 0912  . docusate sodium (COLACE) capsule 100 mg  100 mg Oral BID Barbie Banner, PA-C   100 mg at 09/08/20 5916  . feeding supplement (ENSURE ENLIVE / ENSURE PLUS) liquid 237 mL  237 mL Oral TID BM Setzer, Edman Circle, PA-C   237 mL at 09/08/20 0930  . fentaNYL (SUBLIMAZE) injection 25-50 mcg  25-50 mcg Intravenous Q2H PRN Barbie Banner, PA-C   50 mcg at 08/21/2020 0100  . gabapentin (NEURONTIN) capsule 100 mg  100 mg Oral QHS Setzer, Sandra J, PA-C      . guaiFENesin-dextromethorphan (ROBITUSSIN DM) 100-10 MG/5ML syrup 15 mL  15 mL Oral Q4H PRN Setzer, Edman Circle, PA-C      . hydrALAZINE (APRESOLINE) tablet 25 mg  25 mg Oral Q6H PRN Barbie Banner, PA-C   25 mg at 09/08/20 0913  . hydrALAZINE (APRESOLINE) tablet 50 mg  50 mg Oral Q8H SetzerEdman Circle, PA-C   50 mg at 09/08/20 0549  . HYDROmorphone (DILAUDID) injection 0.5-1 mg  0.5-1 mg Intravenous Q4H PRN Barbie Banner, PA-C   1 mg at 09/08/20 0154  . labetalol (NORMODYNE) injection 10 mg  10 mg Intravenous Q10 min PRN Setzer, Edman Circle, PA-C      . magnesium citrate solution 1 Bottle  1 Bottle Oral Once PRN Setzer, Edman Circle, PA-C      . menthol-cetylpyridinium (CEPACOL) lozenge 3 mg  1 lozenge Oral PRN Setzer, Edman Circle, PA-C       Or  . phenol (CHLORASEPTIC) mouth spray 1 spray  1 spray Mouth/Throat PRN Setzer, Edman Circle, PA-C      . methocarbamol (ROBAXIN) tablet 500 mg  500 mg Oral Q6H PRN Setzer, Edman Circle, PA-C       Or  . methocarbamol (ROBAXIN) 500 mg in dextrose 5 % 50 mL IVPB  500 mg Intravenous Q6H PRN Setzer, Edman Circle, PA-C       . metoprolol tartrate (LOPRESSOR) injection 2-5 mg  2-5 mg Intravenous Q2H PRN Setzer, Edman Circle, PA-C      . multivitamin with minerals tablet 1 tablet  1 tablet Oral Daily Barbie Banner, PA-C   1 tablet at 09/08/20 0913  . ondansetron (ZOFRAN) tablet 4 mg  4 mg Oral Q6H PRN Setzer, Edman Circle, PA-C       Or  . ondansetron [facility]) injection 4 mg  4 mg Intravenous Q6H PRN Setzer, Edman Circle, PA-C      . oxyCODONE (Oxy IR/ROXICODONE) immediate release tablet 10-15 mg  10-15 mg Oral Q4H PRN Setzer, Edman Circle, PA-C  10 mg at 09/08/20 0154  . oxyCODONE (Oxy IR/ROXICODONE) immediate release tablet 5-10 mg  5-10 mg Oral Q4H PRN Barbie Banner, PA-C   10 mg at 08/26/2020 1521  . polyethylene glycol (MIRALAX / GLYCOLAX) packet 17 g  17 g Oral Daily Mariel Aloe, MD      . potassium chloride SA (KLOR-CON) CR tablet 20-40 mEq  20-40 mEq Oral Daily PRN Setzer, Edman Circle, PA-C      . senna-docusate (Senokot-S) tablet 1 tablet  1 tablet Oral QHS PRN Setzer, Edman Circle, PA-C      . simvastatin (ZOCOR) tablet 20 mg  20 mg Oral q1800 Barbie Banner, PA-C   20 mg at 09/02/2020 1849  . sorbitol 70 % solution 30 mL  30 mL Oral Daily PRN Barbie Banner, PA-C         Discharge Medications: Please see discharge summary for a list of discharge medications.  Relevant Imaging Results:  Relevant Lab Results:   Additional Information [id number]  Trula Ore, LCSWA

## 2020-09-08 NOTE — Plan of Care (Signed)

## 2020-09-08 NOTE — TOC Initial Note (Addendum)
Transition of Care [facility]) - Initial/Assessment Note    Patient Details  Name: Andrea Shepherd MRN: [id number] Date of Birth: Dec 19, 1934  [facility] CM/SW Contact:    Andrea Shepherd, [place] Phone Number: 09/08/2020, 12:14 PM  Clinical Narrative:                  CSW received consult for possible SNF placement at time of discharge. CSW spoke with patient at bedside regarding PT recommendation of SNF placement at time of discharge. Patient expressed understanding of PT recommendation and is agreeable to SNF placement at time of discharge. Patient gave CSW permission to fax out initial referral near [place] area. Patient has not received the COVID vaccines. Patient reports that to get in contact with patients spouse when medically ready that [place] will need to call [facility] and ask to speak with Andrea Shepherd. Patient says someone from there will get in touch with Andrea Shepherd and that he can use their phone. Patient is concerned and says that she lost her teeth downstairs. Patient reports she was in Room 15 and says that might be where her teeth are. CSW assured patient we will do everything we can to help locate her teeth.Patient thanked CSW. CSW let patient know she will follow up with patients nurse to help locate her teeth. No further questions reported at this time. CSW to continue to follow and assist with discharge planning needs.   Expected Discharge Plan: Skilled Nursing Facility Barriers to Discharge: Continued Medical Work up   Patient Goals and CMS Choice Patient states their goals for this hospitalization and ongoing recovery are:: to go to SNF CMS Medicare.gov Compare Post Acute Care list provided to:: Patient Choice offered to / list presented to : Patient  Expected Discharge Plan and Services Expected Discharge Plan: [place] In-house Referral: Clinical Social Work     Living arrangements for the past 2 months: [place]                                       Prior Living Arrangements/Services Living arrangements for the past 2 months: Single Family Home Lives with:: Self,Spouse (lives with spouse Andrea Shepherd) Patient language and need for interpreter reviewed:: Yes Do you feel safe going back to the place where you live?: No   SNF  Need for Family Participation in Patient Care: Yes (Comment) Care giver support system in place?: Yes (comment)   Criminal Activity/Legal Involvement Pertinent to Current Situation/Hospitalization: No - Comment as needed  Activities of Daily Living      Permission Sought/Granted Permission sought to share information with : Case Manager,Family Chief Financial Officer Permission granted to share information with : Yes, Verbal Permission Granted  Share Information with NAME: Andrea Shepherd  Permission granted to share info w AGENCY: SNF  Permission granted to share info w Relationship: spouse  Permission granted to share info w Contact Information: Andrea Shepherd will need to call [employer] [employer] and ask to speak with Andrea Shepherd. Patient reports they do not have home phone  Emotional Assessment Appearance:: Appears stated age Attitude/Demeanor/Rapport: Gracious Affect (typically observed): Calm Orientation: : Oriented to Self,Oriented to Place,Oriented to  Time,Oriented to Situation Alcohol / Substance Use: Not Applicable Psych Involvement: No (comment)  Admission diagnosis:  Fall [W19.XXXA] Closed left hip fracture ([place]) [S72.002A] Closed fracture of left hip, initial encounter ([place]) [S72.002A] Fall from standing, initial encounter [W19.XXXA]  Closed displaced fracture of neck of fifth metacarpal bone of left hand, initial encounter [S62.337A] Status post surgery [Z98.890] Patient Active Problem List   Diagnosis Date Noted  . Status post surgery 08/22/2020  . Closed left hip fracture ([place]) 09/05/2020  . Hypertensive urgency 09/05/2020  . Hypokalemia 09/05/2020  . Normocytic anemia  09/05/2020  . Closed fracture of 5th metacarpal 09/05/2020  . Pleural effusion, bilateral 09/05/2020  . Atherosclerosis of native artery of both lower extremities with intermittent claudication ([place]) 08/08/2015  . Abdominal aortic aneurysm (AAA) ([place]) 07/08/2015  . Benign essential hypertension 07/08/2015  . Nicotine dependence 06/26/2015  . Peripheral arterial occlusive disease ([place]) 06/26/2015  . Anxiety 06/03/2015  . CKD (chronic kidney disease), stage IV ([place]) 06/03/2015  . Macular degeneration 06/03/2015  . High risk medication use 05/03/2015  . Peripheral arterial disease ([place]) 05/03/2015  . Atherosclerosis of artery of extremity with intermittent claudication ([place]) 04/05/2015  . Unspecified atherosclerosis 04/05/2015  . Carotid artery stenosis 05/25/2013   PCP:  Rochel Brome, MD Pharmacy:   [place], [place] [address] Phone: [phone] Fax: [phone]     Social Determinants of Health (SDOH) Interventions    Readmission Risk Interventions No flowsheet data found.

## 2020-09-08 NOTE — Progress Notes (Addendum)
     Subjective: 1 Day Post-Op Procedure(s) (LRB): AORTOGRAM (Right) INTRA OPERATIVE ARTERIOGRAM LEFT LOWER LEG (Right) REDO FERMORAL ENDARTERECTOMY WITH PROFUNDAPLASTY (Left) PATCH ANGIOPLASTY USING XENOSURE BIOLOGIC PATCH 1cm x 14cm (Left) ULTRASOUND GUIDANCE FOR VASCULAR ACCESS FEMORAL ARTERY (Right) Awake, alert and oriented x3. Needs some assistance with food and feeding.   Patient reports pain as moderate.    Objective:   VITALS:  Temp:  [97 F (36.1 C)-98.4 F (36.9 C)] 98.4 F (36.9 C) (05/29 0800) Pulse Rate:  [52-97] 97 (05/29 0900) Resp:  [11-20] 18 (05/29 0900) BP: (159-189)/(38-60) 189/58 (05/29 0900) SpO2:  [92 %-100 %] 92 % (05/29 0900) Arterial Line BP: (156-224)/(37-118) 156/73 (05/29 0900) Weight:  [45.8 kg] 45.8 kg (05/28 1814)  Neurologically intact ABD soft Dressings left lateral hip, proximal femur laterally and distal lateral femur intact, no drainage.  Intrinsic motor left foot appears to be intact today Still some persistent mottling of the left foot and distal leg.   LABS Recent Labs    09/02/2020 0140 08/12/2020 1449 08/28/2020 2022 08/12/2020 2128 09/08/20 0500  HGB 7.1* 7.1* 5.1* 9.2* 13.1  WBC 8.6  --   --   --  7.6  PLT 247  --   --   --  171   Recent Labs    09/01/2020 0140 08/25/2020 2022 08/13/2020 2128 09/08/20 0500  NA 136   < > 134* 137  K 4.5   < > 5.5* 5.1  CL 106  --  109 109  CO2 16*  --   --  17*  BUN 49*  --  49* 54*  CREATININE 2.86*  --  3.10* 3.08*  GLUCOSE 165*  --  131* 120*   < > = values in this interval not displayed.   No results for input(s): LABPT, INR in the last 72 hours.   Assessment/Plan: 1 Day Post-Op Procedure(s) (LRB): AORTOGRAM (Right) INTRA OPERATIVE ARTERIOGRAM LEFT LOWER LEG (Right) REDO FERMORAL ENDARTERECTOMY WITH PROFUNDAPLASTY (Left) PATCH ANGIOPLASTY USING XENOSURE BIOLOGIC PATCH 1cm x 14cm (Left) ULTRASOUND GUIDANCE FOR VASCULAR ACCESS FEMORAL ARTERY (Right) POD#2 left intertrochanteric hip  fracture ORIF with long trochanteric IM nail. Left foot with persistent mottling and coolness though it is better than prior to revascularization. Upper left leg is much better. She has severe PVD and appreciate Dr. Anell Barr help with this serious Condition. Renal Disease with slight bump in creatinine as expected with need for contrast and periop volumn changes.   Advance diet Up with therapy when stable. [name] 09/08/2020, 12:06 PMPatient ID: Andrea Shepherd, female   DOB: July 02, 1934, 86 y.o.   MRN: [id number]

## 2020-09-08 NOTE — Progress Notes (Signed)
[facility]  Subjective  -states her left foot subjectively feels better with numbness improved and can wiggle her toes a little bit.   Objective (!) 189/58 97 98.4 F (36.9 C) (Oral) 18 92%  Intake/Output Summary (Last 24 hours) at 09/08/2020 1962 Last data filed at 09/08/2020 0549 Gross per 24 hour  Intake 2460 ml  Output 1050 ml  Net 1410 ml    Right common femoral pulse palpable where the sheath was removed Left groin incision clean dry and intact with palpable femoral pulse Left leg mottling improved with some mottling in the foot still below the ankle Left PT monophasic at the ankle Able to wiggle left toes slightly now  Laboratory Lab Results: Recent Labs    09/03/2020 0140 08/31/2020 1449 09/05/2020 2128 09/08/20 0500  WBC 8.6  --   --  7.6  HGB 7.1*   < > 9.2* 13.1  HCT 21.8*   < > 27.0* 38.5  PLT 247  --   --  171   < > = values in this interval not displayed.   BMET Recent Labs    08/18/2020 0140 08/16/2020 2022 08/18/2020 2128 09/08/20 0500  NA 136   < > 134* 137  K 4.5   < > 5.5* 5.1  CL 106  --  109 109  CO2 16*  --   --  17*  GLUCOSE 165*  --  131* 120*  BUN 49*  --  49* 54*  CREATININE 2.86*  --  3.10* 3.08*  CALCIUM 8.2*  --   --  8.0*   < > = values in this interval not displayed.    COAG Lab Results  Component Value Date   INR 0.9 09/02/2020   No results found for: PTT  Assessment/Planning:  86 year old female postop day 1 status post redo left common femoral endarterectomy with profundoplasty and bovine pericardial patch angioplasty including thrombectomy of the distal left external iliac stent.  This is likely an acute on chronic ischemic event ongoing since Wednesday.  Her leg does look better with improvement in the mottling from the knee to the ankle.  She still has some mottling in the foot.  She does have a monophasic PT signal at the ankle this morning.  I discussed she has no further options for  revascularization from my standpoint and we will have to see how her foot progresses in the days ahead.  Recommend aspirin plavix for dual anti-platelet therapy.  Marty Heck 09/08/2020 9:22 AM --

## 2020-09-09 ENCOUNTER — Inpatient Hospital Stay (HOSPITAL_COMMUNITY): Payer: Medicare Other

## 2020-09-09 DIAGNOSIS — S72002A Fracture of unspecified part of neck of left femur, initial encounter for closed fracture: Secondary | ICD-10-CM | POA: Diagnosis not present

## 2020-09-09 DIAGNOSIS — E876 Hypokalemia: Secondary | ICD-10-CM | POA: Diagnosis not present

## 2020-09-09 DIAGNOSIS — I160 Hypertensive urgency: Secondary | ICD-10-CM | POA: Diagnosis not present

## 2020-09-09 DIAGNOSIS — N184 Chronic kidney disease, stage 4 (severe): Secondary | ICD-10-CM | POA: Diagnosis not present

## 2020-09-09 LAB — PROTEIN, PLEURAL OR PERITONEAL FLUID: Total protein, fluid: <3 g/dL

## 2020-09-09 LAB — BASIC METABOLIC PANEL
Anion gap: 8 (ref 5–15)
BUN: 65 mg/dL — ABNORMAL HIGH (ref 8–23)
CO2: 19 mmol/L — ABNORMAL LOW (ref 22–32)
Calcium: 7.9 mg/dL — ABNORMAL LOW (ref 8.9–10.3)
Chloride: 110 mmol/L (ref 98–111)
Creatinine, Ser: 3.51 mg/dL — ABNORMAL HIGH (ref 0.44–1.00)
GFR, Estimated: 12 mL/min — ABNORMAL LOW (ref >60)
Glucose, Bld: 101 mg/dL — ABNORMAL HIGH (ref 70–99)
Potassium: 5.3 mmol/L — ABNORMAL HIGH (ref 3.5–5.1)
Sodium: 137 mmol/L (ref 135–145)

## 2020-09-09 LAB — CBC
HCT: 39.4 % (ref 36.0–46.0)
Hemoglobin: 13.4 g/dL (ref 12.0–15.0)
MCH: 31.2 pg (ref 26.0–34.0)
MCHC: 34 g/dL (ref 30.0–36.0)
MCV: 91.6 fL (ref 80.0–100.0)
Platelets: 188 10*3/uL (ref 150–400)
RBC: 4.3 MIL/uL (ref 3.87–5.11)
RDW: 17.9 % — ABNORMAL HIGH (ref 11.5–15.5)
WBC: 10.2 10*3/uL (ref 4.0–10.5)
nRBC: 0 % (ref 0.0–0.2)

## 2020-09-09 LAB — GRAM STAIN: Gram Stain: NONE SEEN

## 2020-09-09 LAB — LACTATE DEHYDROGENASE, PLEURAL OR PERITONEAL FLUID: LD, Fluid: 108 U/L — ABNORMAL HIGH (ref 3–23)

## 2020-09-09 MED ORDER — FUROSEMIDE 10 MG/ML IJ SOLN
20.0000 mg | Freq: Once | INTRAMUSCULAR | Status: AC
  Administered 2020-09-09: 20 mg via INTRAVENOUS
  Filled 2020-09-09: qty 2

## 2020-09-09 MED ORDER — SODIUM CHLORIDE 0.9 % IV SOLN: INTRAVENOUS | Status: DC

## 2020-09-09 MED ORDER — ALBUTEROL SULFATE (2.5 MG/3ML) 0.083% IN NEBU
2.5000 mg | INHALATION_SOLUTION | Freq: Once | RESPIRATORY_TRACT | Status: AC
  Administered 2020-09-09: 2.5 mg via RESPIRATORY_TRACT
  Filled 2020-09-09: qty 3

## 2020-09-09 NOTE — Progress Notes (Signed)
Pt did void approx 30 cc in purwick. Bladder scan showed 36 cc. Pt does not complain of any discomfort. Will continue to monitor.

## 2020-09-09 NOTE — Progress Notes (Signed)
Prevena dressing placed to left groin incision with good seal.

## 2020-09-09 NOTE — Progress Notes (Addendum)
Talked to DR. Hal Hope. Notified MD that pt's Bp is high , I had given her prn x2. Also pt is oozing from previous Iv sites in both of her arm where it was infiltrated. Also Left groin incision site is oozing serosanguineous fluid. Pt is wheezing , little sob in 2l o2 via [place], and I have stopped the fluids. MD stated he will order some lasix and chest x-ray. Updated MD that pt had thoracocentesis yesterday. Will continue to monitor.

## 2020-09-09 NOTE — Progress Notes (Signed)
Physical Therapy Treatment Patient Details Name: Andrea Shepherd MRN: [id number] DOB: 03-02-1935 Today's Date: 09/09/2020    History of Present Illness 86 y.o. F admitted to [facility] on 5/25 due to a fall, resulting in sharp pain in LLE and L hand. [name] showed an acute fracture of distal left fifth metacarpal diaphysis and a closed L hip fx. On 5/27  L Hip IM Nail. 5/28 left common femoral endarterectomy. 5/29 thoracentesis. PMHx: PAD, hypertension, chronic kidney disease stage IV, and current smoker.    PT Comments    Pt pleasant but confused with decreased recall of events and precautions. Pt frequently stating "wait a minute" and requires increased time for all transfers and mobility. Pt with limited ability to follow commands and decreased activity tolerance. D/C to SNF remains appropriate and educated pt of need for clarification of orders for LLE weight bearing and to currently maintin TDWB with activity. Will continue to follow.    Follow Up Recommendations  SNF     Equipment Recommendations  Other (comment) (left platform RW)    Recommendations for Other Services       Precautions / Restrictions Precautions Precautions: Fall Required Braces or Orthoses: Splint/Cast Splint/Cast: splint left hand Restrictions Weight Bearing Restrictions: Yes LUE Weight Bearing: Weight bear through elbow only LLE Weight Bearing: Touchdown weight bearing Other Position/Activity Restrictions: TWB per order set, WBAT per op note for L LE - awaiting clarification    Mobility  Bed Mobility Overal bed mobility: Needs Assistance Bed Mobility: Supine to Sit     Supine to sit: Mod assist;+2 for physical assistance Sit to supine: Max assist   General bed mobility comments: assist to move LLE with cues not to pull on LUE, physical assist to bring trunk up from surface with increased time and cues    Transfers Overall transfer level: Needs assistance Equipment used: 2 person hand held  assist Transfers: Squat Pivot Transfers     Squat pivot transfers: Mod assist;+2 physical assistance     General transfer comment: attempted sit to stand with platform RW but pt not assisting significantly. Transitioned to squat pivot with LLE supported on P.T. foot to monitor TDWB and bil arms hooked for support with good power up on RLE to pivot to right to bSC. Once there pt stated she needed extended time and discussed with OT and RN who then agreed to return to check on pt  Ambulation/Gait             General Gait Details: unable   Stairs             Wheelchair Mobility    Modified Rankin (Stroke Patients Only)       Balance Overall balance assessment: Needs assistance Sitting-balance support: Bilateral upper extremity supported;Feet supported Sitting balance-Leahy Scale: Fair Sitting balance - Comments: EOb with guarding for stability, good at [facility]   Standing balance support: Bilateral upper extremity supported Standing balance-Leahy Scale: Zero Standing balance comment: physical assist for standing to maintain precautions and balance                            Cognition Arousal/Alertness: Awake/alert Behavior During Therapy: Flat affect Overall Cognitive Status: Impaired/Different from baseline Area of Impairment: Attention;Orientation;Memory;Safety/judgement                 Orientation Level: Disoriented to;Time Current Attention Level: Sustained Memory: Decreased short-term memory   Safety/Judgement: Decreased awareness of deficits     General  Comments: pt did not recall thoracentesis. states "wait a minute" throughout without awareness of deficits or precautions      Exercises      General Comments        Pertinent Vitals/Pain Pain Assessment: Faces Faces Pain Scale: Hurts little more Pain Location: LLE Pain Descriptors / Indicators: Aching;Grimacing;Discomfort Pain Intervention(s): Monitored during  session;Repositioned    Home Living                      Prior Function            PT Goals (current goals can now be found in the care plan section) Progress towards PT goals: Progressing toward goals    Frequency    Min 3X/week      PT Plan Current plan remains appropriate    Co-evaluation              AM-PAC PT "6 Clicks" Mobility   Outcome Measure  Help needed turning from your back to your side while in a flat bed without using bedrails?: A Lot Help needed moving from lying on your back to sitting on the side of a flat bed without using bedrails?: Total Help needed moving to and from a bed to a chair (including a wheelchair)?: Total Help needed standing up from a chair using your arms (e.g., wheelchair or bedside chair)?: Total Help needed to walk in hospital room?: Total Help needed climbing 3-5 steps with a railing? : Total 6 Click Score: 7    End of Session Equipment Utilized During Treatment: Gait belt Activity Tolerance: Patient tolerated treatment well Patient left: Other (comment);with call bell/phone within reach [facility]) Nurse Communication: Mobility status;Precautions;Weight bearing status PT Visit Diagnosis: Other abnormalities of gait and mobility (R26.89);Difficulty in walking, not elsewhere classified (R26.2);Muscle weakness (generalized) (M62.81)     Time: 2023-3435 PT Time Calculation (min) (ACUTE ONLY): 38 min  Charges:  $Therapeutic Activity: 23-37 mins                     Davius Goudeau P, PT Acute Rehabilitation Services Pager: [phone] Office: [place] [place] 09/09/2020, 11:09 AM

## 2020-09-09 NOTE — Progress Notes (Signed)
PROGRESS NOTE    Andrea Shepherd  [name] DOB: [birth year] DOA: 08/29/2020 PCP: Rochel Brome, MD   Brief Narrative: Andrea Shepherd is a 86 y.o. female with a history of PAD, hypertension, CKD stage IV, tobacco use, breast cancer status postmastectomy/radiation.  Patient presented secondary to a fall after getting up out of her and stubbing her toe on a chair.  She is found to have a left hip fracture.  Orthopedic surgery was consulted on admission. ORIF performed on 5/27. Patient developed evidence of critical limb ischemia requiring urgent aortogram w/thrombectomy/endarterectomy performed on  5/28.   Assessment & Plan:   Principal Problem:   Closed left hip fracture (HCC) Active Problems:   CKD (chronic kidney disease), stage IV (HCC)   Peripheral arterial occlusive disease (HCC)   Hypertensive urgency   Hypokalemia   Normocytic anemia   Closed fracture of 5th metacarpal   Pleural effusion, bilateral   Status post surgery   Left hip fracture Orthopedic surgery consulted. S/p ORIF on 5/27. -Orthopedic surgery recommendations: advance diet, up with therapy  Left 5th metacarpal fracture Secondary to fall. Ulnar gutter splint placed in the ED -Orthopedic surgery recommendations: continue splint  Hypertensive urgency Patient is on diltiazem as an outpatient.  Blood pressure continues to be elevated with significantly high pulse pressure. -Continue Cardizem CD 120 mg daily -Continue hydralazine 50 mg TID and titrate up for control  Bilateral pleural effusion Unknown etiology.  Patient with recent CT scan with small volume effusions at that time contributed to underlying COPD.  Concerning with patient's history of breast cancer in the past.  Patient is mildly symptomatic with mild cough and orthopnea. Cardiomegaly on imaging with an elevated BNP of 702. Transthoracic Echocardiogram significant for a normal EF of 60-65% with grade 1 diastolic dysfunction and small pericardial  effusion. Right thoracentesis performed on 5/29 with 650 mL removed. Fluid does not appear to be infectious related. -Pleural fluid protein/LDH pending, pleural fluid culture and cytology also pending  CKD stage IV Baseline creatinine of about 2.6.  Creatinine is currently stable  PAD Claudication Patient has a history of femoral bypass bilaterally with right femoral artery stent placed.  Patient is on Plavix as an outpatient.  She is also on simvastatin. Patient developed evidence if acutely ischemic limb of her left leg on 5/28. Vascular surgery was consulted and urgent aortogram was performed on 5/28; thrombectomy of left distal external iliac stent/left common femoral endarterectomy performed. -Continue simvastatin -Plavix restarted -Vascular surgery recommendations: concern she may need left leg amputation; no further revascularization options  Acute blood loss anemia Chronic anemia In setting of kidney diease. Acute drop likely secondary to perioperative blood loss. Patient received 4 units of PRBC periop on 5/28. Post transfusion hemoglobin of 13.1 and stable -Trend CBC -Transfuse for hemoglobin <7  Abdominal bruit With history of poorly controlled hypertension, recurrent hypertensive urgency, kidney disease, it is possible patient has a degree of renal artery stenosis. She has had vascular procedures performed in the past. Will avoid ACEi for treatment of blood pressure. Discussed with vascular surgery and recommendation to continue medical management. -Continue BP control with medical management   DVT prophylaxis: SCDs Code Status:   Code Status: Full Code Family Communication: None at bedside Disposition Plan: Discharge likely in 3-5 days pending orthopedic surgery management, vascular surgery recommendations and eventual PT/OT recommendations   Consultants:   Orthopedic surgery  Vascular surgery  Procedures:   INTRAMEDULLARY NAIL OF LEFT HIP (08/15/2020)  AORTOGRAM  (09/05/2020) 1.  Ultrasound-guided access of right common femoral artery 2.  Aortogram including catheter selection of aorta 3.  Left lower extremity arteriogram with selection of second-order branches 4.  Redo exposure of left common femoral artery greater than 30 days 5.  Thrombectomy of left distal external iliac stent 6.  Redo left common femoral endarterectomy with profundoplasty and bovine pericardial patch angioplasty  Antimicrobials:  None    Subjective: No concerns today. Leg feels better.  Objective: Vitals:   09/09/20 0342 09/09/20 0700 09/09/20 0719 09/09/20 0754  BP: (!) 171/60  (!) 163/52   Pulse: 76 71 63   Resp: 17  16   Temp: 98.1 F (36.7 C) 98 F (36.7 C) 98 F (36.7 C)   TempSrc: Oral Oral Oral   SpO2: 96% 96% 96% 94%  Weight:      Height:        Intake/Output Summary (Last 24 hours) at 09/09/2020 1034 Last data filed at 09/09/2020 0800 Gross per 24 hour  Intake 240 ml  Output --  Net 240 ml   Filed Weights   08/22/2020 2208 08/28/2020 1814  Weight: 45.8 kg 45.8 kg    Examination:  General exam: Appears calm and comfortable Respiratory system: Clear to auscultation. Respiratory effort normal. Cardiovascular system: S1 & S2 heard, RRR. No murmurs, rubs, gallops or clicks. No left DP/PT pulses felt Gastrointestinal system: Abdomen is nondistended, soft and nontender. No organomegaly or masses felt. Normal bowel sounds heard. Central nervous system: Alert and oriented. No focal neurological deficits. Musculoskeletal: No edema. No calf tenderness. Left leg is warmer than previous. Skin: No cyanosis. Psychiatry: Judgement and insight appear normal. Mood & affect appropriate.    Data Reviewed: I have personally reviewed following labs and imaging studies  CBC Lab Results  Component Value Date   WBC 10.2 09/09/2020   RBC 4.30 09/09/2020   HGB 13.4 09/09/2020   HCT 39.4 09/09/2020   MCV 91.6 09/09/2020   MCH 31.2 09/09/2020   PLT 188 09/09/2020    MCHC 34.0 09/09/2020   RDW 17.9 (H) 09/09/2020   LYMPHSABS 1.0 08/29/2020   MONOABS 0.6 08/30/2020   EOSABS 0.0 09/10/2020   BASOSABS 0.0 29/56/2130     Last metabolic panel Lab Results  Component Value Date   NA 137 09/08/2020   K 5.1 09/08/2020   CL 109 09/08/2020   CO2 17 (L) 09/08/2020   BUN 54 (H) 09/08/2020   CREATININE 3.08 (H) 09/08/2020   GLUCOSE 120 (H) 09/08/2020   GFRNONAA 14 (L) 09/08/2020   CALCIUM 8.0 (L) 09/08/2020   PROT 3.5 (L) 09/08/2020   ALBUMIN 3.8 06/21/2020   LABGLOB 2.7 06/21/2020   AGRATIO 1.4 06/21/2020   BILITOT 0.3 06/21/2020   ALKPHOS 86 06/21/2020   AST 22 06/21/2020   ALT 12 06/21/2020   ANIONGAP 11 09/08/2020    CBG (last 3)  No results for input(s): GLUCAP in the last 72 hours.   GFR: Estimated Creatinine Clearance: 10 mL/min (A) (by C-G formula based on SCr of 3.08 mg/dL (H)).  Coagulation Profile: Recent Labs  Lab 09/10/2020 2205  INR 0.9    Recent Results (from the past 240 hour(s))  Resp Panel by RT-PCR (Flu A&B, Covid) Nasopharyngeal Swab     Status: None   Collection Time: 08/30/2020 10:15 PM   Specimen: Nasopharyngeal Swab; Nasopharyngeal(NP) swabs in vial transport medium  Result Value Ref Range Status   SARS Coronavirus 2 by RT PCR NEGATIVE NEGATIVE Final    Comment: (NOTE) SARS-CoV-2 target  nucleic acids are NOT DETECTED.  The SARS-CoV-2 RNA is generally detectable in upper respiratory specimens during the acute phase of infection. The lowest concentration of SARS-CoV-2 viral copies this assay can detect is 138 copies/mL. A negative result does not preclude SARS-Cov-2 infection and should not be used as the sole basis for treatment or other patient management decisions. A negative result may occur with  improper specimen collection/handling, submission of specimen other than nasopharyngeal swab, presence of viral mutation(s) within the areas targeted by this assay, and inadequate number of viral copies(<138  copies/mL). A negative result must be combined with clinical observations, patient history, and epidemiological information. The expected result is Negative.  Fact Sheet for Patients:  EntrepreneurPulse.com.au  Fact Sheet for Healthcare Providers:  IncredibleEmployment.be  This test is no t yet approved or cleared by the [place] FDA and  has been authorized for detection and/or diagnosis of SARS-CoV-2 by FDA under an Emergency Use Authorization (EUA). This EUA will remain  in effect (meaning this test can be used) for the duration of the COVID-19 declaration under Section 564(b)(1) of the Act, 21 U.S.C.section 360bbb-3(b)(1), unless the authorization is terminated  or revoked sooner.       Influenza A by PCR NEGATIVE NEGATIVE Final   Influenza B by PCR NEGATIVE NEGATIVE Final    Comment: (NOTE) The Xpert Xpress SARS-CoV-2/FLU/RSV plus assay is intended as an aid in the diagnosis of influenza from Nasopharyngeal swab specimens and should not be used as a sole basis for treatment. Nasal washings and aspirates are unacceptable for Xpert Xpress SARS-CoV-2/FLU/RSV testing.  Fact Sheet for Patients: EntrepreneurPulse.com.au  Fact Sheet for Healthcare Providers: IncredibleEmployment.be  This test is not yet approved or cleared by the [place] FDA and has been authorized for detection and/or diagnosis of SARS-CoV-2 by FDA under an Emergency Use Authorization (EUA). This EUA will remain in effect (meaning this test can be used) for the duration of the COVID-19 declaration under Section 564(b)(1) of the Act, 21 U.S.C. section 360bbb-3(b)(1), unless the authorization is terminated or revoked.  Performed at [facility], [place] [address] [place], [place] [address]   Surgical pcr screen     Status: None   Collection Time: 09/05/20  6:59 PM   Specimen: Nasal Mucosa; Nasal Swab  Result Value Ref  Range Status   MRSA, PCR NEGATIVE NEGATIVE Final   Staphylococcus aureus NEGATIVE NEGATIVE Final    Comment: (NOTE) The Xpert SA Assay (FDA approved for NASAL specimens in patients 50 years of age and older), is one component of a comprehensive surveillance program. It is not intended to diagnose infection nor to guide or monitor treatment. Performed at [facility], [place] [address], [place], [place] [address]   Culture, body fluid w Gram Stain-bottle     Status: None (Preliminary result)   Collection Time: 09/08/20  3:45 PM   Specimen: Pleura  Result Value Ref Range Status   Specimen Description PLEURAL FLUID  Final   Special Requests NONE  Final   Culture   Final    NO GROWTH < 24 HOURS Performed at [facility], [place] [address], [place], [place] [address]    Report Status PENDING  Incomplete        Radiology Studies: DG CHEST PORT 1 VIEW  Result Date: 09/09/2020 CLINICAL DATA:  Short of breath, wheezing EXAM: PORTABLE CHEST 1 VIEW COMPARISON:  09/08/2020 FINDINGS: Single frontal view of the chest demonstrates a stable cardiac silhouette. Persistent bibasilar  consolidation and effusions, without change since prior study. No pneumothorax. No acute bony abnormalities. IMPRESSION: 1. Stable bibasilar consolidation and bilateral effusions. Electronically Signed   By: Randa Ngo M.D.   On: 09/09/2020 02:49   DG CHEST PORT 1 VIEW  Result Date: 09/08/2020 CLINICAL DATA:  Follow-up pneumothorax. EXAM: PORTABLE CHEST 1 VIEW COMPARISON:  09/08/2020. FINDINGS: The tiny right apical pneumothorax noted on the earlier exam is not visualized on the current study. Persistent opacity at both lung bases consistent with a combination of pleural fluid and atelectasis and/or infiltrate. No new lung abnormalities. Cardiac silhouette is mildly enlarged. No mediastinal or hilar masses. IMPRESSION: 1. No current evidence of a pneumothorax. 2. Persistent lung base opacities consistent with  pleural effusions with associated atelectasis and/or infiltrate. Electronically Signed   By: Lajean Manes M.D.   On: 09/08/2020 19:14   DG Chest Port 1 View  Result Date: 09/08/2020 CLINICAL DATA:  86 year old female status post right-sided thoracentesis. EXAM: PORTABLE CHEST 1 VIEW COMPARISON:  Chest radiograph dated 09/10/2020. FINDINGS: Small bilateral pleural effusions with associated bibasilar atelectasis. Slight improvement in aeration of the right lung base compared to the prior radiograph. Faint linear density in the right apex may be artifactual or represent a tiny pneumothorax measuring approximately 5 mm in thickness. Follow-up recommended. Stable cardiomegaly. Atherosclerotic calcification of the aorta. No acute osseous pathology. Degenerative changes of spine. IMPRESSION: 1. Small bilateral pleural effusions with associated bibasilar atelectasis. Slight improvement in aeration of the right lung base compared to the prior radiograph. 2. Possible tiny right apical pneumothorax. Follow-up recommended. Electronically Signed   By: Anner Crete M.D.   On: 09/08/2020 15:20   ECHOCARDIOGRAM COMPLETE  Result Date: 08/21/2020    ECHOCARDIOGRAM REPORT   Patient Name:   JELESA MANGINI Date of Exam: 08/14/2020 Medical Rec #:  [id number]   Height:       68.0 in Accession #:    [id number]  Weight:       101.0 lb Date of Birth:  06-26-1934    BSA:          1.529 m Patient Age:    87 years    BP:           182/43 mmHg Patient Gender: F           HR:           77 bpm. Exam Location:  Inpatient Procedure: 2D Echo, Cardiac Doppler and Color Doppler Indications:    Cardiomegaly  History:        Patient has no prior history of Echocardiogram examinations. PAD                 and COPD; Risk Factors:Hypertension, Dyslipidemia and Current                 Smoker. Left hip fracture, pleural effusion.  Sonographer:    Dustin Flock Referring Phys: [phone] [place]  1. Left ventricular ejection fraction,  by estimation, is 60 to 65%. The left ventricle has normal function. The left ventricle has no regional wall motion abnormalities. There is mild left ventricular hypertrophy. Left ventricular diastolic parameters are consistent with Grade I diastolic dysfunction (impaired relaxation). Elevated left atrial pressure.  2. Right ventricular systolic function is normal. The right ventricular size is normal. There is moderately elevated pulmonary artery systolic pressure.  3. Left atrial size was mildly dilated.  4. A small pericardial effusion is present. Moderate pleural effusion in the left lateral  region.  5. The mitral valve is normal in structure. Mild mitral valve regurgitation. No evidence of mitral stenosis.  6. The aortic valve is normal in structure. Aortic valve regurgitation is mild. No aortic stenosis is present.  7. The inferior vena cava is normal in size with greater than 50% respiratory variability, suggesting right atrial pressure of 3 mmHg. FINDINGS  Left Ventricle: Left ventricular ejection fraction, by estimation, is 60 to 65%. The left ventricle has normal function. The left ventricle has no regional wall motion abnormalities. The left ventricular internal cavity size was normal in size. There is  mild left ventricular hypertrophy. Left ventricular diastolic parameters are consistent with Grade I diastolic dysfunction (impaired relaxation). Elevated left atrial pressure. Right Ventricle: The right ventricular size is normal.Right ventricular systolic function is normal. There is moderately elevated pulmonary artery systolic pressure. The tricuspid regurgitant velocity is 3.65 m/s, and with an assumed right atrial pressure of 3 mmHg, the estimated right ventricular systolic pressure is 43.3 mmHg. Left Atrium: Left atrial size was mildly dilated. Right Atrium: Right atrial size was normal in size. Pericardium: A small pericardial effusion is present. Mitral Valve: The mitral valve is normal in  structure. Mild mitral valve regurgitation. No evidence of mitral valve stenosis. Tricuspid Valve: The tricuspid valve is normal in structure. Tricuspid valve regurgitation is mild . No evidence of tricuspid stenosis. Aortic Valve: The aortic valve is normal in structure. Aortic valve regurgitation is mild. Aortic regurgitation PHT measures 379 msec. No aortic stenosis is present. Pulmonic Valve: The pulmonic valve was normal in structure. Pulmonic valve regurgitation is not visualized. No evidence of pulmonic stenosis. Aorta: The aortic root is normal in size and structure. Venous: The inferior vena cava is normal in size with greater than 50% respiratory variability, suggesting right atrial pressure of 3 mmHg. IAS/Shunts: No atrial level shunt detected by color flow Doppler. Additional Comments: There is a moderate pleural effusion in the left lateral region.  LEFT VENTRICLE PLAX 2D LVIDd:         3.80 cm  Diastology LVIDs:         2.80 cm  LV e' medial:    5.98 cm/s LV PW:         1.20 cm  LV E/e' medial:  16.1 LV IVS:        1.30 cm  LV e' lateral:   9.25 cm/s LVOT diam:     2.10 cm  LV E/e' lateral: 10.4 LV SV:         81 LV SV Index:   53 LVOT Area:     3.46 cm  RIGHT VENTRICLE RV Basal diam:  2.20 cm RV S prime:     16.40 cm/s TAPSE (M-mode): 2.8 cm LEFT ATRIUM             Index       RIGHT ATRIUM           Index LA diam:        2.90 cm 1.90 cm/m  RA Area:     11.90 cm LA Vol (A2C):   28.0 ml 18.32 ml/m RA Volume:   27.60 ml  18.05 ml/m LA Vol (A4C):   62.0 ml 40.56 ml/m LA Biplane Vol: 43.8 ml 28.65 ml/m  AORTIC VALVE LVOT Vmax:   107.00 cm/s LVOT Vmean:  74.200 cm/s LVOT VTI:    0.235 m AI PHT:      379 msec  AORTA Ao Root diam: 3.10 cm MITRAL VALVE  TRICUSPID VALVE MV Area (PHT): 3.85 cm     TR Peak grad:   53.3 mmHg MV Decel Time: 197 msec     TR Vmax:        365.00 cm/s MV E velocity: 96.30 cm/s MV A velocity: 135.00 cm/s  SHUNTS MV E/A ratio:  0.71         Systemic VTI:  0.24 m                              Systemic Diam: 2.10 cm Kirk Ruths MD Electronically signed by Kirk Ruths MD Signature Date/Time: 08/12/2020/3:24:08 PM    Final    VAS [place] LOWER EXTREMITY ARTERIAL DUPLEX  Result Date: 09/08/2020 LOWER EXTREMITY ARTERIAL DUPLEX STUDY Patient Name:  DANENE MONTIJO  Date of Exam:   09/05/2020 Medical Rec #: [id number]    Accession #:    [id number] Date of Birth: 06-Jul-1934     Patient Gender: F Patient Age:   39Y Exam Location:  [facility] Procedure:      VAS [place] LOWER EXTREMITY ARTERIAL DUPLEX Referring Phys: [id number] [place] --------------------------------------------------------------------------------  Indications: Ischemic, Cold, mottled foot. History of PAD with multiple              interventions at outside facility. High Risk Factors: Hypertension, current smoker. Other Factors: Hip fracture5/25/22 with repair 09/05/2020.  Current ABI: n/a Comparison Study: No prior study Performing Technologist: Sharion Dove RVS  Examination Guidelines: A complete evaluation includes B-mode imaging, spectral Doppler, color Doppler, and power Doppler as needed of all accessible portions of each vessel. Bilateral testing is considered an integral part of a complete examination. Limited examinations for reoccurring indications may be performed as noted.  +----------+--------+-----+---------------+------------------+-----------------+ LEFT      PSV cm/sRatioStenosis       Waveform          Comments          +----------+--------+-----+---------------+------------------+-----------------+ CFA Prox  29                          monophasic                          +----------+--------+-----+---------------+------------------+-----------------+ SFA Prox  140          30-49% stenosismonophasic        stenosis based on                                                         plaque formation   +----------+--------+-----+---------------+------------------+-----------------+ SFA Mid   11                          monophasic        areas of                                                                  occlusion noted  in the mid SFA    +----------+--------+-----+---------------+------------------+-----------------+ SFA Distal15                          monophasic        areas of                                                                  occlusion noted                                                           in the distal SFA +----------+--------+-----+---------------+------------------+-----------------+ POP Prox  15                          dampened                                                                  monophasic                          +----------+--------+-----+---------------+------------------+-----------------+ POP Mid   36                          dampened                                                                  monophasic                          +----------+--------+-----+---------------+------------------+-----------------+ POP Distal13                          dampened                                                                  monophasic                          +----------+--------+-----+---------------+------------------+-----------------+ ATA Prox               occluded                                           +----------+--------+-----+---------------+------------------+-----------------+  ATA Mid                occluded                                           +----------+--------+-----+---------------+------------------+-----------------+ ATA Distal             occluded                                           +----------+--------+-----+---------------+------------------+-----------------+ PTA  Prox               occluded                                           +----------+--------+-----+---------------+------------------+-----------------+ PTA Mid                occluded                                           +----------+--------+-----+---------------+------------------+-----------------+ PTA Distal             occluded                                           +----------+--------+-----+---------------+------------------+-----------------+  Summary: Left: 30-49% stenosis noted in the mid superficial femoral artery. Areas of occlusion noted in the mid and distal SFA. The ATA and PTA appear occluded. Significant, diffuse, calcific plaque noted throughout the left lower extremity.  See table(s) above for measurements and observations. Electronically signed by Monica Martinez MD on 09/08/2020 at 10:16:53 AM.    Final    HYBRID OR IMAGING ([place])  Result Date: 08/11/2020 There is no interpretation for this exam.  This order is for images obtained during a surgical procedure.  Please See "Surgeries" Tab for more information regarding the procedure.   US THORACENTESIS ASP PLEURAL SPACE W/IMG GUIDE  Result Date: 09/08/2020 INDICATION: Patient history of breast cancer, tobacco use, chronic kidney disease. Found to have pleural effusion. Request is for therapeutic and diagnostic thoracentesis EXAM: ULTRASOUND GUIDED THERAPEUTIC AND DIAGNOSTIC RIGHT-SIDED THORACENTESIS MEDICATIONS: LIDOCAINE 1% 10 ML COMPLICATIONS: SIR Level A - No therapy, no consequence.  Tiny pneumothorax. PROCEDURE: An ultrasound guided thoracentesis was thoroughly discussed with the patient and questions answered. The benefits, risks, alternatives and complications were also discussed. The patient understands and wishes to proceed with the procedure. Written consent was obtained. Ultrasound was performed to localize and mark an adequate pocket of fluid in the right chest. The area was then prepped and draped  in the normal sterile fashion. 1% Lidocaine was used for local anesthesia. Under ultrasound guidance a 6 Fr Safe-T-Centesis catheter was introduced. Thoracentesis was performed. The catheter was removed and a dressing applied. FINDINGS: A total of approximately 650 mL of straw-colored fluid was removed. Samples were sent to the laboratory as requested by the clinical team. IMPRESSION: Successful ultrasound guided therapeutic and diagnostic right sided thoracentesis yielding 650 mL of pleural fluid. Read by: Rushie Nyhan,  NP Electronically Signed   By: Ruthann Cancer MD   On: 09/08/2020 15:46        Scheduled Meds: . sodium chloride   Intravenous Once  . sodium chloride   Intravenous Once  . chlorhexidine  15 mL Mouth/Throat Once  . Chlorhexidine Gluconate Cloth  6 each Topical Daily  . clopidogrel  75 mg Oral Daily  . diltiazem  120 mg Oral BID  . docusate sodium  100 mg Oral BID  . feeding supplement  237 mL Oral TID BM  . gabapentin  100 mg Oral QHS  . hydrALAZINE  50 mg Oral Q8H  . multivitamin with minerals  1 tablet Oral Daily  . polyethylene glycol  17 g Oral Daily  . simvastatin  20 mg Oral q1800   Continuous Infusions: . sodium chloride    . methocarbamol (ROBAXIN) IV       LOS: 4 days     Cordelia Poche, MD [facility] 09/09/2020, 10:34 AM  If 7PM-7AM, please contact night-coverage [email or web address]

## 2020-09-09 NOTE — Progress Notes (Signed)
Pt has not voided since foley came out. Pt stated she doesn't feel like she has to urinate, no pressure or discomfort. Bladder scan showed 39 cc.  Left groin incision site was oozing serosanguinous drainage, cleaned with NS. 4x4 gauze and medipore tape applied. Left upper arm where Iv had infiltrated is swollen, and oozing at the insertion, gauze and tape applied. Elevated in pillow, will continue to monitor.

## 2020-09-09 NOTE — Progress Notes (Signed)
Occupational Therapy Treatment Patient Details Name: Andrea Shepherd MRN: [id number] DOB: [birth year] Today's Date: 09/09/2020    History of present illness 86 y.o. F admitted to [facility] on 5/25 due to a fall, resulting in sharp pain in LLE and L hand. [name] showed an acute fracture of distal left fifth metacarpal diaphysis and a closed L hip fx. On 5/27  L Hip IM Nail. 5/28 left common femoral endarterectomy. 5/29 thoracentesis. PMHx: PAD, hypertension, chronic kidney disease stage IV, and current smoker.   OT comments  Pt received on BSC attempting to have BM though unsuccessful. Pt perseverating on texture of BSC armrests, believing this was hindering ability to scoot/manuver on BSC. Ultimately, pt required Total A x 2 for peri care and squat pivot back to bed for preveena placement. Pt with minimal muscle activation to assist with tasks. Educated to continue TWB to L LE until clarification received from MD. Continue to recommend SNF at DC.    Follow Up Recommendations  SNF;Supervision/Assistance - 24 hour    Equipment Recommendations  3 in 1 bedside commode;Tub/shower bench;Other (comment) (RW)    Recommendations for Other Services      Precautions / Restrictions Precautions Precautions: Fall Required Braces or Orthoses: Splint/Cast Splint/Cast: splint left hand Restrictions Weight Bearing Restrictions: Yes LUE Weight Bearing: Weight bear through elbow only LLE Weight Bearing: Touchdown weight bearing Other Position/Activity Restrictions: TWB per order set, WBAT per op note for L LE - awaiting clarification       Mobility Bed Mobility Overal bed mobility: Needs Assistance Bed Mobility: Sit to Supine     Supine to sit: Mod assist;+2 for physical assistance Sit to supine: Max assist   General bed mobility comments: Max A to get back in bed, able to assist with trunk but minimal initiation of LE movements    Transfers Overall transfer level: Needs assistance Equipment used: 2  person hand held assist Transfers: Squat Pivot Transfers     Squat pivot transfers: Total assist;+2 physical assistance;+2 safety/equipment     General transfer comment: Pt with no initiation to assist with task. After squat pivot back to bed, pt reports "well i didnt know that I was supposed to use my good leg" to assist with transfer    Balance Overall balance assessment: Needs assistance Sitting-balance support: Bilateral upper extremity supported;Feet supported Sitting balance-Leahy Scale: Fair     Standing balance support: Bilateral upper extremity supported Standing balance-Leahy Scale: Poor Standing balance comment: physical assist for standing to maintain precautions and balance                           ADL either performed or assessed with clinical judgement   ADL Overall ADL's : Needs assistance/impaired                         Toilet Transfer: Total assistance;+2 for physical assistance;+2 for safety/equipment;Squat-pivot;BSC Toilet Transfer Details (indicate cue type and reason): from [facility] to bed, minimal initiation Toileting- Clothing Manipulation and Hygiene: Total assistance;+2 for physical assistance;+2 for safety/equipment;Sit to/from stand Toileting - Clothing Manipulation Details (indicate cue type and reason): self limiting, attempted t o educate on scooting forward or leaning side to side to scoot hips forward with pt perseverating on texture of BSC armrest (requesting towel to put over them)       General ADL Comments: Intermittent comments related to agitation, poor initiation of tasks with self limiting behavior  Vision   Vision Assessment?: No apparent visual deficits   Perception     Praxis      Cognition Arousal/Alertness: Awake/alert Behavior During Therapy: Flat affect Overall Cognitive Status: Impaired/Different from baseline Area of Impairment: Attention;Orientation;Memory;Safety/judgement                  Orientation Level: Disoriented to;Time Current Attention Level: Sustained Memory: Decreased short-term memory   Safety/Judgement: Decreased awareness of deficits     General Comments: Pt with decreased recall of precautions, poor problem solving and noted with agitation. Decreased receptiveness to education/safety techniques        Exercises     Shoulder Instructions       General Comments      Pertinent Vitals/ Pain       Pain Assessment: Faces Faces Pain Scale: Hurts little more Pain Location: LLE Pain Descriptors / Indicators: Aching;Grimacing;Discomfort Pain Intervention(s): Monitored during session;Limited activity within patient's tolerance  Home Living                                          Prior Functioning/Environment              Frequency  Min 2X/week        Progress Toward Goals  OT Goals(current goals can now be found in the care plan section)  Progress towards OT goals: OT to reassess next treatment  Acute Rehab OT Goals Patient Stated Goal: To go home OT Goal Formulation: With patient Time For Goal Achievement: 09/21/20 Potential to Achieve Goals: Good ADL Goals Pt Will Perform Lower Body Dressing: with set-up;with adaptive equipment;sit to/from stand;sitting/lateral leans Pt Will Transfer to Toilet: with modified independence;stand pivot transfer;bedside commode Pt Will Perform Toileting - Clothing Manipulation and hygiene: with supervision;sitting/lateral leans;sit to/from stand Additional ADL Goal #1: Pt will tolerate sitting EOB with no support for 5 mins to complete seated ADL's Additional ADL Goal #2: Pt will verbalize 3 fall prevention techniques  Plan Discharge plan remains appropriate    Co-evaluation                 AM-PAC OT "6 Clicks" Daily Activity     Outcome Measure   Help from another person eating meals?: A Little Help from another person taking care of personal grooming?: A Little Help  from another person toileting, which includes using toliet, bedpan, or urinal?: Total Help from another person bathing (including washing, rinsing, drying)?: A Lot Help from another person to put on and taking off regular upper body clothing?: A Little Help from another person to put on and taking off regular lower body clothing?: A Lot 6 Click Score: 14    End of Session Equipment Utilized During Treatment: Gait belt  OT Visit Diagnosis: Unsteadiness on feet (R26.81);Muscle weakness (generalized) (M62.81);History of falling (Z91.81)   Activity Tolerance Treatment limited secondary to agitation   Patient Left in bed;with call bell/phone within reach;with bed alarm set;with nursing/sitter in room   Nurse Communication Mobility status        Time: 8185-6314 OT Time Calculation (min): 25 min  Charges: OT General Charges $OT Visit: 1 Visit OT Treatments $Self Care/Home Management : 8-22 mins $Therapeutic Activity: 8-22 mins  Malachy Chamber, OTR/L Acute Rehab Services Office: [place] 09/09/2020, 11:33 AM

## 2020-09-09 NOTE — Progress Notes (Addendum)
VASCULAR SURGERY ASSESSMENT & PLAN:   2 Days Post-Op Acute on chronic ischemia of left lower extremity: status post redo left common femoral endarterectomy with profundoplasty and bovine pericardial patch angioplasty including thrombectomy of the distal left external iliac stent. VSS. Afebrile. Dampened monophasic left PT Doppler signal with stable mottling of LLE from mid-calf to foot. Clear drainage from left groin incision>consider placing Prevena dressing. Continue Plavix and aspirin.   SUBJECTIVE:   She states much improvement in LLE pain with movement today and denies left foot pain.  PHYSICAL EXAM:   Vitals:   09/09/20 0124 09/09/20 0342 09/09/20 0700 09/09/20 0719  BP: (!) 165/50 (!) 171/60  (!) 163/52  Pulse: 79 76 71 63  Resp: 20 17  16   Temp:  98.1 F (36.7 C) 98 F (36.7 C) 98 F (36.7 C)  TempSrc:  Oral Oral Oral  SpO2: 96% 96% 96% 96%  Weight:      Height:       General appearance: Awake, alert in no apparent distress Cardiac: Heart rate and rhythm are regular Respirations: Nonlabored Incisions: Left groin incision is well approximated with clear blood-tinged drainage. No erythema or hematoma. Right groin puncture site without hematoma Extremities: Left foot, ankle and mid-lower are mottled, cool to touch with decreased touch sensation. Able to wiggle left toes. Right foot is warm with intact sensation and motor function.  Pulse/Doppler exam:  Dampened left posterior tibial and brisk biphasic right PT Doppler signals. 2+ bilateral femoral artery pulses.      LABS:   Lab Results  Component Value Date   WBC 10.2 09/09/2020   HGB 13.4 09/09/2020   HCT 39.4 09/09/2020   MCV 91.6 09/09/2020   PLT 188 09/09/2020   Lab Results  Component Value Date   CREATININE 3.08 (H) 09/08/2020   Lab Results  Component Value Date   INR 0.9 08/23/2020   CBG (last 3)  No results for input(s): GLUCAP in the last 72 hours.  PROBLEM LIST:    Principal Problem:    Closed left hip fracture (HCC) Active Problems:   CKD (chronic kidney disease), stage IV (HCC)   Peripheral arterial occlusive disease (HCC)   Hypertensive urgency   Hypokalemia   Normocytic anemia   Closed fracture of 5th metacarpal   Pleural effusion, bilateral   Status post surgery   CURRENT MEDS:   . sodium chloride   Intravenous Once  . sodium chloride   Intravenous Once  . albuterol  2.5 mg Nebulization Once  . chlorhexidine  15 mL Mouth/Throat Once  . Chlorhexidine Gluconate Cloth  6 each Topical Daily  . clopidogrel  75 mg Oral Daily  . diltiazem  120 mg Oral BID  . docusate sodium  100 mg Oral BID  . feeding supplement  237 mL Oral TID BM  . gabapentin  100 mg Oral QHS  . hydrALAZINE  50 mg Oral Q8H  . multivitamin with minerals  1 tablet Oral Daily  . polyethylene glycol  17 g Oral Daily  . simvastatin  20 mg Oral [address]    Barbie Banner, [place] Office: [phone] 09/09/2020   I have seen and evaluated the patient. I agree with the PA note as documented above.  Postop day 2 status post thrombectomy of left external iliac stent with redo left common femoral endartarterectomy and profundoplasty for acute on chronic ischemia of the left leg.  She has some serous drainage from the left groin today and will place a [place]  VAC dressing.  Still has a palpable femoral pulse.  Has a monophasic PT signal at the ankle.  She says her foot feels better.  Still has mottling in the foot itself.  I discussed with her she has no further options for revascularization and would ultimately require amputation if this progresses.  Working with therapy today.  Marty Heck, MD [facility] [place] Office: [phone]

## 2020-09-09 NOTE — Evaluation (Signed)
Clinical/Bedside Swallow Evaluation Patient Details  Name: Andrea Shepherd MRN: [id number] Date of Birth: 1934/09/25  Today's Date: 09/09/2020 Time: SLP Start Time (ACUTE ONLY): 1412 SLP Stop Time (ACUTE ONLY): 1442 SLP Time Calculation (min) (ACUTE ONLY): 29.88 min  Past Medical History:  Past Medical History:  Diagnosis Date  . Age-related osteoporosis without current pathological fracture   . Essential (primary) hypertension   . History of malignant neoplasm of breast   . Impaired fasting glucose   . Mixed hyperlipidemia   . Occlusion and stenosis of bilateral carotid arteries    Past Surgical History:  Past Surgical History:  Procedure Laterality Date  . BREAST LUMPECTOMY  2010   right lower  . CAROTID ENDARTERECTOMY     Left  . CATARACT EXTRACTION Right 2016  . FEMORAL ARTERY STENT Right    04/2015  . FEMORAL BYPASS Bilateral    Left done 06/25/2015 and right done 07/2015  . left tumor removed from neck     HPI:  86 y.o. F admitted to [facility] on 5/25 due to a fall, resulting in sharp pain in LLE and L hand. [name] showed an acute fracture of distal left fifth metacarpal diaphysis and a closed L hip fx. On 5/27  L Hip IM Nail. 5/28 left common femoral endarterectomy. 5/29 thoracentesis. PMHx: PAD, hypertension, chronic kidney disease stage IV, and current smoker   Assessment / Plan / Recommendation Clinical Impression  Pt presented with functional swallowing with no concerns for dysphagia.  She had intermittent confusion with difficulty answering questions and relaying history.  She demonstrated adequate mastication of solids, brisk swallow response, no s/s of aspiration.  She complained about not being able to get lettuce/tomato for her burger (due to being on a soft diet). There were no overt difficulties noted. Recommend allowing regular solids to expand options; thin liquids.  If pt continues to have trouble swallowing pills, crush or give whole in puree. No further needs identified  - SLP to sign off. SLP Visit Diagnosis: Dysphagia, unspecified (R13.10)    Aspiration Risk  No limitations    Diet Recommendation   regular solids, thin liquids  Medication Administration: Whole meds with puree    Other  Recommendations Oral Care Recommendations: Oral care BID   Follow up Recommendations None      Frequency and Duration            Prognosis        Swallow Study   General HPI: 86 y.o. F admitted to [facility] on 5/25 due to a fall, resulting in sharp pain in LLE and L hand. [name] showed an acute fracture of distal left fifth metacarpal diaphysis and a closed L hip fx. On 5/27  L Hip IM Nail. 5/28 left common femoral endarterectomy. 5/29 thoracentesis. PMHx: PAD, hypertension, chronic kidney disease stage IV, and current smoker Type of Study: Bedside Swallow Evaluation Previous Swallow Assessment: no Diet Prior to this Study:  (soft) Temperature Spikes Noted: No Respiratory Status: Room air History of Recent Intubation: No Behavior/Cognition: Alert;Cooperative;Pleasant mood Oral Cavity Assessment: Within Functional Limits Oral Care Completed by SLP: Recent completion by staff Oral Cavity - Dentition: Dentures, top;Dentures, bottom Vision: Functional for self-feeding Self-Feeding Abilities: Able to feed self Patient Positioning: Upright in bed Baseline Vocal Quality: Normal Volitional Cough: Strong Volitional Swallow: Able to elicit    Oral/Motor/Sensory Function Overall Oral Motor/Sensory Function: Within functional limits   Ice Chips Ice chips: Within functional limits   Thin Liquid Thin Liquid: Within functional limits  Nectar Thick Nectar Thick Liquid: Not tested   Honey Thick Honey Thick Liquid: Not tested   Puree Puree: Within functional limits   Solid     Solid: Within functional limits      Juan Quam Laurice 09/09/2020,2:44 PM  Estill Bamberg L. Tivis Ringer, [place] Office number [phone] Pager  [phone]

## 2020-09-09 NOTE — Plan of Care (Signed)

## 2020-09-10 ENCOUNTER — Encounter (HOSPITAL_COMMUNITY): Payer: Self-pay | Admitting: Vascular Surgery

## 2020-09-10 ENCOUNTER — Inpatient Hospital Stay (HOSPITAL_COMMUNITY): Payer: Medicare Other

## 2020-09-10 DIAGNOSIS — N184 Chronic kidney disease, stage 4 (severe): Secondary | ICD-10-CM | POA: Diagnosis not present

## 2020-09-10 DIAGNOSIS — E876 Hypokalemia: Secondary | ICD-10-CM | POA: Diagnosis not present

## 2020-09-10 DIAGNOSIS — S72002A Fracture of unspecified part of neck of left femur, initial encounter for closed fracture: Secondary | ICD-10-CM | POA: Diagnosis not present

## 2020-09-10 DIAGNOSIS — N179 Acute kidney failure, unspecified: Secondary | ICD-10-CM

## 2020-09-10 DIAGNOSIS — I160 Hypertensive urgency: Secondary | ICD-10-CM | POA: Diagnosis not present

## 2020-09-10 LAB — BASIC METABOLIC PANEL
Anion gap: 11 (ref 5–15)
Anion gap: 12 (ref 5–15)
BUN: 71 mg/dL — ABNORMAL HIGH (ref 8–23)
BUN: 70 mg/dL — ABNORMAL HIGH (ref 8–23)
CO2: 16 mmol/L — ABNORMAL LOW (ref 22–32)
CO2: 19 mmol/L — ABNORMAL LOW (ref 22–32)
Calcium: 8.1 mg/dL — ABNORMAL LOW (ref 8.9–10.3)
Calcium: 8.3 mg/dL — ABNORMAL LOW (ref 8.9–10.3)
Chloride: 109 mmol/L (ref 98–111)
Chloride: 108 mmol/L (ref 98–111)
Creatinine, Ser: 3.7 mg/dL — ABNORMAL HIGH (ref 0.44–1.00)
Creatinine, Ser: 3.76 mg/dL — ABNORMAL HIGH (ref 0.44–1.00)
GFR, Estimated: 12 mL/min — ABNORMAL LOW (ref >60)
GFR, Estimated: 11 mL/min — ABNORMAL LOW (ref >60)
Glucose, Bld: 105 mg/dL — ABNORMAL HIGH (ref 70–99)
Glucose, Bld: 114 mg/dL — ABNORMAL HIGH (ref 70–99)
Potassium: 5.7 mmol/L — ABNORMAL HIGH (ref 3.5–5.1)
Potassium: 5.2 mmol/L — ABNORMAL HIGH (ref 3.5–5.1)
Sodium: 136 mmol/L (ref 135–145)
Sodium: 139 mmol/L (ref 135–145)

## 2020-09-10 LAB — SODIUM, URINE, RANDOM: Sodium, Ur: 15 mmol/L

## 2020-09-10 LAB — CYTOLOGY - NON PAP

## 2020-09-10 LAB — URINALYSIS, ROUTINE W REFLEX MICROSCOPIC
Bilirubin Urine: NEGATIVE
Glucose, UA: NEGATIVE mg/dL
Ketones, ur: NEGATIVE mg/dL
Nitrite: NEGATIVE
Protein, ur: >=300 mg/dL — AB
Specific Gravity, Urine: 1.018 (ref 1.005–1.030)
pH: 5 (ref 5.0–8.0)

## 2020-09-10 LAB — POTASSIUM: Potassium: 5.4 mmol/L — ABNORMAL HIGH (ref 3.5–5.1)

## 2020-09-10 LAB — CREATININE, URINE, RANDOM: Creatinine, Urine: 85.99 mg/dL

## 2020-09-10 MED ORDER — SODIUM ZIRCONIUM CYCLOSILICATE 5 G PO PACK
5.0000 g | PACK | Freq: Once | ORAL | Status: AC
  Administered 2020-09-10: 5 g via ORAL
  Filled 2020-09-10: qty 1

## 2020-09-10 MED ORDER — SODIUM BICARBONATE 8.4 % IV SOLN
50.0000 meq | Freq: Once | INTRAVENOUS | Status: AC
  Administered 2020-09-10: 50 meq via INTRAVENOUS
  Filled 2020-09-10: qty 50

## 2020-09-10 MED ORDER — ATORVASTATIN CALCIUM 40 MG PO TABS
40.0000 mg | ORAL_TABLET | Freq: Every day | ORAL | Status: DC
  Administered 2020-09-10 – 2020-09-11 (×2): 40 mg via ORAL
  Filled 2020-09-10 (×2): qty 1

## 2020-09-10 MED ORDER — SODIUM BICARBONATE 8.4 % IV SOLN
INTRAVENOUS | Status: DC
  Filled 2020-09-10 (×4): qty 1000

## 2020-09-10 MED ORDER — PATIROMER SORBITEX CALCIUM 8.4 G PO PACK
16.8000 g | PACK | Freq: Once | ORAL | Status: AC
  Administered 2020-09-10: 16.8 g via ORAL
  Filled 2020-09-10: qty 2

## 2020-09-10 NOTE — Progress Notes (Signed)
   Patient griminess to passive movement of the left foot when removing her sock. No signals attainable with doppler and left foot is cool to touch.   Prevena vac in place left groin secondary to drainage.   A/P: Acute on chronic ischemia of left lower extremity Now with no doppler signals Will place NPO order for possible intervention today  Roxy Horseman PA-C

## 2020-09-10 NOTE — Progress Notes (Signed)
[facility] [place]  Subjective  -seems pretty sedated today.  Grimaces when you touch her left foot.  Color is better from the mottling over the weekend.   Objective (!) 194/39 78 97.9 F (36.6 C) (Oral) 20 95%  Intake/Output Summary (Last 24 hours) at 09/10/2020 0808 Last data filed at 09/10/2020 8416 Gross per 24 hour  Intake 1329.21 ml  Output 180 ml  Net 1149.21 ml    Left groin with Prevena VAC. No signals in the left foot but color is better from the mottling over the weekend.  Laboratory Lab Results: Recent Labs    09/08/20 0500 09/09/20 0228  WBC 7.6 10.2  HGB 13.1 13.4  HCT 38.5 39.4  PLT 171 188   BMET Recent Labs    09/09/20 1143 09/10/20 0108  NA 137 136  K 5.3* 5.7*  CL 110 109  CO2 19* 16*  GLUCOSE 101* 105*  BUN 65* 71*  CREATININE 3.51* 3.70*  CALCIUM 7.9* 8.1*    COAG Lab Results  Component Value Date   INR 0.9 09/02/2020   No results found for: PTT  Assessment/Planning:  86 year old female that I was consulted on Saturday evening with ischemic left lower extremity.  She had profound mottling up to the knee and at the time stated she had had a motor and sensory deficit since Wednesday at the time of her fall.  She had been admitted for hip fracture repair with orthopedic surgery.  She had a history of bilateral iliac stenting and bilateral femoral endarterectomy in 2017 at [facility] and had no follow-up with her vascular surgeon at [facility] since 2018 - last duplex in 2018 showed her profunda occluded.   She was taken to the OR Saturday night and underwent thrombectomy of her left iliac stent on the left as well as redo common femoral endarterectomy and profundoplasty with bovine pericardial patch.  Angiogram was obtained at the time and she really had no significant runoff below the knee.  She has no further options for revascularization.  I cannot find a signal in her left foot this morning and it is hard to  know how much pain she is having given she seems pretty sedated.  Her only option moving forward will be a left above-knee amputation from my standpoint if her pain progresses.  No plans for surgery today.  Marty Heck 09/10/2020 8:08 AM --

## 2020-09-10 NOTE — Progress Notes (Signed)
   09/10/20 6151  Notify: Provider  Provider Name/Title Mansy MD  Date Provider Notified 09/10/20  Time Provider Notified [phone]  Notification Type Page Shea Evans)  Notification Reason  (Total urine output on this shift was 80cc, blader scan shows 172cc. Patient reeceving normal saline at 32mls/hr)

## 2020-09-10 NOTE — Progress Notes (Signed)
PHARMACIST LIPID MONITORING   Andrea Shepherd is a 86 y.o. female admitted on 08/25/2020 with PAD.  Pharmacy has been consulted to optimize lipid-lowering therapy with the indication of secondary prevention for clinical ASCVD.  Recent Labs:  Lipid Panel (last 6 months):   Lab Results  Component Value Date   CHOL 139 09/08/2020   TRIG 97 09/08/2020   HDL 73 09/08/2020   CHOLHDL 1.9 09/08/2020   VLDL 19 09/08/2020   LDLCALC 47 09/08/2020    Hepatic function panel (last 6 months):   Lab Results  Component Value Date   AST 22 06/21/2020   ALT 12 06/21/2020   ALKPHOS 86 06/21/2020   BILITOT 0.3 06/21/2020    SCr (since admission):   Serum creatinine: 3.7 mg/dL (H) 09/10/20 0108 Estimated creatinine clearance: 8.3 mL/min (A)  Current therapy and lipid therapy tolerance Current lipid-lowering therapy: simvastatin Documented or reported allergies or intolerances to lipid-lowering therapies (if applicable): none  Plan:    1.Statin intensity (high intensity recommended for all patients regardless of the LDL):  Add or increase statin to high intensity.  2.Add ezetimibe (if any one of the following):   Not indicated at this time.  3.Refer to lipid clinic:   No  4.Follow-up with:  Primary care provider - Rochel Brome, MD  5.Follow-up labs after discharge:   -LDL at goal; assess lipid panel in 1 year  Hildred Laser, PharmD Clinical Pharmacist **Pharmacist phone directory can now be found on [email or web address] ([email or web address]).  Listed under [place].

## 2020-09-10 NOTE — Progress Notes (Signed)
   09/10/20 3335  Notify: Provider  Provider Name/Title Mansy MD  Date Provider Notified 09/10/20  Time Provider Notified [phone]  Notification Type Page Shea Evans)  Notification Reason Other (Comment) (potassium level 5.7)  Provider response See new orders  Date of Provider Response 09/10/20  Time of Provider Response [phone]

## 2020-09-10 NOTE — TOC Progression Note (Signed)
Transition of Care [facility]) - Progression Note    Patient Details  Name: Andrea Shepherd MRN: [id number] Date of Birth: Feb 10, 1935  Transition of Care [facility]) CM/SW [place], [place] Phone Number: 09/10/2020, 12:06 PM  Clinical Narrative:     Sent request for [facility] to review   Thurmond Butts, MSW, LCSW Clinical Social Worker   Expected Discharge Plan: [place] Barriers to Discharge: Continued Medical Work up  Expected Discharge Plan and Services Expected Discharge Plan: [place] In-house Referral: Clinical Social Work     Living arrangements for the past 2 months: Single Family Home                                       Social Determinants of Health (SDOH) Interventions    Readmission Risk Interventions No flowsheet data found.

## 2020-09-10 NOTE — Progress Notes (Signed)
PROGRESS NOTE    Andrea Shepherd  TZG:[id number] DOB: [birth year] DOA: 09/05/2020 PCP: Rochel Brome, MD   Brief Narrative: Andrea Shepherd is a 86 y.o. female with a history of PAD, hypertension, CKD stage IV, tobacco use, breast cancer status postmastectomy/radiation.  Patient presented secondary to a fall after getting up out of her and stubbing her toe on a chair.  She is found to have a left hip fracture.  Orthopedic surgery was consulted on admission. ORIF performed on 5/27. Patient developed evidence of critical limb ischemia requiring urgent aortogram w/thrombectomy/endarterectomy performed on  5/28.   Assessment & Plan:   Principal Problem:   Closed left hip fracture (HCC) Active Problems:   CKD (chronic kidney disease), stage IV (HCC)   Peripheral arterial occlusive disease (HCC)   Hypertensive urgency   Hypokalemia   Normocytic anemia   Closed fracture of 5th metacarpal   Pleural effusion, bilateral   Status post surgery   Left hip fracture Orthopedic surgery consulted. S/p ORIF on 5/27. -Orthopedic surgery recommendations: advance diet, up with therapy  Left 5th metacarpal fracture Secondary to fall. Ulnar gutter splint placed in the ED -Orthopedic surgery recommendations: continue splint  Hypertensive urgency Patient is on diltiazem as an outpatient.  Blood pressure continues to be elevated with significantly high pulse pressure. Concern this could be secondary to renal artery stenosis with abdominal bruit on exam. -Continue Cardizem CD 120 mg daily -Continue hydralazine 50 mg TID and titrate up for control  Bilateral pleural effusion Unknown etiology.  Patient with recent CT scan with small volume effusions at that time contributed to underlying COPD.  Concerning with patient's history of breast cancer in the past.  Patient is mildly symptomatic with mild cough and orthopnea. Cardiomegaly on imaging with an elevated BNP of 702. Transthoracic Echocardiogram significant for  a normal EF of 60-65% with grade 1 diastolic dysfunction and small pericardial effusion. Right thoracentesis performed on 5/29 with 650 mL removed. Fluid does not appear to be infectious related. -Pleural fluid protein/LDH pending, pleural fluid culture and cytology also pending  AKI on CKD stage IV Baseline creatinine of about 2.6.  Creatinine is significantly worsening. Associated elevated BUN and potassium. In setting of recent vascular intervention. -Continue IV fluids -Consult Nephrology  PAD Claudication Patient has a history of femoral bypass bilaterally with right femoral artery stent placed.  Patient is on Plavix as an outpatient.  She is also on simvastatin. Patient developed evidence if acutely ischemic limb of her left leg on 5/28. Vascular surgery was consulted and urgent aortogram was performed on 5/28; thrombectomy of left distal external iliac stent/left common femoral endarterectomy performed. -Continue simvastatin -Plavix restarted -Vascular surgery recommendations: concern she may need left leg amputation; NPO this morning for possible intervention  Acute blood loss anemia Chronic anemia In setting of kidney diease. Acute drop likely secondary to perioperative blood loss. Patient received 4 units of PRBC periop on 5/28. Post transfusion hemoglobin of 13.1 and stable -Trend CBC -Transfuse for hemoglobin <7  Abdominal bruit With history of poorly controlled hypertension, recurrent hypertensive urgency, kidney disease, it is possible patient has a degree of renal artery stenosis. She has had vascular procedures performed in the past. Will avoid ACEi for treatment of blood pressure. Discussed with vascular surgery and recommendation to continue medical management. -Continue BP control with medical management  Hyperkalemia In setting of worsening AKI on CKD stage IV. Given Lokelma 5 g. -Trend potassium   DVT prophylaxis: SCDs Code Status:   Code Status:  Full Code Family  Communication: None at bedside Disposition Plan: Discharge likely in 3-5 days pending orthopedic surgery management, vascular surgery recommendations, nephrology recommendations and eventual PT/OT recommendations   Consultants:   Orthopedic surgery  Vascular surgery  Procedures:   INTRAMEDULLARY NAIL OF LEFT HIP (08/21/2020)  AORTOGRAM (09/03/2020) 1.  Ultrasound-guided access of right common femoral artery 2.  Aortogram including catheter selection of aorta 3.  Left lower extremity arteriogram with selection of second-order branches 4.  Redo exposure of left common femoral artery greater than 30 days 5.  Thrombectomy of left distal external iliac stent 6.  Redo left common femoral endarterectomy with profundoplasty and bovine pericardial patch angioplasty  Antimicrobials:  None    Subjective: LLE pain.   Objective: Vitals:   09/09/20 2008 09/09/20 2340 09/10/20 0350 09/10/20 0744  BP: (!) 186/51 (!) 171/44 (!) 173/95 (!) 194/39  Pulse: 76 67 76 78  Resp: 16 16 14 20   Temp: 97.9 F (36.6 C) 97.9 F (36.6 C) 97.7 F (36.5 C) 97.9 F (36.6 C)  TempSrc: Oral Oral Oral Oral  SpO2: 90% 93% 94% 95%  Weight:      Height:        Intake/Output Summary (Last 24 hours) at 09/10/2020 0753 Last data filed at 09/10/2020 9518 Gross per 24 hour  Intake 1806.21 ml  Output 180 ml  Net 1626.21 ml   Filed Weights   09/05/2020 2208 08/25/2020 1814  Weight: 45.8 kg 45.8 kg    Examination:  General exam: Appears calm and comfortable and in no acute distress. Conversant Respiratory: Clear to auscultation. Respiratory effort normal with no intercostal retractions or use of accessory muscles Cardiovascular: S1 & S2 heard, RRR. No murmurs, rubs, gallops or clicks. No edema Gastrointestinal: Abdomen is nondistended, soft and nontender. No masses felt. Normal bowel sounds heard Neurologic: No focal neurological deficits Musculoskeletal: LLE slightly cyanotic with cold LE today. Skin: No  cyanosis. No new rashes Psychiatry: Alert and oriented. Memory intact. Blunt affect   Data Reviewed: I have personally reviewed following labs and imaging studies  CBC Lab Results  Component Value Date   WBC 10.2 09/09/2020   RBC 4.30 09/09/2020   HGB 13.4 09/09/2020   HCT 39.4 09/09/2020   MCV 91.6 09/09/2020   MCH 31.2 09/09/2020   PLT 188 09/09/2020   MCHC 34.0 09/09/2020   RDW 17.9 (H) 09/09/2020   LYMPHSABS 1.0 09/02/2020   MONOABS 0.6 08/19/2020   EOSABS 0.0 08/31/2020   BASOSABS 0.0 84/16/6063     Last metabolic panel Lab Results  Component Value Date   NA 136 09/10/2020   K 5.7 (H) 09/10/2020   CL 109 09/10/2020   CO2 16 (L) 09/10/2020   BUN 71 (H) 09/10/2020   CREATININE 3.70 (H) 09/10/2020   GLUCOSE 105 (H) 09/10/2020   GFRNONAA 12 (L) 09/10/2020   CALCIUM 8.1 (L) 09/10/2020   PROT 3.5 (L) 09/08/2020   ALBUMIN 3.8 06/21/2020   LABGLOB 2.7 06/21/2020   AGRATIO 1.4 06/21/2020   BILITOT 0.3 06/21/2020   ALKPHOS 86 06/21/2020   AST 22 06/21/2020   ALT 12 06/21/2020   ANIONGAP 11 09/10/2020    CBG (last 3)  No results for input(s): GLUCAP in the last 72 hours.   GFR: Estimated Creatinine Clearance: 8.3 mL/min (A) (by C-G formula based on SCr of 3.7 mg/dL (H)).  Coagulation Profile: Recent Labs  Lab 08/14/2020 2205  INR 0.9    Recent Results (from the past 240 hour(s))  Resp Panel  by RT-PCR (Flu A&B, Covid) Nasopharyngeal Swab     Status: None   Collection Time: 09/10/2020 10:15 PM   Specimen: Nasopharyngeal Swab; Nasopharyngeal(NP) swabs in vial transport medium  Result Value Ref Range Status   SARS Coronavirus 2 by RT PCR NEGATIVE NEGATIVE Final    Comment: (NOTE) SARS-CoV-2 target nucleic acids are NOT DETECTED.  The SARS-CoV-2 RNA is generally detectable in upper respiratory specimens during the acute phase of infection. The lowest concentration of SARS-CoV-2 viral copies this assay can detect is 138 copies/mL. A negative result does not  preclude SARS-Cov-2 infection and should not be used as the sole basis for treatment or other patient management decisions. A negative result may occur with  improper specimen collection/handling, submission of specimen other than nasopharyngeal swab, presence of viral mutation(s) within the areas targeted by this assay, and inadequate number of viral copies(<138 copies/mL). A negative result must be combined with clinical observations, patient history, and epidemiological information. The expected result is Negative.  Fact Sheet for Patients:  EntrepreneurPulse.com.au  Fact Sheet for Healthcare Providers:  IncredibleEmployment.be  This test is no t yet approved or cleared by the [place] FDA and  has been authorized for detection and/or diagnosis of SARS-CoV-2 by FDA under an Emergency Use Authorization (EUA). This EUA will remain  in effect (meaning this test can be used) for the duration of the COVID-19 declaration under Section 564(b)(1) of the Act, 21 U.S.C.section 360bbb-3(b)(1), unless the authorization is terminated  or revoked sooner.       Influenza A by PCR NEGATIVE NEGATIVE Final   Influenza B by PCR NEGATIVE NEGATIVE Final    Comment: (NOTE) The Xpert Xpress SARS-CoV-2/FLU/RSV plus assay is intended as an aid in the diagnosis of influenza from Nasopharyngeal swab specimens and should not be used as a sole basis for treatment. Nasal washings and aspirates are unacceptable for Xpert Xpress SARS-CoV-2/FLU/RSV testing.  Fact Sheet for Patients: EntrepreneurPulse.com.au  Fact Sheet for Healthcare Providers: IncredibleEmployment.be  This test is not yet approved or cleared by the [place] FDA and has been authorized for detection and/or diagnosis of SARS-CoV-2 by FDA under an Emergency Use Authorization (EUA). This EUA will remain in effect (meaning this test can be used) for the  duration of the COVID-19 declaration under Section 564(b)(1) of the Act, 21 U.S.C. section 360bbb-3(b)(1), unless the authorization is terminated or revoked.  Performed at [facility], [place] [address] [place], [place] [address]   Surgical pcr screen     Status: None   Collection Time: 09/05/20  6:59 PM   Specimen: Nasal Mucosa; Nasal Swab  Result Value Ref Range Status   MRSA, PCR NEGATIVE NEGATIVE Final   Staphylococcus aureus NEGATIVE NEGATIVE Final    Comment: (NOTE) The Xpert SA Assay (FDA approved for NASAL specimens in patients 84 years of age and older), is one component of a comprehensive surveillance program. It is not intended to diagnose infection nor to guide or monitor treatment. Performed at [facility], [place] [address], [place], [place] [address]   Gram stain     Status: None   Collection Time: 09/08/20  3:45 PM   Specimen: Pleura  Result Value Ref Range Status   Specimen Description PLEURAL FLUID  Final   Special Requests NONE  Final   Gram Stain   Final    NO WBC SEEN NO ORGANISMS SEEN CYTOSPIN SMEAR Performed at [facility], [address], [place], [place] [address]  Report Status 09/09/2020 FINAL  Final  Culture, body fluid w Gram Stain-bottle     Status: None (Preliminary result)   Collection Time: 09/08/20  3:45 PM   Specimen: Pleura  Result Value Ref Range Status   Specimen Description PLEURAL FLUID  Final   Special Requests NONE  Final   Culture   Final    NO GROWTH < 24 HOURS Performed at [facility], [place] [address], [place], [place] [address]    Report Status PENDING  Incomplete        Radiology Studies: DG CHEST PORT 1 VIEW  Result Date: 09/09/2020 CLINICAL DATA:  Short of breath, wheezing EXAM: PORTABLE CHEST 1 VIEW COMPARISON:  09/08/2020 FINDINGS: Single frontal view of the chest demonstrates a stable cardiac silhouette. Persistent bibasilar consolidation and effusions, without change since prior study.  No pneumothorax. No acute bony abnormalities. IMPRESSION: 1. Stable bibasilar consolidation and bilateral effusions. Electronically Signed   By: Randa Ngo M.D.   On: 09/09/2020 02:49   DG CHEST PORT 1 VIEW  Result Date: 09/08/2020 CLINICAL DATA:  Follow-up pneumothorax. EXAM: PORTABLE CHEST 1 VIEW COMPARISON:  09/08/2020. FINDINGS: The tiny right apical pneumothorax noted on the earlier exam is not visualized on the current study. Persistent opacity at both lung bases consistent with a combination of pleural fluid and atelectasis and/or infiltrate. No new lung abnormalities. Cardiac silhouette is mildly enlarged. No mediastinal or hilar masses. IMPRESSION: 1. No current evidence of a pneumothorax. 2. Persistent lung base opacities consistent with pleural effusions with associated atelectasis and/or infiltrate. Electronically Signed   By: Lajean Manes M.D.   On: 09/08/2020 19:14   DG Chest Port 1 View  Result Date: 09/08/2020 CLINICAL DATA:  86 year old female status post right-sided thoracentesis. EXAM: PORTABLE CHEST 1 VIEW COMPARISON:  Chest radiograph dated 08/28/2020. FINDINGS: Small bilateral pleural effusions with associated bibasilar atelectasis. Slight improvement in aeration of the right lung base compared to the prior radiograph. Faint linear density in the right apex may be artifactual or represent a tiny pneumothorax measuring approximately 5 mm in thickness. Follow-up recommended. Stable cardiomegaly. Atherosclerotic calcification of the aorta. No acute osseous pathology. Degenerative changes of spine. IMPRESSION: 1. Small bilateral pleural effusions with associated bibasilar atelectasis. Slight improvement in aeration of the right lung base compared to the prior radiograph. 2. Possible tiny right apical pneumothorax. Follow-up recommended. Electronically Signed   By: Anner Crete M.D.   On: 09/08/2020 15:20   US THORACENTESIS ASP PLEURAL SPACE W/IMG GUIDE  Result Date:  09/08/2020 INDICATION: Patient history of breast cancer, tobacco use, chronic kidney disease. Found to have pleural effusion. Request is for therapeutic and diagnostic thoracentesis EXAM: ULTRASOUND GUIDED THERAPEUTIC AND DIAGNOSTIC RIGHT-SIDED THORACENTESIS MEDICATIONS: LIDOCAINE 1% 10 ML COMPLICATIONS: SIR Level A - No therapy, no consequence.  Tiny pneumothorax. PROCEDURE: An ultrasound guided thoracentesis was thoroughly discussed with the patient and questions answered. The benefits, risks, alternatives and complications were also discussed. The patient understands and wishes to proceed with the procedure. Written consent was obtained. Ultrasound was performed to localize and mark an adequate pocket of fluid in the right chest. The area was then prepped and draped in the normal sterile fashion. 1% Lidocaine was used for local anesthesia. Under ultrasound guidance a 6 Fr Safe-T-Centesis catheter was introduced. Thoracentesis was performed. The catheter was removed and a dressing applied. FINDINGS: A total of approximately 650 mL of straw-colored fluid was removed. Samples were sent to the laboratory as requested by the clinical team. IMPRESSION:  Successful ultrasound guided therapeutic and diagnostic right sided thoracentesis yielding 650 mL of pleural fluid. Read by: Rushie Nyhan, NP Electronically Signed   By: Ruthann Cancer MD   On: 09/08/2020 15:46        Scheduled Meds: . sodium chloride   Intravenous Once  . sodium chloride   Intravenous Once  . Chlorhexidine Gluconate Cloth  6 each Topical Daily  . clopidogrel  75 mg Oral Daily  . diltiazem  120 mg Oral BID  . docusate sodium  100 mg Oral BID  . feeding supplement  237 mL Oral TID BM  . gabapentin  100 mg Oral QHS  . hydrALAZINE  50 mg Oral Q8H  . multivitamin with minerals  1 tablet Oral Daily  . polyethylene glycol  17 g Oral Daily  . simvastatin  20 mg Oral q1800   Continuous Infusions: . sodium chloride    . sodium chloride  75 mL/hr at 09/10/20 0650  . methocarbamol (ROBAXIN) IV       LOS: 5 days     Cordelia Poche, MD [facility] 09/10/2020, 7:53 AM  If 7PM-7AM, please contact night-coverage [email or web address]

## 2020-09-10 NOTE — Consult Note (Signed)
Reason for Consult: Acute kidney injury on chronic kidney disease stage IV Referring Physician: Cordelia Poche MD [facility])  HPI:  86 year old woman with past medical history significant for chronic kidney disease stage IV (baseline creatinine 2.6-2.8 with seeming progression over the past year), hypertension, peripheral vascular disease, current smoker and dyslipidemia who was admitted to the hospital 5 days ago with severe hip pain/hand pain after a fall at home.  Consequent evaluation showed an acute left fifth metacarpal fracture as well as an acute impacted intratrochanteric left hip fracture.  She underwent open treatment of her intertrochanteric fracture with IM implant on 5/27 and on 5/28 developed an ischemic left leg for which she had thrombectomy of the left distal external iliac stent with left CFA endarterectomy with profundoplasty and bovine pericardial patch angioplasty.  She has had problems with significantly elevated blood pressures with urine output possibly inaccurately charted but noted to be tailing off over the last 2 days now into oliguric range.  She complains of some pain in her left hip and leg as well as her left hand and has some shortness of breath.  She denies any chest pain, nausea, vomiting or diarrhea.  She denies any preceding fevers or chills.  Past Medical History:  Diagnosis Date  . Age-related osteoporosis without current pathological fracture   . Essential (primary) hypertension   . History of malignant neoplasm of breast   . Impaired fasting glucose   . Mixed hyperlipidemia   . Occlusion and stenosis of bilateral carotid arteries     Past Surgical History:  Procedure Laterality Date  . BREAST LUMPECTOMY  2010   right lower  . CAROTID ENDARTERECTOMY     Left  . CATARACT EXTRACTION Right 2016  . FEMORAL ARTERY STENT Right    04/2015  . FEMORAL BYPASS Bilateral    Left done 06/25/2015 and right done 07/2015  . left tumor removed from neck      Family  History  Problem Relation Age of Onset  . Cancer Father   . Cancer Sister     Social History:  reports that she has been smoking cigarettes. She has never used smokeless tobacco. She reports previous alcohol use. She reports that she does not use drugs.  Allergies:  Allergies  Allergen Reactions  . Sulfamethoxazole-Trimethoprim Diarrhea and Other (See Comments)    UNKNOWN REACTION   . Nitrofurantoin Diarrhea and Other (See Comments)    UNKNOWN REACTION     Medications:  Scheduled: . sodium chloride   Intravenous Once  . sodium chloride   Intravenous Once  . atorvastatin  40 mg Oral q1800  . Chlorhexidine Gluconate Cloth  6 each Topical Daily  . clopidogrel  75 mg Oral Daily  . diltiazem  120 mg Oral BID  . docusate sodium  100 mg Oral BID  . feeding supplement  237 mL Oral TID BM  . gabapentin  100 mg Oral QHS  . hydrALAZINE  50 mg Oral Q8H  . multivitamin with minerals  1 tablet Oral Daily  . polyethylene glycol  17 g Oral Daily    BMP Latest Ref Rng & Units 09/10/2020 09/10/2020 09/09/2020  Glucose 70 - 99 mg/dL - 105(H) 101(H)  BUN 8 - 23 mg/dL - 71(H) 65(H)  Creatinine 0.44 - 1.00 mg/dL - 3.70(H) 3.51(H)  BUN/Creat Ratio 12 - 28 - - -  Sodium 135 - 145 mmol/L - 136 137  Potassium 3.5 - 5.1 mmol/L 5.4(H) 5.7(H) 5.3(H)  Chloride 98 - 111 mmol/L -  109 110  CO2 22 - 32 mmol/L - 16(L) 19(L)  Calcium 8.9 - 10.3 mg/dL - 8.1(L) 7.9(L)   CBC Latest Ref Rng & Units 09/09/2020 09/08/2020 08/13/2020  WBC 4.0 - 10.5 K/uL 10.2 7.6 -  Hemoglobin 12.0 - 15.0 g/dL 13.4 13.1 9.2(L)  Hematocrit 36.0 - 46.0 % 39.4 38.5 27.0(L)  Platelets 150 - 400 K/uL 188 171 -    DG CHEST PORT 1 VIEW  Result Date: 09/09/2020 CLINICAL DATA:  Short of breath, wheezing EXAM: PORTABLE CHEST 1 VIEW COMPARISON:  09/08/2020 FINDINGS: Single frontal view of the chest demonstrates a stable cardiac silhouette. Persistent bibasilar consolidation and effusions, without change since prior study. No  pneumothorax. No acute bony abnormalities. IMPRESSION: 1. Stable bibasilar consolidation and bilateral effusions. Electronically Signed   By: Randa Ngo M.D.   On: 09/09/2020 02:49   DG CHEST PORT 1 VIEW  Result Date: 09/08/2020 CLINICAL DATA:  Follow-up pneumothorax. EXAM: PORTABLE CHEST 1 VIEW COMPARISON:  09/08/2020. FINDINGS: The tiny right apical pneumothorax noted on the earlier exam is not visualized on the current study. Persistent opacity at both lung bases consistent with a combination of pleural fluid and atelectasis and/or infiltrate. No new lung abnormalities. Cardiac silhouette is mildly enlarged. No mediastinal or hilar masses. IMPRESSION: 1. No current evidence of a pneumothorax. 2. Persistent lung base opacities consistent with pleural effusions with associated atelectasis and/or infiltrate. Electronically Signed   By: Lajean Manes M.D.   On: 09/08/2020 19:14   DG Chest Port 1 View  Result Date: 09/08/2020 CLINICAL DATA:  86 year old female status post right-sided thoracentesis. EXAM: PORTABLE CHEST 1 VIEW COMPARISON:  Chest radiograph dated 08/22/2020. FINDINGS: Small bilateral pleural effusions with associated bibasilar atelectasis. Slight improvement in aeration of the right lung base compared to the prior radiograph. Faint linear density in the right apex may be artifactual or represent a tiny pneumothorax measuring approximately 5 mm in thickness. Follow-up recommended. Stable cardiomegaly. Atherosclerotic calcification of the aorta. No acute osseous pathology. Degenerative changes of spine. IMPRESSION: 1. Small bilateral pleural effusions with associated bibasilar atelectasis. Slight improvement in aeration of the right lung base compared to the prior radiograph. 2. Possible tiny right apical pneumothorax. Follow-up recommended. Electronically Signed   By: Anner Crete M.D.   On: 09/08/2020 15:20   US THORACENTESIS ASP PLEURAL SPACE W/IMG GUIDE  Result Date:  09/08/2020 INDICATION: Patient history of breast cancer, tobacco use, chronic kidney disease. Found to have pleural effusion. Request is for therapeutic and diagnostic thoracentesis EXAM: ULTRASOUND GUIDED THERAPEUTIC AND DIAGNOSTIC RIGHT-SIDED THORACENTESIS MEDICATIONS: LIDOCAINE 1% 10 ML COMPLICATIONS: SIR Level A - No therapy, no consequence.  Tiny pneumothorax. PROCEDURE: An ultrasound guided thoracentesis was thoroughly discussed with the patient and questions answered. The benefits, risks, alternatives and complications were also discussed. The patient understands and wishes to proceed with the procedure. Written consent was obtained. Ultrasound was performed to localize and mark an adequate pocket of fluid in the right chest. The area was then prepped and draped in the normal sterile fashion. 1% Lidocaine was used for local anesthesia. Under ultrasound guidance a 6 Fr Safe-T-Centesis catheter was introduced. Thoracentesis was performed. The catheter was removed and a dressing applied. FINDINGS: A total of approximately 650 mL of straw-colored fluid was removed. Samples were sent to the laboratory as requested by the clinical team. IMPRESSION: Successful ultrasound guided therapeutic and diagnostic right sided thoracentesis yielding 650 mL of pleural fluid. Read by: Rushie Nyhan, NP Electronically Signed   By: Ruthann Cancer  MD   On: 09/08/2020 15:46    Review of Systems Blood pressure (!) 194/39, pulse 78, temperature 97.9 F (36.6 C), temperature source Oral, resp. rate 20, height 5\' 8"  (1.727 m), weight 45.8 kg, SpO2 95 %. Physical Exam Vitals and nursing note reviewed.  Constitutional:      Appearance: She is ill-appearing.     Comments: Slim appearing  HENT:     Head: Normocephalic.     Right Ear: External ear normal.     Left Ear: External ear normal.     Nose: Nose normal.     Mouth/Throat:     Mouth: Mucous membranes are moist.     Pharynx: No oropharyngeal exudate.  Eyes:      Conjunctiva/sclera: Conjunctivae normal.  Cardiovascular:     Rate and Rhythm: Normal rate and regular rhythm.     Heart sounds: No murmur heard.   Pulmonary:     Effort: Pulmonary effort is normal.     Comments: Distant breath sounds bilaterally with end expiratory wheeze Abdominal:     General: Abdomen is flat. Bowel sounds are normal.     Palpations: Abdomen is soft.     Tenderness: There is no abdominal tenderness. There is no guarding.  Musculoskeletal:     Cervical back: Normal range of motion and neck supple.     Right lower leg: No edema.     Left lower leg: No edema.     Comments: No lower extremity edema.  Skin:    General: Skin is dry.  Neurological:     Mental Status: She is alert and oriented to person, place, and time.  Psychiatric:        Mood and Affect: Mood normal.     Assessment/Plan: 1.  Acute kidney injury on chronic kidney disease stage IV: This appears to be likely multifactorial from hemodynamic perturbations in the setting of significantly elevated blood pressures, contrast exposure for limb salvage procedure and possibly exacerbated by acute hemoglobin drop.  She is oliguric at this time with hyperkalemia and anion gap metabolic acidosis for which medical management has been initiated.  I will switch her over to isotonic sodium bicarbonate to try and improve metabolic acidosis/hyperkalemia and provide volume.  I will send off for a urinalysis and urine electrolytes and put in orders for strict input and output charting.  I will order renal ultrasound to evaluate for any urinary retention/obstruction postoperatively.  Whereas she does not have any acute indications for dialysis at this time, she is at high risk for needing this with baseline renal insufficiency. Avoid nephrotoxic medications including NSAIDs and iodinated intravenous contrast exposure unless the latter is absolutely indicated.  Preferred narcotic agents for pain control are hydromorphone,  fentanyl, and methadone. Morphine should not be used. Avoid Baclofen and avoid oral sodium phosphate and magnesium citrate based laxatives / bowel preps. Continue strict Input and Output monitoring. Will monitor the patient closely with you and intervene or adjust therapy as indicated by changes in clinical status/labs. 2.  Hyperkalemia: Secondary to acute kidney injury.  We will discontinue potassium supplementation and begin scheduled potassium lowering treatment/isotonic sodium bicarbonate. 3.  Anion gap metabolic acidosis: Secondary to acute kidney injury, switch fluids to isotonic sodium bicarbonate. 4.  Left leg ischemia-acute on chronic status post redo left CFA endarterectomy/profundoplasty with bovine pericardial patch angioplasty and thrombectomy of the distal end left external iliac stent.  Based on assessment by the VS, appears to have the improving symptoms but a monophasic posterior  tibial signal at her ankle with visible mottling of the left foot.  No additional endovascular options for management and will need amputation if progresses. 5.  Left hip fracture: Following accidental fall and status post open management with intramedullary implant.  Awaiting PT/OT. 6.  Anemia: Likely from acute blood loss following hip fracture and losses associated with surgical intervention for left leg ischemia.  No indications for PRBC transfusion at this time.  We will continue close monitoring/management as indicated.  Mattilynn Forrer K. 09/10/2020, 10:50 AM

## 2020-09-10 NOTE — TOC Progression Note (Signed)
Transition of Care [facility]) - Progression Note    Patient Details  Name: Andrea Shepherd MRN: [id number] Date of Birth: 14-Sep-1934  Transition of Care [facility]) CM/SW [place], [place] Phone Number: 09/10/2020, 2:49 PM  Clinical Narrative:     Patient accepted bed offer w/[facility], MSW, LCSW Clinical Social Worker   Expected Discharge Plan: Skilled Nursing Facility Barriers to Discharge: Continued Medical Work up  Expected Discharge Plan and Services Expected Discharge Plan: [place] In-house Referral: Clinical Social Work     Living arrangements for the past 2 months: Single Family Home                                       Social Determinants of Health (SDOH) Interventions    Readmission Risk Interventions No flowsheet data found.

## 2020-09-11 ENCOUNTER — Inpatient Hospital Stay (HOSPITAL_COMMUNITY): Payer: Medicare Other

## 2020-09-11 ENCOUNTER — Other Ambulatory Visit: Payer: Self-pay

## 2020-09-11 DIAGNOSIS — Z9889 Other specified postprocedural states: Secondary | ICD-10-CM

## 2020-09-11 DIAGNOSIS — S62307A Unspecified fracture of fifth metacarpal bone, left hand, initial encounter for closed fracture: Secondary | ICD-10-CM | POA: Diagnosis not present

## 2020-09-11 DIAGNOSIS — S72002A Fracture of unspecified part of neck of left femur, initial encounter for closed fracture: Secondary | ICD-10-CM | POA: Diagnosis not present

## 2020-09-11 DIAGNOSIS — I160 Hypertensive urgency: Secondary | ICD-10-CM | POA: Diagnosis not present

## 2020-09-11 DIAGNOSIS — N184 Chronic kidney disease, stage 4 (severe): Secondary | ICD-10-CM | POA: Diagnosis not present

## 2020-09-11 HISTORY — PX: IR US GUIDE VASC ACCESS RIGHT: IMG2390

## 2020-09-11 HISTORY — PX: IR FLUORO GUIDE CV LINE RIGHT: IMG2283

## 2020-09-11 LAB — RENAL FUNCTION PANEL
Albumin: 1.8 g/dL — ABNORMAL LOW (ref 3.5–5.0)
Anion gap: 15 (ref 5–15)
BUN: 73 mg/dL — ABNORMAL HIGH (ref 8–23)
CO2: 20 mmol/L — ABNORMAL LOW (ref 22–32)
Calcium: 8.2 mg/dL — ABNORMAL LOW (ref 8.9–10.3)
Chloride: 101 mmol/L (ref 98–111)
Creatinine, Ser: 3.74 mg/dL — ABNORMAL HIGH (ref 0.44–1.00)
GFR, Estimated: 11 mL/min — ABNORMAL LOW (ref >60)
Glucose, Bld: 131 mg/dL — ABNORMAL HIGH (ref 70–99)
Phosphorus: 6.4 mg/dL — ABNORMAL HIGH (ref 2.5–4.6)
Potassium: 5.2 mmol/L — ABNORMAL HIGH (ref 3.5–5.1)
Sodium: 136 mmol/L (ref 135–145)

## 2020-09-11 LAB — BASIC METABOLIC PANEL
Anion gap: 17 — ABNORMAL HIGH (ref 5–15)
BUN: 74 mg/dL — ABNORMAL HIGH (ref 8–23)
CO2: 21 mmol/L — ABNORMAL LOW (ref 22–32)
Calcium: 8.2 mg/dL — ABNORMAL LOW (ref 8.9–10.3)
Chloride: 98 mmol/L (ref 98–111)
Creatinine, Ser: 3.76 mg/dL — ABNORMAL HIGH (ref 0.44–1.00)
GFR, Estimated: 11 mL/min — ABNORMAL LOW (ref >60)
Glucose, Bld: 105 mg/dL — ABNORMAL HIGH (ref 70–99)
Potassium: 4.7 mmol/L (ref 3.5–5.1)
Sodium: 136 mmol/L (ref 135–145)

## 2020-09-11 LAB — BLOOD GAS, ARTERIAL
Acid-Base Excess: 4.4 mmol/L — ABNORMAL HIGH (ref 0.0–2.0)
Bicarbonate: 28.7 mmol/L — ABNORMAL HIGH (ref 20.0–28.0)
Drawn by: 55062
FIO2: 60
O2 Saturation: 62.1 %
Patient temperature: 37
pCO2 arterial: 44.6 mmHg (ref 32.0–48.0)
pH, Arterial: 7.424 (ref 7.350–7.450)
pO2, Arterial: 35.3 mmHg — CL (ref 83.0–108.0)

## 2020-09-11 LAB — CBC
HCT: 42 % (ref 36.0–46.0)
Hemoglobin: 14 g/dL (ref 12.0–15.0)
MCH: 31 pg (ref 26.0–34.0)
MCHC: 33.3 g/dL (ref 30.0–36.0)
MCV: 93.1 fL (ref 80.0–100.0)
Platelets: 194 10*3/uL (ref 150–400)
RBC: 4.51 MIL/uL (ref 3.87–5.11)
RDW: 16.7 % — ABNORMAL HIGH (ref 11.5–15.5)
WBC: 7 10*3/uL (ref 4.0–10.5)
nRBC: 0 % (ref 0.0–0.2)

## 2020-09-11 LAB — PROTIME-INR
INR: 0.9 (ref 0.8–1.2)
Prothrombin Time: 11.7 seconds (ref 11.4–15.2)

## 2020-09-11 LAB — HEPATITIS B CORE ANTIBODY, TOTAL: Hep B Core Total Ab: NONREACTIVE

## 2020-09-11 LAB — HEPATITIS B SURFACE ANTIGEN: Hepatitis B Surface Ag: NONREACTIVE

## 2020-09-11 LAB — APTT: aPTT: 30 seconds (ref 24–36)

## 2020-09-11 LAB — TROPONIN I (HIGH SENSITIVITY): Troponin I (High Sensitivity): 101 ng/L (ref <18)

## 2020-09-11 LAB — HEPATITIS B SURFACE ANTIBODY,QUALITATIVE: Hep B S Ab: NONREACTIVE

## 2020-09-11 MED ORDER — ALTEPLASE 2 MG IJ SOLR: 2.0000 mg | Freq: Once | INTRAMUSCULAR | Status: DC | PRN

## 2020-09-11 MED ORDER — SODIUM CHLORIDE 0.9 % IV SOLN: 100.0000 mL | INTRAVENOUS | Status: DC | PRN

## 2020-09-11 MED ORDER — HEPARIN SODIUM (PORCINE) 1000 UNIT/ML DIALYSIS: 1000.0000 [IU] | INTRAMUSCULAR | Status: DC | PRN

## 2020-09-11 MED ORDER — CARVEDILOL 6.25 MG PO TABS
6.2500 mg | ORAL_TABLET | Freq: Two times a day (BID) | ORAL | Status: DC
  Administered 2020-09-11: 6.25 mg via ORAL
  Filled 2020-09-11: qty 1

## 2020-09-11 MED ORDER — NYSTATIN 100000 UNIT/ML MT SUSP
5.0000 mL | Freq: Four times a day (QID) | OROMUCOSAL | Status: DC
  Administered 2020-09-11 (×4): 500000 [IU] via ORAL
  Filled 2020-09-11 (×4): qty 5

## 2020-09-11 MED ORDER — HEPARIN SODIUM (PORCINE) 1000 UNIT/ML IJ SOLN
INTRAMUSCULAR | Status: AC
  Administered 2020-09-11: 3.8 mL
  Filled 2020-09-11: qty 1

## 2020-09-11 MED ORDER — LIDOCAINE HCL 1 % IJ SOLN
INTRAMUSCULAR | Status: AC
  Filled 2020-09-11: qty 20

## 2020-09-11 MED ORDER — HEPARIN SODIUM (PORCINE) 1000 UNIT/ML IJ SOLN
INTRAMUSCULAR | Status: AC
  Filled 2020-09-11: qty 1

## 2020-09-11 MED ORDER — LIDOCAINE HCL (PF) 1 % IJ SOLN
INTRAMUSCULAR | Status: DC | PRN
  Administered 2020-09-11: 20 mL

## 2020-09-11 NOTE — Care Management Important Message (Signed)
Important Message  Patient Details  Name: Andrea Shepherd MRN: [id number] Date of Birth: [birth year]   Medicare Important Message Given:  Yes     Andrea Shepherd 09/11/2020, 9:52 AM

## 2020-09-11 NOTE — Procedures (Signed)
Pre-procedure Diagnosis: ESRD Post-procedure Diagnosis: Same  Successful placement of tunneled HD catheter with tips terminating within the superior aspect of the right atrium.    Complications: None Immediate  EBL: Minimal   The catheter is ready for immediate use.   Jay Vermon Grays, MD Pager #: [phone]   

## 2020-09-11 NOTE — Progress Notes (Addendum)
Progress Note    09/11/2020 7:56 AM 4 Days Post-Op  Subjective:  Says pain in left foot is okay. Tender to touch. She is able to wiggle her toes   Vitals:   09/11/20 0313 09/11/20 0738  BP: (!) 151/41 (!) 179/54  Pulse: 74 72  Resp: 15 14  Temp: 97.7 F (36.5 C) 98.4 F (36.9 C)  SpO2: 94% 94%   Physical Exam: Cardiac:  regular Lungs: non labored Incisions: Left groin with Prevena wound VAC with good seal Extremities:  2+ femoral pulses bilaterally, left groin with Prevena VAC to suction, left foot remains cool and mottled. Able to wiggle toes. Decreased dorsi and plantar flexion. Right foot warm with Doppler DP Neurologic: alert and oriented  CBC    Component Value Date/Time   WBC 10.2 09/09/2020 0228   RBC 4.30 09/09/2020 0228   HGB 13.4 09/09/2020 0228   HGB 11.9 06/21/2020 1024   HCT 39.4 09/09/2020 0228   HCT 32.9 (L) 06/21/2020 1024   PLT 188 09/09/2020 0228   PLT 311 06/21/2020 1024   MCV 91.6 09/09/2020 0228   MCV 97 06/21/2020 1024   MCH 31.2 09/09/2020 0228   MCHC 34.0 09/09/2020 0228   RDW 17.9 (H) 09/09/2020 0228   RDW 13.5 06/21/2020 1024   LYMPHSABS 1.0 09/08/2020 2205   LYMPHSABS 1.0 06/21/2020 1024   MONOABS 0.6 08/30/2020 2205   EOSABS 0.0 08/27/2020 2205   EOSABS 0.1 06/21/2020 1024   BASOSABS 0.0 08/25/2020 2205   BASOSABS 0.0 06/21/2020 1024    BMET    Component Value Date/Time   NA 136 09/11/2020 0242   NA 142 06/21/2020 1024   K 5.2 (H) 09/11/2020 0242   CL 101 09/11/2020 0242   CO2 20 (L) 09/11/2020 0242   GLUCOSE 131 (H) 09/11/2020 0242   BUN 73 (H) 09/11/2020 0242   BUN 39 (H) 06/21/2020 1024   CREATININE 3.74 (H) 09/11/2020 0242   CALCIUM 8.2 (L) 09/11/2020 0242   GFRNONAA 11 (L) 09/11/2020 0242    INR    Component Value Date/Time   INR 0.9 08/17/2020 2205     Intake/Output Summary (Last 24 hours) at 09/11/2020 0756 Last data filed at 09/11/2020 0429 Gross per 24 hour  Intake 1661.28 ml  Output --  Net 1661.28 ml      Assessment/Plan:  86 y.o. female is s/p redo left common femoral endarterectomy with profundoplasty and bovine pericardial patchangioplastyincluding thrombectomy of the distal left external iliac stent  4 Days Post-Op. She is more alert today. Says that her foot feels okay able to wiggle toes and sensation intact. It is still cool and mottled. She lost signals in her foot yesterday. Continue Aspirin, statin, Plavix.  No further options for revascularization so would ultimately require an AKA if progression of ischemia/ rest pain. Hemodynamically stable. VSS. Medical management per primary team. PT/ OT recommending SNF   Karoline Caldwell, [place] [facility] [phone] 09/11/2020 7:56 AM   I have seen and evaluated the patient. I agree with the PA note as documented above.  Now postop day 4 status post redo left common femoral endarterectomy with profundoplasty and thrombectomy of the distal external iliac stent on the left.  I could find a monophasic PT signal in the left foot this morning and certainly her leg looks a lot better compared to the extensive mottling on Saturday when we were initially consulted.  She is able to wiggle her toes.  Keep Prevena VAC in the left  groin for little bit of serous drainage.  As previously discussed she has no further options for revascularization and would require above-knee amputation if she had progressive tissue loss or worsening pain.  I think she appears to be better although she does have some mottling in the foot still.  Appreciate medicine evaluation and nephrology for her AKI on CKD.  Marty Heck, MD [facility] [place] Office: [phone]

## 2020-09-11 NOTE — Progress Notes (Signed)
Patient ID: Andrea Shepherd, female   DOB: 1934/09/18, 86 y.o.   MRN: [id number] [facility] Progress Note   Assessment/ Plan:   1.  Acute kidney injury on chronic kidney disease stage IV: Anuric overnight with continued worsening of azotemia and seemingly plateaued creatinine.  Work-up indicates hemodynamically mediated renal injury from acute hemoglobin drop/hemodynamic fluctuations as well as contrast exposure for limb salvage procedure.  I will request for interventional radiology to help with placement of a nontunneled hemodialysis catheter anticipating need for dialysis later today or tomorrow.  She appears to be marginally hypervolemic and her metabolic acidosis/hyperkalemia are noncritical. 2.  Hyperkalemia: Secondary to acute kidney injury.    Improved with potassium lowering/isotonic sodium bicarbonate-I will monitor closely for dialysis 3.  Anion gap metabolic acidosis: Secondary to acute kidney injury, improved with isotonic sodium bicarbonate. 4.  Left leg ischemia-acute on chronic status post redo left CFA endarterectomy/profundoplasty with bovine pericardial patch angioplasty and thrombectomy of the distal end left external iliac stent.    Has been assessed closely by vascular surgery and if has worsening ischemia, the next option will be left leg amputation. 5.  Left hip fracture: Following accidental fall and status post open management with intramedullary implant.  Awaiting PT/OT. 6.  Anemia: Likely from acute blood loss following hip fracture and losses associated with surgical intervention for left leg ischemia.  Hemoglobin and hematocrit within acceptable range.  Subjective:   Reports to be feeling fatigued and understands her predicament with worsening renal function   Objective:   BP (!) 179/54 (BP Location: Right Arm)   Pulse 72   Temp 98.4 F (36.9 C) (Oral)   Resp 14   Ht 5\' 8"  (1.727 m)   Wt 56.2 kg   SpO2 94%   BMI 18.84 kg/m   Intake/Output Summary  (Last 24 hours) at 09/11/2020 3474 Last data filed at 09/11/2020 2595 Gross per 24 hour  Intake 1681.28 ml  Output --  Net 1681.28 ml   Weight change:   Physical Exam: Gen: Chronically ill-appearing, resting in bed CVS: Pulse regular rhythm, normal rate, S1 and S2 normal Resp: Fine rales bilaterally, no distinct wheeze/rhonchi Abd: Soft, flat, nontender Ext: Trace left lower extremity edema with visible mottling  Imaging: US RENAL  Result Date: 09/10/2020 CLINICAL DATA:  Chronic renal insufficiency EXAM: RENAL / URINARY TRACT ULTRASOUND COMPLETE COMPARISON:  01/09/2011 FINDINGS: Right Kidney: Renal measurements: 10.9 x 4.8 x 4.7 cm = volume: 128.4 mL. Diffuse increased renal cortical echotexture consistent with medical renal disease. Multiple right renal cysts are identified. The majority of the cysts are simple in appearance, largest in the lower pole measuring 2.6 cm. A minimally complex cyst within the upper pole right kidney measures 2.6 x 2.7 x 4.0 cm, with lobularity and a single thin septation. Previously this had measured 2.4 x 2.2 by 3.1 cm. No hydronephrosis or solid renal mass. Left Kidney: Renal measurements: 8.1 x 4.2 x 4.1 cm = volume: 71.6 mL. Diffuse increased renal echotexture consistent with medical renal disease. Marked cortical atrophy. No hydronephrosis or renal mass. Bladder: Appears normal for degree of bladder distention. Other: Incidental bilateral pleural effusions are seen, left greater than right. IMPRESSION: 1. Bilateral increased renal cortical echotexture consistent with medical renal disease. 2. Multiple right renal cysts. 3. Large bilateral pleural effusions. Electronically Signed   By: Randa Ngo M.D.   On: 09/10/2020 20:02    Labs: BMET Recent Labs  Lab 08/19/2020 0033 09/08/2020 0140 08/17/2020 2022 08/27/2020 2128 09/08/20  0500 09/09/20 1143 09/10/20 0108 09/10/20 0823 09/10/20 1653 09/11/20 0242  NA 140 136 135 134* 137 137 136  --  139 136  K 4.2  4.5 5.2* 5.5* 5.1 5.3* 5.7* 5.4* 5.2* 5.2*  CL 109 106  --  109 109 110 109  --  108 101  CO2 21* 16*  --   --  17* 19* 16*  --  19* 20*  GLUCOSE 100* 165*  --  131* 120* 101* 105*  --  114* 131*  BUN 38* 49*  --  49* 54* 65* 71*  --  70* 73*  CREATININE 2.72* 2.86*  --  3.10* 3.08* 3.51* 3.70*  --  3.76* 3.74*  CALCIUM 8.2* 8.2*  --   --  8.0* 7.9* 8.1*  --  8.3* 8.2*  PHOS  --   --   --   --   --   --   --   --   --  6.4*   CBC Recent Labs  Lab 09/05/2020 2205 09/05/20 0217 08/14/2020 0033 08/18/2020 0140 08/12/2020 1449 08/12/2020 2022 08/28/2020 2128 09/08/20 0500 09/09/20 0228  WBC 7.7   < > 8.6 8.6  --   --   --  7.6 10.2  NEUTROABS 6.0  --   --   --   --   --   --   --   --   HGB 9.8*   < > 8.3* 7.1*   < > 5.1* 9.2* 13.1 13.4  HCT 29.2*   < > 24.8* 21.8*   < > 15.0* 27.0* 38.5 39.4  MCV 99.0   < > 99.2 102.8*  --   --   --  91.2 91.6  PLT 310   < > 264 247  --   --   --  171 188   < > = values in this interval not displayed.    Medications:    . sodium chloride   Intravenous Once  . sodium chloride   Intravenous Once  . atorvastatin  40 mg Oral q1800  . Chlorhexidine Gluconate Cloth  6 each Topical Daily  . clopidogrel  75 mg Oral Daily  . diltiazem  120 mg Oral BID  . docusate sodium  100 mg Oral BID  . feeding supplement  237 mL Oral TID BM  . gabapentin  100 mg Oral QHS  . hydrALAZINE  50 mg Oral Q8H  . multivitamin with minerals  1 tablet Oral Daily  . nystatin  5 mL Oral QID  . polyethylene glycol  17 g Oral Daily   Elmarie Shiley, MD 09/11/2020, 9:09 AM

## 2020-09-11 NOTE — Progress Notes (Signed)
PROGRESS NOTE    Andrea Shepherd  ZOX:[id number] DOB: [birth year] DOA: 09/01/2020 PCP: Rochel Brome, MD   Brief Narrative: Andrea Shepherd is a 86 y.o. female with a history of PAD, hypertension, CKD stage IV, tobacco use, breast cancer status postmastectomy/radiation.  Patient presented secondary to a fall after getting up out of her and stubbing her toe on a chair.  She is found to have a left hip fracture.  Orthopedic surgery was consulted on admission. ORIF performed on 5/27. Patient developed evidence of critical limb ischemia requiring urgent aortogram w/thrombectomy/endarterectomy performed on  5/28.   Assessment & Plan:   Principal Problem:   Closed left hip fracture (HCC) Active Problems:   CKD (chronic kidney disease), stage IV (HCC)   Peripheral arterial occlusive disease (HCC)   Hypertensive urgency   Hypokalemia   Normocytic anemia   Closed fracture of 5th metacarpal   Pleural effusion, bilateral   Status post surgery   Left hip fracture -Orthopedic surgery consulted. S/p ORIF on 5/27. -Orthopedic surgery recommendations: advance diet, up with therapy -likely disposition SNF  Left 5th metacarpal fracture Secondary to fall. Ulnar gutter splint placed in the ED -Orthopedic surgery recommendations: continue splint  Hypertensive urgency, ongoing - Blood pressure remarkably uncontrolled over the past few days systolic blood pressure as high as 190  - Transition to carvedilol, discontinue diltiazem -monitor closely given borderline bradycardia - Continue hydralazine 50 mg TID - Consider amlodipine versus clonidine at this point if blood pressure remains elevated  Bilateral pleural effusion - Likely secondary to advancing kidney disease and poor volume control  - Echocardiogram significant for a normal EF of 60-65% with grade 1 diastolic dysfunction and small pericardial effusion.  - Right thoracentesis performed on 5/29 with 650 mL removed. Fluid does not appear to be  infectious related. -Pleural fluid protein/LDH pending, pleural fluid culture and cytology also pending   AKI on CKD stage IV - Baseline creatinine of about 2.6.  Creatinine is significantly worsening. Associated elevated BUN and potassium and poor fluid balance. -Nephrology following, plan for dialysis catheter and initiate dialysis today, unclear if this will be a permanent fixture in her healthcare or transient event due to contrast associated injury.  PAD Claudication -Bilateral femoral bypass; R femoral artery stent history -Continue Plavix, statin  -Vascular surgery was consulted and urgent aortogram was performed on 5/28; thrombectomy of left distal external iliac stent/left common femoral endarterectomy performed. -Vascular surgery continues to follow, holding off on left leg amputation at this time, appreciate insight and recommendations  Acute blood loss anemia Chronic anemia of chronic disease -Secondary to above, 4 unit PRBC postoperatively on 5/28, hemoglobin remained stable   Abdominal bruit With history of poorly controlled hypertension, recurrent hypertensive urgency, kidney disease, it is possible patient has a degree of renal artery stenosis. She has had vascular procedures performed in the past. Will avoid ACEi for treatment of blood pressure. Discussed with vascular surgery and recommendation to continue medical management. -Continue BP control with medical management  Hyperkalemia Secondary to above worsening kidney function, likely to be managed in dialysis as above  DVT prophylaxis: SCDs Code Status:   Code Status: Full Code Family Communication: None at bedside Disposition Plan: Pending orthopedic and vascular recommendations, if he remains on dialysis this will likely be a fairly large obstacle for discharge as she will likely end up in SNF for physical therapy given recent procedures and ambulatory dysfunction   Consultants:   Orthopedic surgery  Vascular  surgery  Procedures:  INTRAMEDULLARY NAIL OF LEFT HIP (09/10/2020)  AORTOGRAM (09/09/2020) 1.  Ultrasound-guided access of right common femoral artery 2.  Aortogram including catheter selection of aorta 3.  Left lower extremity arteriogram with selection of second-order branches 4.  Redo exposure of left common femoral artery greater than 30 days 5.  Thrombectomy of left distal external iliac stent 6.  Redo left common femoral endarterectomy with profundoplasty and bovine pericardial patch angioplasty  Antimicrobials:  None    Subjective: No acute issues or events overnight denies nausea vomiting diarrhea constipation headache fevers or chills.  Objective: Vitals:   09/10/20 2033 09/10/20 2356 09/11/20 0313 09/11/20 0500  BP: (!) 171/81 (!) 176/44 (!) 151/41   Pulse: (!) 56 92 74   Resp: 20 20 15    Temp: 97.8 F (36.6 C) 97.6 F (36.4 C) 97.7 F (36.5 C)   TempSrc: Oral Oral Oral   SpO2: 100% 92% 94%   Weight:    56.2 kg  Height:        Intake/Output Summary (Last 24 hours) at 09/11/2020 0740 Last data filed at 09/11/2020 0429 Gross per 24 hour  Intake 1661.28 ml  Output --  Net 1661.28 ml   Filed Weights   08/28/2020 2208 08/13/2020 1814 09/11/20 0500  Weight: 45.8 kg 45.8 kg 56.2 kg    Examination:  General exam: Appears calm and comfortable and in no acute distress. Respiratory: Coarse breath sounds bilaterally right greater than left without overt rales or wheeze Cardiovascular: S1 & S2 heard, RRR. No murmurs, rubs, gallops or clicks. No edema Gastrointestinal: Abdomen is nondistended, soft and nontender. No masses felt. Normal bowel sounds heard Neurologic: No focal neurological deficits Musculoskeletal: LLE slightly cyanotic with cold LE today. Skin: No cyanosis. No new rashes Psychiatry: Alert and oriented. Memory intact. Blunt affect   Data Reviewed: I have personally reviewed following labs and imaging studies  CBC Lab Results  Component Value Date    WBC 10.2 09/09/2020   RBC 4.30 09/09/2020   HGB 13.4 09/09/2020   HCT 39.4 09/09/2020   MCV 91.6 09/09/2020   MCH 31.2 09/09/2020   PLT 188 09/09/2020   MCHC 34.0 09/09/2020   RDW 17.9 (H) 09/09/2020   LYMPHSABS 1.0 09/02/2020   MONOABS 0.6 08/30/2020   EOSABS 0.0 08/16/2020   BASOSABS 0.0 99/35/7017     Last metabolic panel Lab Results  Component Value Date   NA 136 09/11/2020   K 5.2 (H) 09/11/2020   CL 101 09/11/2020   CO2 20 (L) 09/11/2020   BUN 73 (H) 09/11/2020   CREATININE 3.74 (H) 09/11/2020   GLUCOSE 131 (H) 09/11/2020   GFRNONAA 11 (L) 09/11/2020   CALCIUM 8.2 (L) 09/11/2020   PHOS 6.4 (H) 09/11/2020   PROT 3.5 (L) 09/08/2020   ALBUMIN 1.8 (L) 09/11/2020   LABGLOB 2.7 06/21/2020   AGRATIO 1.4 06/21/2020   BILITOT 0.3 06/21/2020   ALKPHOS 86 06/21/2020   AST 22 06/21/2020   ALT 12 06/21/2020   ANIONGAP 15 09/11/2020    CBG (last 3)  No results for input(s): GLUCAP in the last 72 hours.   GFR: Estimated Creatinine Clearance: 10.1 mL/min (A) (by C-G formula based on SCr of 3.74 mg/dL (H)).  Coagulation Profile: Recent Labs  Lab 08/16/2020 2205  INR 0.9    Recent Results (from the past 240 hour(s))  Resp Panel by RT-PCR (Flu A&B, Covid) Nasopharyngeal Swab     Status: None   Collection Time: 09/09/2020 10:15 PM   Specimen: Nasopharyngeal Swab;  Nasopharyngeal(NP) swabs in vial transport medium  Result Value Ref Range Status   SARS Coronavirus 2 by RT PCR NEGATIVE NEGATIVE Final    Comment: (NOTE) SARS-CoV-2 target nucleic acids are NOT DETECTED.  The SARS-CoV-2 RNA is generally detectable in upper respiratory specimens during the acute phase of infection. The lowest concentration of SARS-CoV-2 viral copies this assay can detect is 138 copies/mL. A negative result does not preclude SARS-Cov-2 infection and should not be used as the sole basis for treatment or other patient management decisions. A negative result may occur with  improper specimen  collection/handling, submission of specimen other than nasopharyngeal swab, presence of viral mutation(s) within the areas targeted by this assay, and inadequate number of viral copies(<138 copies/mL). A negative result must be combined with clinical observations, patient history, and epidemiological information. The expected result is Negative.  Fact Sheet for Patients:  EntrepreneurPulse.com.au  Fact Sheet for Healthcare Providers:  IncredibleEmployment.be  This test is no t yet approved or cleared by the [place] FDA and  has been authorized for detection and/or diagnosis of SARS-CoV-2 by FDA under an Emergency Use Authorization (EUA). This EUA will remain  in effect (meaning this test can be used) for the duration of the COVID-19 declaration under Section 564(b)(1) of the Act, 21 U.S.C.section 360bbb-3(b)(1), unless the authorization is terminated  or revoked sooner.       Influenza A by PCR NEGATIVE NEGATIVE Final   Influenza B by PCR NEGATIVE NEGATIVE Final    Comment: (NOTE) The Xpert Xpress SARS-CoV-2/FLU/RSV plus assay is intended as an aid in the diagnosis of influenza from Nasopharyngeal swab specimens and should not be used as a sole basis for treatment. Nasal washings and aspirates are unacceptable for Xpert Xpress SARS-CoV-2/FLU/RSV testing.  Fact Sheet for Patients: EntrepreneurPulse.com.au  Fact Sheet for Healthcare Providers: IncredibleEmployment.be  This test is not yet approved or cleared by the [place] FDA and has been authorized for detection and/or diagnosis of SARS-CoV-2 by FDA under an Emergency Use Authorization (EUA). This EUA will remain in effect (meaning this test can be used) for the duration of the COVID-19 declaration under Section 564(b)(1) of the Act, 21 U.S.C. section 360bbb-3(b)(1), unless the authorization is terminated or revoked.  Performed at [facility], [place] [address], [place], [place] [address]   Surgical pcr screen     Status: None   Collection Time: 09/05/20  6:59 PM   Specimen: Nasal Mucosa; Nasal Swab  Result Value Ref Range Status   MRSA, PCR NEGATIVE NEGATIVE Final   Staphylococcus aureus NEGATIVE NEGATIVE Final    Comment: (NOTE) The Xpert SA Assay (FDA approved for NASAL specimens in patients 30 years of age and older), is one component of a comprehensive surveillance program. It is not intended to diagnose infection nor to guide or monitor treatment. Performed at [facility], [place] [address] [place], [place] [address]   Gram stain     Status: None   Collection Time: 09/08/20  3:45 PM   Specimen: Pleura  Result Value Ref Range Status   Specimen Description PLEURAL FLUID  Final   Special Requests NONE  Final   Gram Stain   Final    NO WBC SEEN NO ORGANISMS SEEN CYTOSPIN SMEAR Performed at [facility], [address], [place],  [address]    Report Status 09/09/2020 FINAL  Final  Culture, body fluid w Gram Stain-bottle     Status: None (Preliminary result)   Collection  Time: 09/08/20  3:45 PM   Specimen: Pleura  Result Value Ref Range Status   Specimen Description PLEURAL FLUID  Final   Special Requests NONE  Final   Culture   Final    NO GROWTH 3 DAYS Performed at [facility], [address], [place], [place] [address]    Report Status PENDING  Incomplete        Radiology Studies: US RENAL  Result Date: 09/10/2020 CLINICAL DATA:  Chronic renal insufficiency EXAM: RENAL / URINARY TRACT ULTRASOUND COMPLETE COMPARISON:  01/09/2011 FINDINGS: Right Kidney: Renal measurements: 10.9 x 4.8 x 4.7 cm = volume: 128.4 mL. Diffuse increased renal cortical echotexture consistent with medical renal disease. Multiple right renal cysts are identified. The majority of the cysts are simple in appearance, largest in the lower pole measuring 2.6 cm. A minimally complex cyst within the  upper pole right kidney measures 2.6 x 2.7 x 4.0 cm, with lobularity and a single thin septation. Previously this had measured 2.4 x 2.2 by 3.1 cm. No hydronephrosis or solid renal mass. Left Kidney: Renal measurements: 8.1 x 4.2 x 4.1 cm = volume: 71.6 mL. Diffuse increased renal echotexture consistent with medical renal disease. Marked cortical atrophy. No hydronephrosis or renal mass. Bladder: Appears normal for degree of bladder distention. Other: Incidental bilateral pleural effusions are seen, left greater than right. IMPRESSION: 1. Bilateral increased renal cortical echotexture consistent with medical renal disease. 2. Multiple right renal cysts. 3. Large bilateral pleural effusions. Electronically Signed   By: Randa Ngo M.D.   On: 09/10/2020 20:02        Scheduled Meds: . sodium chloride   Intravenous Once  . sodium chloride   Intravenous Once  . atorvastatin  40 mg Oral q1800  . Chlorhexidine Gluconate Cloth  6 each Topical Daily  . clopidogrel  75 mg Oral Daily  . diltiazem  120 mg Oral BID  . docusate sodium  100 mg Oral BID  . feeding supplement  237 mL Oral TID BM  . gabapentin  100 mg Oral QHS  . hydrALAZINE  50 mg Oral Q8H  . multivitamin with minerals  1 tablet Oral Daily  . polyethylene glycol  17 g Oral Daily   Continuous Infusions: . sodium chloride    . methocarbamol (ROBAXIN) IV    . sodium bicarbonate 150 mEq in D5W infusion 100 mL/hr at 09/11/20 0429     LOS: 6 days   Holli Humbles DO  [facility] 09/11/2020, 7:40 AM  If 7PM-7AM, please contact night-coverage [email or web address]

## 2020-09-11 NOTE — Progress Notes (Signed)
Dialysis treatment rescheduled for Thursday, June 2 per Dr. Posey Pronto.  Primary RN, Rhetta Mura made aware.

## 2020-09-11 NOTE — Significant Event (Signed)
Rapid Response Event Note   Reason for Call :  Irregular HR, SOB  Initial Focused Assessment:  Alert and oriented x 4, mild retractions, mild crackles but patient HR keeps dropping to 30's on cardiac monitor and when that happens patient becomes extremely SOB and describes it as "heaviness/pressure" and braces herself on the bed.  BP 174/65 RR20 O2 92% 2L [place]  Interventions:  Ordered BMET, BIPAP, CXR, EKG and consulted Dr Cyd Silence who wanted an ABG Troponin  Plan of Care:  Will re-evaluate after results of labs.  2100: re-evaluated patient and patient appears to be resting comfortable HR in 70's with no episode of bradycardia but still awaiting labs.    Event Summary:   MD Notified: 1940 Call Time: 1925 Arrival Time: 1930 End Time: 2100  Charlyne Quale, RN

## 2020-09-11 NOTE — Progress Notes (Signed)
Received critical PO2, Dr. Cyd Silence made aware, additional labs ordered.

## 2020-09-11 NOTE — Progress Notes (Addendum)
Physical Therapy Treatment Patient Details Name: Andrea Shepherd MRN: [id number] DOB: Apr 09, 1935 Today's Date: 09/11/2020    History of Present Illness 86 y.o. F admitted to [facility] on 5/25 due to a fall, resulting in sharp pain in LLE and L hand. [name] showed an acute fracture of distal left fifth metacarpal diaphysis and a closed L hip fx. On 5/27  L Hip IM Nail. 5/28 left common femoral endarterectomy. 5/29 thoracentesis. PMHx: PAD, hypertension, chronic kidney disease stage IV, and current smoker.    PT Comments    Pt received in supine, oriented to self and somewhat to situation and agreeable to therapy session, but with noted slow processing/decreased initiation and needing multimodal cues to perform all mobility tasks this date. Pt performed stand pivot transfer from EOB to drop arm recliner with L PFRW and needed +2 maxA. Of note, pt BP elevated post-exertion and received on RA but SpO2 desat with minimal mobility. She needed 3L O2 [place] placed to remain >88% in seated/standing postures. Pt continues to benefit from PT services to progress toward functional mobility goals. Continue to recommend SNF.   Follow Up Recommendations  Supervision/Assistance - 24 hour;SNF;Supervision for mobility/OOB     Equipment Recommendations  Other (comment) (L Platform RW)    Recommendations for Other Services       Precautions / Restrictions Precautions Precautions: Fall Precaution Booklet Issued: No Precaution Comments: pt unable to recall WB restrictions/cognitive deficit Required Braces or Orthoses: Splint/Cast Splint/Cast: splint left hand with ace bandaging around it Restrictions Weight Bearing Restrictions: Yes LUE Weight Bearing: Weight bear through elbow only LLE Weight Bearing: Touchdown weight bearing Other Position/Activity Restrictions: TWB per order set, WBAT per op note for L LE - awaiting clarification    Mobility  Bed Mobility Overal bed mobility: Needs Assistance Bed Mobility:  Supine to Sit     Supine to sit: Mod assist;+2 for physical assistance;HOB elevated     General bed mobility comments: able to assist more with moving RLE but needs tactile cues to initiate, then good initiation of trunk with cues, heavy use of bed rail not following cues not to pull with LUE; +21modA transfer pad assist to foot flat    Transfers Overall transfer level: Needs assistance Equipment used: Left platform walker Transfers: Stand Pivot Transfers Sit to Stand: Max assist;+2 physical assistance;+2 safety/equipment Stand pivot transfers: Max assist;+2 physical assistance;+2 safety/equipment       General transfer comment: max cues for sequencing and LUE/LLE precautions, nearly totalA to sit on chair after pivoting  Ambulation/Gait                 Stairs             Wheelchair Mobility    Modified Rankin (Stroke Patients Only)       Balance Overall balance assessment: Needs assistance Sitting-balance support: Bilateral upper extremity supported;Feet supported Sitting balance-Leahy Scale: Fair Sitting balance - Comments: needs at least min guard due to cognition and slow righting reactions   Standing balance support: Bilateral upper extremity supported Standing balance-Leahy Scale: Zero Standing balance comment: physical assist for standing to maintain precautions and balance (+2 standing at [place])                            Cognition Arousal/Alertness: Awake/alert Behavior During Therapy: Flat affect Overall Cognitive Status: Impaired/Different from baseline Area of Impairment: Attention;Orientation;Memory;Safety/judgement;Following commands;Awareness;Problem solving  Orientation Level: Disoriented to;Time;Situation Current Attention Level: Sustained Memory: Decreased short-term memory;Decreased recall of precautions Following Commands: Follows one step commands inconsistently;Follows one step commands with  increased time Safety/Judgement: Decreased awareness of deficits;Decreased awareness of safety Awareness: Intellectual Problem Solving: Slow processing;Decreased initiation;Difficulty sequencing;Requires verbal cues;Requires tactile cues General Comments: Pt with decreased recall of precautions, poor problem solving and needing increased time and multimodal cues. Decreased receptiveness to education/safety techniques and slow to initiate. Pt at times seems aware of situation and at times unaware.      Exercises Other Exercises Other Exercises: ankle pumps x10 reps bilaterally    General Comments General comments (skin integrity, edema, etc.): pt desat to 85% seated EOB on RA, 2L O2 Butts placed and SpO2 desat to 86% with sitting at times so increased to 3L O2  and maintains 90-92%. HR 60's resting and increased to 80's-90's with exertion. R UE BP elevated 198/59 post-exertion seated in chair.      Pertinent Vitals/Pain Pain Assessment: Faces Faces Pain Scale: Hurts little more Pain Location: LLE (thigh) Pain Descriptors / Indicators: Aching;Grimacing;Discomfort Pain Intervention(s): Limited activity within patient's tolerance;Monitored during session;Repositioned    Home Living Family/patient expects to be discharged to:: Skilled nursing facility                    Prior Function            PT Goals (current goals can now be found in the care plan section) Acute Rehab PT Goals Patient Stated Goal: To go home PT Goal Formulation: With patient Time For Goal Achievement: 09/21/20 Potential to Achieve Goals: Good Progress towards PT goals: Progressing toward goals    Frequency    Min 3X/week      PT Plan Current plan remains appropriate    Co-evaluation              AM-PAC PT "6 Clicks" Mobility   Outcome Measure  Help needed turning from your back to your side while in a flat bed without using bedrails?: A Lot Help needed moving from lying on your back  to sitting on the side of a flat bed without using bedrails?: A Lot Help needed moving to and from a bed to a chair (including a wheelchair)?: Total Help needed standing up from a chair using your arms (e.g., wheelchair or bedside chair)?: Total Help needed to walk in hospital room?: Total Help needed climbing 3-5 steps with a railing? : Total 6 Click Score: 8    End of Session Equipment Utilized During Treatment: Gait belt;Oxygen Activity Tolerance: Patient tolerated treatment well;Other (comment) (difficulty following instructions for mobility tasks) Patient left: in chair;with call bell/phone within reach;with chair alarm set;Other (comment) (BUE elevated on pillows in recliner and heels floated/legs reclined) Nurse Communication: Mobility status;Precautions;Weight bearing status (in EPIC pt showing "low" fall risk but needs to be listed as "high" fall risk) PT Visit Diagnosis: Other abnormalities of gait and mobility (R26.89);Difficulty in walking, not elsewhere classified (R26.2);Muscle weakness (generalized) (M62.81)     Time: 5409-8119 PT Time Calculation (min) (ACUTE ONLY): 26 min  Charges:  $Therapeutic Activity: 23-37 mins                     Shina Wass P., PTA Acute Rehabilitation Services Pager: [phone] Office: [place] 09/11/2020, 12:01 PM

## 2020-09-11 DEATH — deceased

## 2020-09-12 ENCOUNTER — Inpatient Hospital Stay (HOSPITAL_COMMUNITY): Payer: Medicare Other

## 2020-09-12 DIAGNOSIS — J9601 Acute respiratory failure with hypoxia: Secondary | ICD-10-CM

## 2020-09-12 DIAGNOSIS — I160 Hypertensive urgency: Secondary | ICD-10-CM | POA: Diagnosis not present

## 2020-09-12 DIAGNOSIS — S62307A Unspecified fracture of fifth metacarpal bone, left hand, initial encounter for closed fracture: Secondary | ICD-10-CM | POA: Diagnosis not present

## 2020-09-12 DIAGNOSIS — N184 Chronic kidney disease, stage 4 (severe): Secondary | ICD-10-CM | POA: Diagnosis not present

## 2020-09-12 DIAGNOSIS — S72002A Fracture of unspecified part of neck of left femur, initial encounter for closed fracture: Secondary | ICD-10-CM | POA: Diagnosis not present

## 2020-09-12 DIAGNOSIS — Z452 Encounter for adjustment and management of vascular access device: Secondary | ICD-10-CM

## 2020-09-12 DIAGNOSIS — E43 Unspecified severe protein-calorie malnutrition: Secondary | ICD-10-CM | POA: Insufficient documentation

## 2020-09-12 LAB — POCT I-STAT 7, (LYTES, BLD GAS, ICA,H+H)
Acid-base deficit: 5 mmol/L — ABNORMAL HIGH (ref 0.0–2.0)
Acid-base deficit: 7 mmol/L — ABNORMAL HIGH (ref 0.0–2.0)
Bicarbonate: 23.8 mmol/L (ref 20.0–28.0)
Bicarbonate: 21 mmol/L (ref 20.0–28.0)
Calcium, Ion: 1.15 mmol/L (ref 1.15–1.40)
Calcium, Ion: 1.15 mmol/L (ref 1.15–1.40)
HCT: 34 % — ABNORMAL LOW (ref 36.0–46.0)
HCT: 33 % — ABNORMAL LOW (ref 36.0–46.0)
Hemoglobin: 11.6 g/dL — ABNORMAL LOW (ref 12.0–15.0)
Hemoglobin: 11.2 g/dL — ABNORMAL LOW (ref 12.0–15.0)
O2 Saturation: 97 %
O2 Saturation: 82 %
Patient temperature: 97.4
Patient temperature: 97.4
Potassium: 3.7 mmol/L (ref 3.5–5.1)
Potassium: 4.5 mmol/L (ref 3.5–5.1)
Sodium: 138 mmol/L (ref 135–145)
Sodium: 136 mmol/L (ref 135–145)
TCO2: 26 mmol/L (ref 22–32)
TCO2: 23 mmol/L (ref 22–32)
pCO2 arterial: 61.6 mmHg — ABNORMAL HIGH (ref 32.0–48.0)
pCO2 arterial: 51.5 mmHg — ABNORMAL HIGH (ref 32.0–48.0)
pH, Arterial: 7.192 — CL (ref 7.350–7.450)
pH, Arterial: 7.214 — ABNORMAL LOW (ref 7.350–7.450)
pO2, Arterial: 114 mmHg — ABNORMAL HIGH (ref 83.0–108.0)
pO2, Arterial: 55 mmHg — ABNORMAL LOW (ref 83.0–108.0)

## 2020-09-12 LAB — RENAL FUNCTION PANEL
Albumin: 1.7 g/dL — ABNORMAL LOW (ref 3.5–5.0)
Anion gap: 10 (ref 5–15)
BUN: 77 mg/dL — ABNORMAL HIGH (ref 8–23)
CO2: 27 mmol/L (ref 22–32)
Calcium: 8.4 mg/dL — ABNORMAL LOW (ref 8.9–10.3)
Chloride: 99 mmol/L (ref 98–111)
Creatinine, Ser: 3.85 mg/dL — ABNORMAL HIGH (ref 0.44–1.00)
GFR, Estimated: 11 mL/min — ABNORMAL LOW (ref >60)
Glucose, Bld: 132 mg/dL — ABNORMAL HIGH (ref 70–99)
Phosphorus: 6.5 mg/dL — ABNORMAL HIGH (ref 2.5–4.6)
Potassium: 5 mmol/L (ref 3.5–5.1)
Sodium: 136 mmol/L (ref 135–145)

## 2020-09-12 LAB — GLUCOSE, CAPILLARY
Glucose-Capillary: 207 mg/dL — ABNORMAL HIGH (ref 70–99)
Glucose-Capillary: 58 mg/dL — ABNORMAL LOW (ref 70–99)

## 2020-09-12 LAB — CBC
HCT: 41.7 % (ref 36.0–46.0)
Hemoglobin: 13.4 g/dL (ref 12.0–15.0)
MCH: 30.5 pg (ref 26.0–34.0)
MCHC: 32.1 g/dL (ref 30.0–36.0)
MCV: 94.8 fL (ref 80.0–100.0)
Platelets: 221 10*3/uL (ref 150–400)
RBC: 4.4 MIL/uL (ref 3.87–5.11)
RDW: 16.5 % — ABNORMAL HIGH (ref 11.5–15.5)
WBC: 6.2 10*3/uL (ref 4.0–10.5)
nRBC: 0 % (ref 0.0–0.2)

## 2020-09-12 LAB — TROPONIN I (HIGH SENSITIVITY): Troponin I (High Sensitivity): 99 ng/L — ABNORMAL HIGH (ref <18)

## 2020-09-12 LAB — UREA NITROGEN, URINE: Urea Nitrogen, Ur: 375 mg/dL

## 2020-09-12 MED ORDER — FENTANYL CITRATE (PF) 100 MCG/2ML IJ SOLN
INTRAMUSCULAR | Status: AC
  Administered 2020-09-12: 50 ug
  Filled 2020-09-12: qty 2

## 2020-09-12 MED ORDER — FENTANYL 2500MCG IN NS 250ML (10MCG/ML) PREMIX INFUSION
25.0000 ug/h | INTRAVENOUS | Status: DC
  Administered 2020-09-12: 25 ug/h via INTRAVENOUS
  Filled 2020-09-12 (×2): qty 250

## 2020-09-12 MED ORDER — HEPARIN SODIUM (PORCINE) 1000 UNIT/ML IJ SOLN
INTRAMUSCULAR | Status: AC
  Administered 2020-09-12: 3800 [IU] via INTRAVENOUS_CENTRAL
  Filled 2020-09-12: qty 4

## 2020-09-12 MED ORDER — ETOMIDATE 2 MG/ML IV SOLN
INTRAVENOUS | Status: AC
  Administered 2020-09-12: 20 mg
  Filled 2020-09-12: qty 20

## 2020-09-12 MED ORDER — FENTANYL CITRATE (PF) 100 MCG/2ML IJ SOLN: 25.0000 ug | INTRAMUSCULAR | Status: DC | PRN

## 2020-09-12 MED ORDER — NOREPINEPHRINE 4 MG/250ML-% IV SOLN
INTRAVENOUS | Status: AC
  Filled 2020-09-12: qty 250

## 2020-09-12 MED ORDER — DEXTROSE 50 % IV SOLN
12.5000 g | INTRAVENOUS | Status: AC
  Administered 2020-09-12: 12.5 g via INTRAVENOUS

## 2020-09-12 MED ORDER — DEXMEDETOMIDINE HCL IN NACL 400 MCG/100ML IV SOLN
0.0000 ug/kg/h | INTRAVENOUS | Status: DC
  Administered 2020-09-12: 0.2 ug/kg/h via INTRAVENOUS
  Filled 2020-09-12 (×2): qty 100

## 2020-09-12 MED ORDER — HEPARIN SODIUM (PORCINE) 5000 UNIT/ML IJ SOLN
5000.0000 [IU] | Freq: Three times a day (TID) | INTRAMUSCULAR | Status: DC
  Administered 2020-09-12 – 2020-09-13 (×2): 5000 [IU] via SUBCUTANEOUS
  Filled 2020-09-12 (×2): qty 1

## 2020-09-12 MED ORDER — PANTOPRAZOLE SODIUM 40 MG IV SOLR: 40.0000 mg | Freq: Every day | INTRAVENOUS | Status: DC

## 2020-09-12 MED ORDER — ROCURONIUM BROMIDE 10 MG/ML (PF) SYRINGE
PREFILLED_SYRINGE | INTRAVENOUS | Status: AC
  Administered 2020-09-12: 10 mg
  Filled 2020-09-12: qty 10

## 2020-09-12 MED ORDER — SODIUM CHLORIDE 0.9 % IV BOLUS: 500.0000 mL | Freq: Once | INTRAVENOUS | Status: DC

## 2020-09-12 MED ORDER — FENTANYL BOLUS VIA INFUSION
25.0000 ug | INTRAVENOUS | Status: DC | PRN
  Filled 2020-09-12: qty 100

## 2020-09-12 MED ORDER — ALBUMIN HUMAN 25 % IV SOLN
25.0000 g | Freq: Once | INTRAVENOUS | Status: AC
  Administered 2020-09-12: 25 g via INTRAVENOUS
  Filled 2020-09-12: qty 100

## 2020-09-12 MED ORDER — NOREPINEPHRINE 16 MG/250ML-% IV SOLN
0.0000 ug/min | INTRAVENOUS | Status: DC
  Administered 2020-09-12: 45 ug/min via INTRAVENOUS
  Administered 2020-09-13: 70 ug/min via INTRAVENOUS
  Filled 2020-09-12 (×4): qty 250

## 2020-09-12 MED ORDER — DEXTROSE 50 % IV SOLN
INTRAVENOUS | Status: AC
  Filled 2020-09-12: qty 50

## 2020-09-12 MED ORDER — MIDAZOLAM HCL 2 MG/2ML IJ SOLN
INTRAMUSCULAR | Status: AC
  Administered 2020-09-12: 2 mg
  Filled 2020-09-12: qty 2

## 2020-09-12 MED ORDER — FENTANYL CITRATE (PF) 100 MCG/2ML IJ SOLN: 25.0000 ug | Freq: Once | INTRAMUSCULAR | Status: DC

## 2020-09-12 NOTE — Progress Notes (Signed)
RT NOTE: RT called to HD due to patients bipap alarming and that RN and transport staff, transported patient alone. RT was not made aware of the transport and was not present. RT arrived bedside in HD and noted that bipap oxygen hose was not correctly placed and RT corrected it immediately. Patients vital signs remained stable throughout. RT will continue to monitor.

## 2020-09-12 NOTE — Progress Notes (Signed)
Patient is currently having marked/severe hypotension systolic BP in the 43I requiring multiple interventions to aid acceptable hemodynamics. Vasoactive meds ordered, fluid bolus given, patient is currently in the trendelenburg position.   ABG/arterial line placement unsuccessful due to profound hypotension and patient co-morbities of hypertension and history of significant vascular disease.

## 2020-09-12 NOTE — Progress Notes (Addendum)
Nutrition Follow Up  DOCUMENTATION CODES:   Underweight,Severe malnutrition in context of chronic illness  INTERVENTION:   Liberalize diet to REGULAR with 1200 ml fluid restriction   Recommend placement of Cortrak tube with EN given poor PO intake and severe malnutrition  Addendum: Spoke with MD regarding Cortrak, plan family meeting at 3 pm to discuss [place]  No BM x7 days- recommend strengthening bowel regimen/suppository   Ensure Enlive po BID, each supplement provides 350 kcal and 20 grams of protein  Renal MVI daily   NUTRITION DIAGNOSIS:   Severe Malnutrition related to chronic illness (CKD IV on HD) as evidenced by severe fat depletion,severe muscle depletion.   GOAL:   Patient will meet greater than or equal to 90% of their needs  MONITOR:   Supplement acceptance,PO intake,Labs,Weight trends,Skin,I & O's  REASON FOR ASSESSMENT:   Consult Assessment of nutrition requirement/status,Hip fracture protocol  ASSESSMENT:   Andrea Shepherd is a 86 y.o. female with medical history significant for peripheral arterial disease, hypertension, chronic kidney disease stage IV, and current smoker, now presenting to the emergency department for evaluation of severe left hip and hand pain after a fall at home.Pt admitted with lt hip fracture.   5/27- s/p L hip IMN 5/28- acute ischemia LLE, s/p aortogram, thrombectomy L iliac, redo L fem endarterecomy 5/29- s/p R sided thoracentesis- drained 650 ml   6/01- s/p placement of tunneled HD catheter   Patient now on BiPAP after rapid response yesterday. Going for first HD today. Unable to hold conversation with RD upon follow up. Per documentation, intake has not progressed since admission. Last four meal completions charted as 30%, 0%, 0%, 0%. Taking sips of Ensure inconsistently. Given severity of malnutrition and poor PO status patient would benefit from Cortrak with EN.    Admission weight: 45.8 kg  Current weight: 55.6 kg    Medications: colace, miralax Labs: Phosphorus 6.5 (H)   Diet Order:   Diet Order            Diet renal with fluid restriction Fluid restriction: 1200 mL Fluid; Room service appropriate? Yes; Fluid consistency: Thin  Diet effective now                 EDUCATION NEEDS:   Not appropriate for education at this time  Skin:  Skin Assessment: Reviewed RN Assessment  Last BM:  PTA  Height:   Ht Readings from Last 1 Encounters:  08/12/2020 5\' 8"  (1.727 m)    Weight:   Wt Readings from Last 1 Encounters:  09/12/20 55.6 kg    Ideal Body Weight:  63.6 kg  BMI:  Body mass index is 18.64 kg/m.  Estimated Nutritional Needs:   Kcal:  1700-1900 kcal  Protein:  85-100 grams  Fluid:  1000 ml + UOP  Mariana Single RD, LDN Clinical Nutrition Pager listed in [place]

## 2020-09-12 NOTE — Progress Notes (Signed)
RT NOTE: RT transported patient from [place] to 2H11 on bipap with rapid response RN.

## 2020-09-12 NOTE — Progress Notes (Signed)
PROGRESS NOTE    Andrea Shepherd  MPN:[id number] DOB: 07-Jul-1934 DOA: 08/23/2020 PCP: Rochel Brome, MD   Brief Narrative: Andrea Shepherd is a 86 y.o. female with a history of PAD, hypertension, CKD stage IV, tobacco use, breast cancer status postmastectomy/radiation.  Patient presented secondary to a fall after getting up out of her and stubbing her toe on a chair.  She is found to have a left hip fracture.  Orthopedic surgery was consulted on admission. ORIF performed on 5/27. Patient developed evidence of critical limb ischemia requiring urgent aortogram w/thrombectomy/endarterectomy performed on  5/28.   Assessment & Plan:   Principal Problem:   Closed left hip fracture (HCC) Active Problems:   CKD (chronic kidney disease), stage IV (HCC)   Peripheral arterial occlusive disease (HCC)   Hypertensive urgency   Hypokalemia   Normocytic anemia   Closed fracture of 5th metacarpal   Pleural effusion, bilateral   Status post surgery   Acute hypoxic respiratory failure in the setting of volume overload  -Patient BiPAP dependent this morning with bilateral pleural effusions on exam, sats improving with positive pressure, unable to effectively diurese given worsening kidney failure, planned for hemodialysis later today *Postdialysis patient's mental status continued to decline, glucose within normal limits, blood pressure somewhat low but within normal limits, questionably related to uremia as she did not tolerate much volume removal during dialysis.  **Early evening patient had worsening respiratory status and mental status placed back on BiPAP after dialysis, given hypotension, mental status changes and general worsening condition PCCM was contacted for further evaluation and transition to the ICU floor with likely intubation given her waning ability to protect her airway -husband at bedside this afternoon, lengthy discussion as below about goals of care, he wanted "everything done" other than a  feeding tube through the stomach.  Unclear if he truly understood the gravity of the situation, regardless he was called again this evening after patient's worsening condition to inform him of her status -he was unavailable but his neighbors did pick up the phone and will relay the message to him that his wife is clinically worsening and that he should come visit tonight given her precarious clinical status.  Left hip fracture, POA -Orthopedic surgery consulted. S/p ORIF on 5/27. -Orthopedic surgery recommendations: advance diet, up with therapy - wound vac ongoing  Left 5th metacarpal fracture - Secondary to fall. Ulnar gutter splint placed in the ED -Orthopedic surgery recommendations: continue splint  Hypertensive urgency, resolved - hypotensive now - Blood pressure remarkably uncontrolled over the past few days systolic blood pressure as high as 190  - Transition to carvedilol, discontinue diltiazem -monitor closely given borderline bradycardia - Continue hydralazine 50 mg TID - Consider amlodipine versus clonidine at this point if blood pressure remains elevated  Bilateral pleural effusion - Likely secondary to advancing kidney disease and poor volume control  - Echocardiogram significant for a normal EF of 60-65% with grade 1 diastolic dysfunction and small pericardial effusion.  - Right thoracentesis performed on 5/29 with 650 mL removed. Fluid does not appear to be infectious related. -Pleural fluid protein/LDH pending, pleural fluid culture and cytology also pending  AKI on CKD stage IV - Baseline creatinine of about 2.6.  Creatinine is significantly worsening -Nephrology following, plan for dialysis catheter and initiate dialysis today, unclear if this will be a permanent fixture in her healthcare or transient event due to contrast associated injury.  PAD Claudication Left heel ulcer, left knee eschars, POA -Bilateral femoral bypass; R  femoral artery stent history -Continue  Plavix, statin  -Vascular surgery was consulted and urgent aortogram was performed on 5/28; thrombectomy of left distal external iliac stent/left common femoral endarterectomy performed. -Vascular surgery continues to follow, holding off on left leg amputation at this time, appreciate insight and recommendations  Acute blood loss anemia Chronic anemia of chronic disease -Secondary to above, 4 unit PRBC postoperatively on 5/28, hemoglobin remains stable   Abdominal bruit With history of poorly controlled hypertension, recurrent hypertensive urgency, kidney disease, it is possible patient has a degree of renal artery stenosis - US unremarkable -Continue BP control with medical management  Hyperkalemia/Hypokalemia Secondary to above worsening kidney function, likely to be managed in dialysis as above  DVT prophylaxis: SCDs Code Status:   Code Status: Full Code Family Communication: Husband and friend Juanda Crumble at bedside today Disposition Plan: Transitioning to the ICU given worsening condition, ultimate plan initially was discharged to SNF, at this point if her renal function does not continue to improve and her mental status does not follow her risk for mortality in-house remains quite high  Consultants:   Orthopedic surgery  Vascular surgery  PCCM  Procedures:   INTRAMEDULLARY NAIL OF LEFT HIP (08/18/2020)  AORTOGRAM (08/25/2020) 1.  Ultrasound-guided access of right common femoral artery 2.  Aortogram including catheter selection of aorta 3.  Left lower extremity arteriogram with selection of second-order branches 4.  Redo exposure of left common femoral artery greater than 30 days 5.  Thrombectomy of left distal external iliac stent 6.  Redo left common femoral endarterectomy with profundoplasty and bovine pericardial patch angioplasty  Antimicrobials:  None    Subjective: Rapid response overnight/early this morning due to respiratory status, resolved with BiPAP, volume  overload noted on exam, patient quite somnolent/difficult to arouse -plan for dialysis later today for volume control.  Objective: Vitals:   09/11/20 2100 09/11/20 2300 09/12/20 0500 09/12/20 0700  BP: (!) 180/67 (!) 181/65 (!) 180/70 (!) 181/85  Pulse: 77 77 81 90  Resp: 17 17 15  (!) 22  Temp:      TempSrc:      SpO2: 98% 98% 98% 95%  Weight:    56.3 kg  Height:        Intake/Output Summary (Last 24 hours) at 09/12/2020 0749 Last data filed at 09/11/2020 1200 Gross per 24 hour  Intake 20 ml  Output --  Net 20 ml   Filed Weights   09/10/2020 1814 09/11/20 0500 09/12/20 0700  Weight: 45.8 kg 56.2 kg 56.3 kg    Examination:  General exam: Somnolent, minimally responsive, BiPAP dependent at this point Respiratory: Coarse breath sounds bilaterally right greater than left without overt rales or wheeze Cardiovascular: S1 & S2 heard, RRR. No murmurs, rubs, gallops or clicks. No edema.  Right double-lumen IJ dialysis catheter Gastrointestinal: Abdomen is nondistended, soft and nontender. No masses felt. Normal bowel sounds heard Neurologic: No focal neurological deficits Musculoskeletal: LLE slightly cyanotic with cold LE today. Skin: Left foot heel ulceration, 2 small left anterior knee eschars, left hip wound VAC intact, left forearm skin tear with serosanguineous fluid Psychiatry: Unable to assess due to somnolence today   Data Reviewed: I have personally reviewed following labs and imaging studies  CBC Lab Results  Component Value Date   WBC 6.2 09/12/2020   RBC 4.40 09/12/2020   HGB 13.4 09/12/2020   HCT 41.7 09/12/2020   MCV 94.8 09/12/2020   MCH 30.5 09/12/2020   PLT 221 09/12/2020   MCHC 32.1 09/12/2020  RDW 16.5 (H) 09/12/2020   LYMPHSABS 1.0 09/05/2020   MONOABS 0.6 09/03/2020   EOSABS 0.0 08/25/2020   BASOSABS 0.0 71/69/6789     Last metabolic panel Lab Results  Component Value Date   NA 136 09/12/2020   K 5.0 09/12/2020   CL 99 09/12/2020   CO2 27  09/12/2020   BUN 77 (H) 09/12/2020   CREATININE 3.85 (H) 09/12/2020   GLUCOSE 132 (H) 09/12/2020   GFRNONAA 11 (L) 09/12/2020   CALCIUM 8.4 (L) 09/12/2020   PHOS 6.5 (H) 09/12/2020   PROT 3.5 (L) 09/08/2020   ALBUMIN 1.7 (L) 09/12/2020   LABGLOB 2.7 06/21/2020   AGRATIO 1.4 06/21/2020   BILITOT 0.3 06/21/2020   ALKPHOS 86 06/21/2020   AST 22 06/21/2020   ALT 12 06/21/2020   ANIONGAP 10 09/12/2020    CBG (last 3)  No results for input(s): GLUCAP in the last 72 hours.   GFR: Estimated Creatinine Clearance: 9.8 mL/min (A) (by C-G formula based on SCr of 3.85 mg/dL (H)).  Coagulation Profile: Recent Labs  Lab 09/11/20 2054  INR 0.9    Recent Results (from the past 240 hour(s))  Resp Panel by RT-PCR (Flu A&B, Covid) Nasopharyngeal Swab     Status: None   Collection Time: 09/03/2020 10:15 PM   Specimen: Nasopharyngeal Swab; Nasopharyngeal(NP) swabs in vial transport medium  Result Value Ref Range Status   SARS Coronavirus 2 by RT PCR NEGATIVE NEGATIVE Final    Comment: (NOTE) SARS-CoV-2 target nucleic acids are NOT DETECTED.  The SARS-CoV-2 RNA is generally detectable in upper respiratory specimens during the acute phase of infection. The lowest concentration of SARS-CoV-2 viral copies this assay can detect is 138 copies/mL. A negative result does not preclude SARS-Cov-2 infection and should not be used as the sole basis for treatment or other patient management decisions. A negative result may occur with  improper specimen collection/handling, submission of specimen other than nasopharyngeal swab, presence of viral mutation(s) within the areas targeted by this assay, and inadequate number of viral copies(<138 copies/mL). A negative result must be combined with clinical observations, patient history, and epidemiological information. The expected result is Negative.  Fact Sheet for Patients:  EntrepreneurPulse.com.au  Fact Sheet for Healthcare  Providers:  IncredibleEmployment.be  This test is no t yet approved or cleared by the [place] FDA and  has been authorized for detection and/or diagnosis of SARS-CoV-2 by FDA under an Emergency Use Authorization (EUA). This EUA will remain  in effect (meaning this test can be used) for the duration of the COVID-19 declaration under Section 564(b)(1) of the Act, 21 U.S.C.section 360bbb-3(b)(1), unless the authorization is terminated  or revoked sooner.       Influenza A by PCR NEGATIVE NEGATIVE Final   Influenza B by PCR NEGATIVE NEGATIVE Final    Comment: (NOTE) The Xpert Xpress SARS-CoV-2/FLU/RSV plus assay is intended as an aid in the diagnosis of influenza from Nasopharyngeal swab specimens and should not be used as a sole basis for treatment. Nasal washings and aspirates are unacceptable for Xpert Xpress SARS-CoV-2/FLU/RSV testing.  Fact Sheet for Patients: EntrepreneurPulse.com.au  Fact Sheet for Healthcare Providers: IncredibleEmployment.be  This test is not yet approved or cleared by the [place] FDA and has been authorized for detection and/or diagnosis of SARS-CoV-2 by FDA under an Emergency Use Authorization (EUA). This EUA will remain in effect (meaning this test can be used) for the duration of the COVID-19 declaration under Section 564(b)(1) of the Act, 21 U.S.C.  section 360bbb-3(b)(1), unless the authorization is terminated or revoked.  Performed at [facility], [place] [address], [place], [place] [address]   Surgical pcr screen     Status: None   Collection Time: 09/05/20  6:59 PM   Specimen: Nasal Mucosa; Nasal Swab  Result Value Ref Range Status   MRSA, PCR NEGATIVE NEGATIVE Final   Staphylococcus aureus NEGATIVE NEGATIVE Final    Comment: (NOTE) The Xpert SA Assay (FDA approved for NASAL specimens in patients 7 years of age and older), is one component of a comprehensive surveillance  program. It is not intended to diagnose infection nor to guide or monitor treatment. Performed at [facility], [place] [address] [place], [place] [address]   Gram stain     Status: None   Collection Time: 09/08/20  3:45 PM   Specimen: Pleura  Result Value Ref Range Status   Specimen Description PLEURAL FLUID  Final   Special Requests NONE  Final   Gram Stain   Final    NO WBC SEEN NO ORGANISMS SEEN CYTOSPIN SMEAR Performed at [facility], [address] [place], [place] [address]    Report Status 09/09/2020 FINAL  Final  Culture, body fluid w Gram Stain-bottle     Status: None (Preliminary result)   Collection Time: 09/08/20  3:45 PM   Specimen: Pleura  Result Value Ref Range Status   Specimen Description PLEURAL FLUID  Final   Special Requests NONE  Final   Culture   Final    NO GROWTH 4 DAYS Performed at [facility] [address], [place], [place] [address]    Report Status PENDING  Incomplete        Radiology Studies: US RENAL  Result Date: 09/10/2020 CLINICAL DATA:  Chronic renal insufficiency EXAM: RENAL / URINARY TRACT ULTRASOUND COMPLETE COMPARISON:  01/09/2011 FINDINGS: Right Kidney: Renal measurements: 10.9 x 4.8 x 4.7 cm = volume: 128.4 mL. Diffuse increased renal cortical echotexture consistent with medical renal disease. Multiple right renal cysts are identified. The majority of the cysts are simple in appearance, largest in the lower pole measuring 2.6 cm. A minimally complex cyst within the upper pole right kidney measures 2.6 x 2.7 x 4.0 cm, with lobularity and a single thin septation. Previously this had measured 2.4 x 2.2 by 3.1 cm. No hydronephrosis or solid renal mass. Left Kidney: Renal measurements: 8.1 x 4.2 x 4.1 cm = volume: 71.6 mL. Diffuse increased renal echotexture consistent with medical renal disease. Marked cortical atrophy. No hydronephrosis or renal mass. Bladder: Appears normal for degree of bladder distention. Other:  Incidental bilateral pleural effusions are seen, left greater than right. IMPRESSION: 1. Bilateral increased renal cortical echotexture consistent with medical renal disease. 2. Multiple right renal cysts. 3. Large bilateral pleural effusions. Electronically Signed   By: Randa Ngo M.D.   On: 09/10/2020 20:02   IR Fluoro Guide CV Line Right  Result Date: 09/11/2020 INDICATION: End-stage renal disease. In need of durable intravenous access for initiation of hemodialysis. EXAM: TUNNELED CENTRAL VENOUS HEMODIALYSIS CATHETER PLACEMENT WITH ULTRASOUND AND FLUOROSCOPIC GUIDANCE MEDICATIONS: The patient is currently admitted to the hospital and receiving intravenous antibiotics. The antibiotic was given in an appropriate time interval prior to skin puncture. ANESTHESIA/SEDATION: None FLUOROSCOPY TIME:  1 minute (4 mGy) COMPLICATIONS: None immediate. PROCEDURE: Informed written consent was obtained from the patient after a discussion of the risks, benefits, and alternatives to treatment. Questions regarding the procedure were encouraged and  answered. The right neck and chest were prepped with chlorhexidine in a sterile fashion, and a sterile drape was applied covering the operative field. Maximum barrier sterile technique with sterile gowns and gloves were used for the procedure. A timeout was performed prior to the initiation of the procedure. After creating a small venotomy incision, a micropuncture kit was utilized to access the internal jugular vein. Real-time ultrasound guidance was utilized for vascular access including the acquisition of a permanent ultrasound image documenting patency of the accessed vessel. The microwire was utilized to measure appropriate catheter length. A stiff Glidewire was advanced to the level of the IVC and the micropuncture sheath was exchanged for a peel-away sheath. A palindrome tunneled hemodialysis catheter measuring 19 cm from tip to cuff was tunneled in a retrograde fashion  from the anterior chest wall to the venotomy incision. The catheter was then placed through the peel-away sheath with tips ultimately positioned within the distal aspect of the SVC. Attempted aspiration from the arterial lumen resulting in sluggish flow despite fluoroscopic reposition of the dialysis catheter. As such, the existing 19 cm tip to cuff dialysis catheter was cannulated with a stiff Glidewire which was advanced to the level of the IVC. Under fluoroscopic guidance, a new dialysis catheter was exchanged for a slightly larger now 23 cm tip to cuff dialysis catheter with tip now more ideally positioned within the superior aspect the right atrium. The catheter aspirates and flushes normally. The catheter was flushed with appropriate volume heparin dwells. The catheter exit site was secured with a 0-Prolene retention suture. The venotomy incision was closed with Dermabond and Steri-strips. Dressings were applied. The patient tolerated the procedure well without immediate post procedural complication. IMPRESSION: Successful placement of 23 cm tip to cuff tunneled hemodialysis catheter via the right internal jugular vein with tips terminating within the superior aspect of the right atrium. The catheter is ready for immediate use. Electronically Signed   By: Sandi Mariscal M.D.   On: 09/11/2020 14:59   IR US Guide Vasc Access Right  Result Date: 09/11/2020 INDICATION: End-stage renal disease. In need of durable intravenous access for initiation of hemodialysis. EXAM: TUNNELED CENTRAL VENOUS HEMODIALYSIS CATHETER PLACEMENT WITH ULTRASOUND AND FLUOROSCOPIC GUIDANCE MEDICATIONS: The patient is currently admitted to the hospital and receiving intravenous antibiotics. The antibiotic was given in an appropriate time interval prior to skin puncture. ANESTHESIA/SEDATION: None FLUOROSCOPY TIME:  1 minute (4 mGy) COMPLICATIONS: None immediate. PROCEDURE: Informed written consent was obtained from the patient after a  discussion of the risks, benefits, and alternatives to treatment. Questions regarding the procedure were encouraged and answered. The right neck and chest were prepped with chlorhexidine in a sterile fashion, and a sterile drape was applied covering the operative field. Maximum barrier sterile technique with sterile gowns and gloves were used for the procedure. A timeout was performed prior to the initiation of the procedure. After creating a small venotomy incision, a micropuncture kit was utilized to access the internal jugular vein. Real-time ultrasound guidance was utilized for vascular access including the acquisition of a permanent ultrasound image documenting patency of the accessed vessel. The microwire was utilized to measure appropriate catheter length. A stiff Glidewire was advanced to the level of the IVC and the micropuncture sheath was exchanged for a peel-away sheath. A palindrome tunneled hemodialysis catheter measuring 19 cm from tip to cuff was tunneled in a retrograde fashion from the anterior chest wall to the venotomy incision. The catheter was then placed through the peel-away sheath  with tips ultimately positioned within the distal aspect of the SVC. Attempted aspiration from the arterial lumen resulting in sluggish flow despite fluoroscopic reposition of the dialysis catheter. As such, the existing 19 cm tip to cuff dialysis catheter was cannulated with a stiff Glidewire which was advanced to the level of the IVC. Under fluoroscopic guidance, a new dialysis catheter was exchanged for a slightly larger now 23 cm tip to cuff dialysis catheter with tip now more ideally positioned within the superior aspect the right atrium. The catheter aspirates and flushes normally. The catheter was flushed with appropriate volume heparin dwells. The catheter exit site was secured with a 0-Prolene retention suture. The venotomy incision was closed with Dermabond and Steri-strips. Dressings were applied. The  patient tolerated the procedure well without immediate post procedural complication. IMPRESSION: Successful placement of 23 cm tip to cuff tunneled hemodialysis catheter via the right internal jugular vein with tips terminating within the superior aspect of the right atrium. The catheter is ready for immediate use. Electronically Signed   By: Sandi Mariscal M.D.   On: 09/11/2020 14:59   DG Chest Port 1 View  Result Date: 09/11/2020 CLINICAL DATA:  Acute respiratory distress EXAM: PORTABLE CHEST 1 VIEW COMPARISON:  09/09/2020 FINDINGS: Cardiac shadow is within normal limits. Aortic calcifications are noted. There is been interval placement of a right jugular dialysis catheter in satisfactory position. Bilateral pleural effusions are noted. Left retrocardiac consolidation is again noted and stable. No pneumothorax is seen. IMPRESSION: No pneumothorax following dialysis catheter placement. Bilateral effusions and left retrocardiac consolidation stable from the prior study. Electronically Signed   By: Inez Catalina M.D.   On: 09/11/2020 19:37    Scheduled Meds: . sodium chloride   Intravenous Once  . sodium chloride   Intravenous Once  . atorvastatin  40 mg Oral q1800  . carvedilol  6.25 mg Oral BID WC  . Chlorhexidine Gluconate Cloth  6 each Topical Daily  . clopidogrel  75 mg Oral Daily  . docusate sodium  100 mg Oral BID  . feeding supplement  237 mL Oral TID BM  . gabapentin  100 mg Oral QHS  . hydrALAZINE  50 mg Oral Q8H  . multivitamin with minerals  1 tablet Oral Daily  . nystatin  5 mL Oral QID  . polyethylene glycol  17 g Oral Daily   Continuous Infusions: . sodium chloride    . sodium chloride    . sodium chloride    . methocarbamol (ROBAXIN) IV     Time spent:52mn at bedside, discussing with family, chart review and at bedside on multiple occasions given acute critical illness as outlined above   LOS: 7 days   WHolli HumblesDO  [facility] 09/12/2020, 7:49 AM  If  7PM-7AM, please contact night-coverage [email or web address]

## 2020-09-12 NOTE — Significant Event (Signed)
Rapid Response Event Note   Reason for Call :  Unresponsive, hypoglycemia- CBG 58  Initial Focused Assessment:  Pt will inconsistently will grimace to noxious stimuli. No distress. PERRLA, 80mm. Placed back on BiPAP. Pt noted to be hypothermic earlier today.   VS: BP 87/42, HR 85, RR 24, SpO2 95% on BiPAP 80% 12/8  Interventions:  -12.5g IV Dextrose 50% -500 cc IVF bolus  Plan of Care:  -Transfer to ICU for intubation  Event Summary:  MD Notified: Dr. Avon Gully Call Time: [place] Time: 6394 End Time: [place], RN

## 2020-09-12 NOTE — Procedures (Signed)
Intubation Procedure Note  Roxsana Riding  [id number]  [birth year]  Date:09/12/20  Time:6:21 PM   Provider Performing:Esli Jernigan S Marcello Fennel.   Procedure: Intubation (31500)  Indication(s) Respiratory Failure  Consent Unable to obtain consent due to emergent nature of procedure.   Anesthesia Etomidate, Versed, Fentanyl and Rocuronium. See MAR for full medication record.   Time Out Verified patient identification, verified procedure, site/side was marked, verified correct patient position, special equipment/implants available, medications/allergies/relevant history reviewed, required imaging and test results available.   Sterile Technique Usual hand hygeine, masks, and gloves were used   Procedure Description Patient positioned in bed supine.  Sedation given as noted above.  Patient was intubated with endotracheal tube using Glidescope.  View was Grade 1 full glottis .  Number of attempts was 1.  Colorimetric CO2 detector was consistent with tracheal placement. Bilateral breath sounds were auscultated.    Complications/Tolerance None; patient tolerated the procedure well. Chest X-ray is ordered to verify placement.   EBL Minimal   Specimen(s) None  [name]., MSN, APRN, AGACNP-BC [facility]  09/12/2020 , 6:22 PM  Please see [email or web address] for pager details  If no response, please call [phone] After hours, please call Elink at [phone]

## 2020-09-12 NOTE — Progress Notes (Signed)
PT Cancellation Note  Patient Details Name: Andrea Shepherd MRN: [id number] DOB: 1934/07/15   Cancelled Treatment:    Reason Eval/Treat Not Completed: (P) Medical issues which prohibited therapy (pt with unstable vitals this AM and recent rapid response now on Bipap.) Will reassess later in day or next date per PT POC as schedule permits.   Kenzly Rogoff M Sonia Bromell 09/12/2020, 10:27 AM

## 2020-09-12 NOTE — Progress Notes (Signed)
Attending note: I have seen and examined the patient. History, labs and imaging reviewed.  86 year old with peripheral artery disease, hypertension, CKD stage IV, breast cancer admitted with left hip fracture after fall status post ORIF on 5/27, thrombectomy and endarterectomy on 5/28.  Then developed AKI requiring initiation of dialysis which she received the first session Has become more unresponsive today requiring BiPAP.  Rapid response and ICU team called for help with management  Blood pressure (!) 96/41, pulse 92, temperature (!) 94.9 F (34.9 C), temperature source Axillary, resp. rate (!) 24, height 5\' 8"  (1.727 m), weight 55.5 kg, SpO2 94 %. Gen:      No acute distress HEENT:  EOMI, sclera anicteric frail, elderly Neck:     No masses; no thyromegaly, ETT Lungs:    Clear to auscultation bilaterally; normal respiratory effort CV:         Regular rate and rhythm; no murmurs Abd:      + bowel sounds; soft, non-tender; no palpable masses, no distension Ext:    No edema; adequate peripheral perfusion, left hip wound VAC Skin:      Warm and dry; no rash Neuro: Unresponsive  Labs/Imaging personally reviewed, significant for BUN/creatinine 37/3.85, WBC 6.2, hemoglobin 13.4, platelets 221 Chest x-ray with bilateral effusion, bibasilar atelectasis  Assessment/plan: Acute kidney injury on CKD 4 secondary to ATN from contrast, shock Started on hemodialysis.  Will likely need to convert to CRRT as she is on pressors now  Acute respiratory failure, volume overload Right thoracentesis on 5/29.  Appears transudative Emergently intubated, follow chest x-ray, ABG Follow cultures, cytology Observe off antibiotic  Goals of care Attempting to reach husband.  Left hip fracture, left leg ischemia s/p endarterectomy Metacarpal fracture Management per surgery  The patient is critically ill with multiple organ systems failure and requires high complexity decision making for assessment and  support, frequent evaluation and titration of therapies, application of advanced monitoring technologies and extensive interpretation of multiple databases.  Critical care time - 35 mins. This represents my time independent of the NPs time taking care of the pt.  Marshell Garfinkel MD [facility] 09/12/2020, 6:21 PM

## 2020-09-12 NOTE — Significant Event (Addendum)
Rapid Response Event Note   Reason for Call :  Hypothermia, 94.24F  Initial Focused Assessment:  Pt lying in bed. Drowsy, oriented. On BiPAP 12/8 60%. Pt with non-sustained bradycardia episode this morning. Expiratory wheeze heard on auscultation, fine crackles in the bases. Skin is cool, dry, pink. Weeping noted to pt's arms bilaterally.   VS: T 94.24F rectally, BP 188/53, HR 68, RR 24, SpO2 96% on BiPAP  Interventions:  -Extra blankets and room temperature increased until Bare Hugger can be placed. -Recommend coordinating getting pt to HD, MD permitting.   Plan of Care:  -Bare hugger- q1h temperature checks until 1 hour post normothermia  -Dr. Avon Gully and Dr. Posey Pronto at bedside  -BiPAP PRN  Call rapid response for additional needs  Event Summary:   MD Notified: Dr. Avon Gully Call Time: 0820 Arrival Time: 0845 End Time: 0900  Andrea Bilis, RN

## 2020-09-12 NOTE — Progress Notes (Signed)
SLP Cancellation Note  Patient Details Name: Andrea Shepherd MRN: [id number] DOB: [birth year]   Cancelled treatment:       Reason Eval/Treat Not Completed: Medical issues which prohibited therapy. New orders received for swallow assessment (pt evaluated on Mon 5/30 with no dysphagia identified and regular diet recommended).  Currently on BiPAP and not appropriate for swallow assessment.  Will follow for readiness.  D/W RN.  Andrea Shepherd, [place] Office number [phone] Pager [phone]     Andrea Shepherd 09/12/2020, 8:58 AM

## 2020-09-12 NOTE — Progress Notes (Signed)
[facility] Progress Note Patient Name: Andrea Shepherd DOB: 1934/10/17 MRN: [id number]   Date of Service  09/12/2020  HPI/Events of Note  ABG results noted, patient in respiratory acidosis.  eICU Interventions  Respiratory rate increased to 26 by RT. Will order ABG for midnight.        Kerry Kass Wells Gerdeman 09/12/2020, 10:39 PM

## 2020-09-12 NOTE — Progress Notes (Signed)
Patient arrived to unit from 4E around 1815. Emergently intubated by CCM. Levophed and emergency meds placed through temp HD catheter after heparin was removed.   Joellen Jersey, RN

## 2020-09-12 NOTE — Procedures (Signed)
Central Venous Catheter Insertion Procedure Note  Andrea Shepherd  [id number]  27-Aug-1934  Date:09/12/20  Time:7:28 PM   Provider Performing:[name]   Procedure: Insertion of Non-tunneled Central Venous Catheter(36556) with US guidance (01093)   Indication(s) Medication administration  Consent Unable to obtain consent due to emergent nature of procedure.  Anesthesia Topical only with 1% lidocaine   Timeout Verified patient identification, verified procedure, site/side was marked, verified correct patient position, special equipment/implants available, medications/allergies/relevant history reviewed, required imaging and test results available.  Sterile Technique Maximal sterile technique including full sterile barrier drape, hand hygiene, sterile gown, sterile gloves, mask, hair covering, sterile ultrasound probe cover (if used).  Procedure Description Area of catheter insertion was cleaned with chlorhexidine and draped in sterile fashion.  With real-time ultrasound guidance a central venous catheter was placed into the left internal jugular vein. Nonpulsatile blood flow and easy flushing noted in all ports.  The catheter was sutured in place and sterile dressing applied.     Complications/Tolerance None; patient tolerated the procedure well. Chest X-ray is ordered to verify placement for internal jugular or subclavian cannulation.   Chest x-ray is not ordered for femoral cannulation.  EBL Minimal  Specimen(s) None  [name]., MSN, APRN, AGACNP-BC [facility]  09/12/2020 , 7:29 PM  Please see [email or web address] for pager details  If no response, please call [phone] After hours, please call Elink at [phone]

## 2020-09-12 NOTE — Progress Notes (Signed)
Temp 94.7 rectally w/ c/o being hot Lethargy and confusion on and off Elevated BP,   84% on 10L placed on bi-pap.  RR notified. MD notified. RT notified Orders pending

## 2020-09-12 NOTE — Progress Notes (Signed)
OT Cancellation Note  Patient Details Name: Andrea Shepherd MRN: [id number] DOB: 08/21/1934   Cancelled Treatment:    Reason Eval/Treat Not Completed: Medical issues which prohibited therapy (pt with unstable vitals this AM and recent rapid response now on Bipap.).    Jolaine Artist, OT Acute Rehabilitation Services Pager [phone] Office [phone]   Delight Stare 09/12/2020, 10:34 AM

## 2020-09-12 NOTE — Progress Notes (Signed)
[facility] Progress Note Patient Name: Navdeep Fessenden DOB: [birth year] MRN: [id number]   Date of Service  09/12/2020  HPI/Events of Note  Patient with profound hypotension after being started on Precedex and Fentanyl gtt.  eICU Interventions  X-ray reviewed to exclude pneumothorax, NS 500 ml iv bolus x 1, Norepinephrine infusion titrated up and Vasopressin added, stat labs, ABG and arterial line ordered, blood pressure bounced back with above measures and is currently 97/76 with MAP of 84.        Kerry Kass Larrell Rapozo 09/12/2020, 8:29 PM

## 2020-09-12 NOTE — Progress Notes (Signed)
Patient ID: Andrea Shepherd, female   DOB: [birth year], 86 y.o.   MRN: [id number] [facility] Progress Note   Assessment/ Plan:   1.  Acute kidney injury on chronic kidney disease stage IV: Anuric overnight with continued worsening of azotemia and seemingly plateaued creatinine.  Work-up indicates hemodynamically mediated renal injury from acute hemoglobin drop/hemodynamic fluctuations as well as contrast exposure for limb salvage procedure.  I appreciate Dr. Pascal Lux of radiology for placing her tunneled hemodialysis catheter yesterday.  She is on schedule for hemodialysis today for management of azotemia/volume overload. 2.  Hyperkalemia: Secondary to acute kidney injury.  Potassium level now rising again after initial improvement following medical management.  Monitor with dialysis. 3.  Anion gap metabolic acidosis: Secondary to acute kidney injury, improved with isotonic sodium bicarbonate-she is currently alkalemia and raises suspicion for underlying COPD. 4.  Left leg ischemia-acute on chronic status post redo left CFA endarterectomy/profundoplasty with bovine pericardial patch angioplasty and thrombectomy of the distal end left external iliac stent.  Has been assessed closely by vascular surgery and if has worsening ischemia, the next option will be left leg amputation. 5.  Left hip fracture: Following accidental fall and status post open management with intramedullary implant.  Awaiting PT/OT. 6.  Anemia: Likely from acute blood loss following hip fracture and losses associated with surgical intervention for left leg ischemia.  Hemoglobin and hematocrit within acceptable range.  Subjective:   With increasing dyspnea/work of breathing earlier this morning and placed on BiPAP for hypoxic respiratory failure.   Objective:   BP (!) 180/57 (BP Location: Right Arm)   Pulse 90   Temp 98.6 F (37 C) (Oral)   Resp 20   Ht _0  (1.727 m)   Wt 56.3 kg   LMP  (LMP Unknown)   SpO2 95%   BMI  18.87 kg/m   Intake/Output Summary (Last 24 hours) at 09/12/2020 0911 Last data filed at 09/11/2020 1200 Gross per 24 hour  Intake 0 ml  Output --  Net 0 ml   Weight change: 0.1 kg  Physical Exam: Gen: Chronically ill-appearing, on BiPAP CVS: Pulse regular rhythm, normal rate, S1 and S2 normal Resp: Fine rales bilaterally, no distinct wheeze/rhonchi Abd: Soft, flat, nontender Ext: Trace left lower extremity edema with visible mottling  Imaging: US RENAL  Result Date: 09/10/2020 CLINICAL DATA:  Chronic renal insufficiency EXAM: RENAL / URINARY TRACT ULTRASOUND COMPLETE COMPARISON:  01/09/2011 FINDINGS: Right Kidney: Renal measurements: 10.9 x 4.8 x 4.7 cm = volume: 128.4 mL. Diffuse increased renal cortical echotexture consistent with medical renal disease. Multiple right renal cysts are identified. The majority of the cysts are simple in appearance, largest in the lower pole measuring 2.6 cm. A minimally complex cyst within the upper pole right kidney measures 2.6 x 2.7 x 4.0 cm, with lobularity and a single thin septation. Previously this had measured 2.4 x 2.2 by 3.1 cm. No hydronephrosis or solid renal mass. Left Kidney: Renal measurements: 8.1 x 4.2 x 4.1 cm = volume: 71.6 mL. Diffuse increased renal echotexture consistent with medical renal disease. Marked cortical atrophy. No hydronephrosis or renal mass. Bladder: Appears normal for degree of bladder distention. Other: Incidental bilateral pleural effusions are seen, left greater than right. IMPRESSION: 1. Bilateral increased renal cortical echotexture consistent with medical renal disease. 2. Multiple right renal cysts. 3. Large bilateral pleural effusions. Electronically Signed   By: Randa Ngo M.D.   On: 09/10/2020 20:02   IR Fluoro Guide CV Line Right  Result  Date: 09/11/2020 INDICATION: End-stage renal disease. In need of durable intravenous access for initiation of hemodialysis. EXAM: TUNNELED CENTRAL VENOUS HEMODIALYSIS CATHETER  PLACEMENT WITH ULTRASOUND AND FLUOROSCOPIC GUIDANCE MEDICATIONS: The patient is currently admitted to the hospital and receiving intravenous antibiotics. The antibiotic was given in an appropriate time interval prior to skin puncture. ANESTHESIA/SEDATION: None FLUOROSCOPY TIME:  1 minute (4 mGy) COMPLICATIONS: None immediate. PROCEDURE: Informed written consent was obtained from the patient after a discussion of the risks, benefits, and alternatives to treatment. Questions regarding the procedure were encouraged and answered. The right neck and chest were prepped with chlorhexidine in a sterile fashion, and a sterile drape was applied covering the operative field. Maximum barrier sterile technique with sterile gowns and gloves were used for the procedure. A timeout was performed prior to the initiation of the procedure. After creating a small venotomy incision, a micropuncture kit was utilized to access the internal jugular vein. Real-time ultrasound guidance was utilized for vascular access including the acquisition of a permanent ultrasound image documenting patency of the accessed vessel. The microwire was utilized to measure appropriate catheter length. A stiff Glidewire was advanced to the level of the IVC and the micropuncture sheath was exchanged for a peel-away sheath. A palindrome tunneled hemodialysis catheter measuring 19 cm from tip to cuff was tunneled in a retrograde fashion from the anterior chest wall to the venotomy incision. The catheter was then placed through the peel-away sheath with tips ultimately positioned within the distal aspect of the SVC. Attempted aspiration from the arterial lumen resulting in sluggish flow despite fluoroscopic reposition of the dialysis catheter. As such, the existing 19 cm tip to cuff dialysis catheter was cannulated with a stiff Glidewire which was advanced to the level of the IVC. Under fluoroscopic guidance, a new dialysis catheter was exchanged for a slightly  larger now 23 cm tip to cuff dialysis catheter with tip now more ideally positioned within the superior aspect the right atrium. The catheter aspirates and flushes normally. The catheter was flushed with appropriate volume heparin dwells. The catheter exit site was secured with a 0-Prolene retention suture. The venotomy incision was closed with Dermabond and Steri-strips. Dressings were applied. The patient tolerated the procedure well without immediate post procedural complication. IMPRESSION: Successful placement of 23 cm tip to cuff tunneled hemodialysis catheter via the right internal jugular vein with tips terminating within the superior aspect of the right atrium. The catheter is ready for immediate use. Electronically Signed   By: Sandi Mariscal M.D.   On: 09/11/2020 14:59   IR US Guide Vasc Access Right  Result Date: 09/11/2020 INDICATION: End-stage renal disease. In need of durable intravenous access for initiation of hemodialysis. EXAM: TUNNELED CENTRAL VENOUS HEMODIALYSIS CATHETER PLACEMENT WITH ULTRASOUND AND FLUOROSCOPIC GUIDANCE MEDICATIONS: The patient is currently admitted to the hospital and receiving intravenous antibiotics. The antibiotic was given in an appropriate time interval prior to skin puncture. ANESTHESIA/SEDATION: None FLUOROSCOPY TIME:  1 minute (4 mGy) COMPLICATIONS: None immediate. PROCEDURE: Informed written consent was obtained from the patient after a discussion of the risks, benefits, and alternatives to treatment. Questions regarding the procedure were encouraged and answered. The right neck and chest were prepped with chlorhexidine in a sterile fashion, and a sterile drape was applied covering the operative field. Maximum barrier sterile technique with sterile gowns and gloves were used for the procedure. A timeout was performed prior to the initiation of the procedure. After creating a small venotomy incision, a micropuncture kit was utilized to  access the internal jugular  vein. Real-time ultrasound guidance was utilized for vascular access including the acquisition of a permanent ultrasound image documenting patency of the accessed vessel. The microwire was utilized to measure appropriate catheter length. A stiff Glidewire was advanced to the level of the IVC and the micropuncture sheath was exchanged for a peel-away sheath. A palindrome tunneled hemodialysis catheter measuring 19 cm from tip to cuff was tunneled in a retrograde fashion from the anterior chest wall to the venotomy incision. The catheter was then placed through the peel-away sheath with tips ultimately positioned within the distal aspect of the SVC. Attempted aspiration from the arterial lumen resulting in sluggish flow despite fluoroscopic reposition of the dialysis catheter. As such, the existing 19 cm tip to cuff dialysis catheter was cannulated with a stiff Glidewire which was advanced to the level of the IVC. Under fluoroscopic guidance, a new dialysis catheter was exchanged for a slightly larger now 23 cm tip to cuff dialysis catheter with tip now more ideally positioned within the superior aspect the right atrium. The catheter aspirates and flushes normally. The catheter was flushed with appropriate volume heparin dwells. The catheter exit site was secured with a 0-Prolene retention suture. The venotomy incision was closed with Dermabond and Steri-strips. Dressings were applied. The patient tolerated the procedure well without immediate post procedural complication. IMPRESSION: Successful placement of 23 cm tip to cuff tunneled hemodialysis catheter via the right internal jugular vein with tips terminating within the superior aspect of the right atrium. The catheter is ready for immediate use. Electronically Signed   By: Sandi Mariscal M.D.   On: 09/11/2020 14:59   DG Chest Port 1 View  Result Date: 09/11/2020 CLINICAL DATA:  Acute respiratory distress EXAM: PORTABLE CHEST 1 VIEW COMPARISON:  09/09/2020  FINDINGS: Cardiac shadow is within normal limits. Aortic calcifications are noted. There is been interval placement of a right jugular dialysis catheter in satisfactory position. Bilateral pleural effusions are noted. Left retrocardiac consolidation is again noted and stable. No pneumothorax is seen. IMPRESSION: No pneumothorax following dialysis catheter placement. Bilateral effusions and left retrocardiac consolidation stable from the prior study. Electronically Signed   By: Inez Catalina M.D.   On: 09/11/2020 19:37    Labs: BMET Recent Labs  Lab 09/08/20 0500 09/09/20 1143 09/10/20 0108 09/10/20 0823 09/10/20 1653 09/11/20 0242 09/11/20 2045 09/12/20 0231  NA 137 137 136  --  139 136 136 136  K 5.1 5.3* 5.7* 5.4* 5.2* 5.2* 4.7 5.0  CL 109 110 109  --  108 101 98 99  CO2 17* 19* 16*  --  19* 20* 21* 27  GLUCOSE 120* 101* 105*  --  114* 131* 105* 132*  BUN 54* 65* 71*  --  70* 73* 74* 77*  CREATININE 3.08* 3.51* 3.70*  --  3.76* 3.74* 3.76* 3.85*  CALCIUM 8.0* 7.9* 8.1*  --  8.3* 8.2* 8.2* 8.4*  PHOS  --   --   --   --   --  6.4*  --  6.5*   CBC Recent Labs  Lab 09/08/20 0500 09/09/20 0228 09/11/20 2045 09/12/20 0231  WBC 7.6 10.2 7.0 6.2  HGB 13.1 13.4 14.0 13.4  HCT 38.5 39.4 42.0 41.7  MCV 91.2 91.6 93.1 94.8  PLT 171 188 194 221    Medications:    . sodium chloride   Intravenous Once  . sodium chloride   Intravenous Once  . atorvastatin  40 mg Oral q1800  . carvedilol  6.25 mg Oral BID WC  . Chlorhexidine Gluconate Cloth  6 each Topical Daily  . clopidogrel  75 mg Oral Daily  . docusate sodium  100 mg Oral BID  . feeding supplement  237 mL Oral TID BM  . gabapentin  100 mg Oral QHS  . hydrALAZINE  50 mg Oral Q8H  . multivitamin with minerals  1 tablet Oral Daily  . nystatin  5 mL Oral QID  . polyethylene glycol  17 g Oral Daily   Elmarie Shiley, MD 09/12/2020, 9:11 AM

## 2020-09-12 NOTE — Consult Note (Signed)
NAME:  Andrea Shepherd, MRN:  [id number], DOB:  October 15, 1934, LOS: 7 ADMISSION DATE:  08/17/2020, CONSULTATION DATE:  09/12/2020 REFERRING MD:  Dr, Urbano Heir, CHIEF COMPLAINT:  [name]    History of Present Illness:  Andrea Shepherd is a 86 y.o. with a past medical history significant for hyperlipidemia, PAD, CKD stage IV, tobacco abuse, hypertension, and bilateral carotid stenosis who presented after suffering mechanical fall and was found to have left hip fracture. She is now statu post ORIF 5/27. Since orthopedic repair patient unfortunately suffered critical limb ischemia that required recannulization with thrombectomy and enterectomy completed 5/28.   Overnight of 6/1 patient became hypoxia with signs of uremia and volume overload therefore Nephrology asked IR to place tunneled HD cath. Patient had first HD session 6/2. On return from HD patient was seen minimally responsive, hypotensive, and hypoglycemic. Rapid response was called and PCCM consulted for further management.   Pertinent  Medical History  Hyperlipidemia, PAD, CKD stage IV, tobacco abuse, hypertension, and bilateral carotid stenosis who presented   Significant Hospital Events: Including procedures, antibiotic start and stop dates in addition to other pertinent events   . 5/27 admitted after mechanical fall resulting in left hip fracture s/p ORIF to left hip . 5/28  critical limb ischemia that required recannulization with thrombectomy and enterectomy completed 5/28.  . 5/29 right thoracentesis with 659ml removed  . 6/2 tunneled HD cath placed and first HD session complete. Post HD patient was seen minimally responsive, hypotensive, and hypoglycemic. Transfered to ICU for intubation   Interim History / Subjective:  As above  Objective   Blood pressure (!) 96/41, pulse 92, temperature (!) 94.9 F (34.9 C), temperature source Axillary, resp. rate (!) 24, height 5\' 8"  (1.727 m), weight 55.5 kg, SpO2 94 %.    FiO2 (%):  [60 %] 60 %    Intake/Output Summary (Last 24 hours) at 09/12/2020 1756 Last data filed at 09/12/2020 1449 Gross per 24 hour  Intake --  Output 66 ml  Net -66 ml   Filed Weights   09/12/20 0700 09/12/20 1210 09/12/20 1449  Weight: 56.3 kg 55.6 kg 55.5 kg    Examination: General: Acute on chronically ill appearing elderly female on BIPAP, in acute distress HEENT: [place]/AT, BIPAP MM pink/moist, PERRL,  Neuro: minimally responsive, briefly opens eyes to noxious stimuli   CV: s1s2 regular rate and rhythm, no murmur, rubs, or gallops,  PULM:  BIPAP In place with mechanical  sounds, diminished thoughtout  GI: soft, bowel sounds active in all 4 quadrants, non-tender, non-distended,  Extremities: warm/dry, no edema, postop ORIF  Skin: no rashes or lesions  Labs/imaging that I havepersonally reviewed    Echocardiogram significant for a normal EF of 60-65% with grade 1 diastolic dysfunction and small pericardial effusion.   Resolved Hospital Problem list     Assessment & Plan:  Acute Hypoxic Respiratory Failure  -now BiPAP with transfer to ICU for intubation  P: Continue ventilator support with lung protective strategies  Wean PEEP and FiO2 for sats greater than 90%. Head of bed elevated 30 degrees. Plateau pressures less than 30 cm H20.  Follow intermittent chest x-ray and ABG.   SAT/SBT as tolerated, mentation preclude extubation  Ensure adequate pulmonary hygiene  Follow cultures  VAP bundle in place  PAD protocol  AKI on CKD IV Hyperkalemia  AGMA -S/p tunneled HD cath placement per IR, First HD session 6/2 P: Nephrology consulted, appreciate assistance Volume removal per dialysis  Follow renal function  Monitor urine  output Trend Bmet Avoid nephrotoxins Ensure adequate renal perfusion  Acute diastolic congestive heart failure  Volume overload in te setting of CHF and ARF Bilateral pleural effusion  -Right thoracentesis with 620ml removed  P: Further volume removal per HD will  likely need CRRT Follow pleural fluids  Strict intake and output  Daily weight  ECHO as above   Anemia  -Suspect in setting of acute blood loss, CKD  P: Transfuse per protocol  HG goal >7 Trend CBC   Left Leg ischemia  PAD -Acute on chronic s/p redo of left CFA endarterectomy / profundoplasty with bovine pericardial patch angioplasty & thrombectomy of the distal end left external iliac stent P: Followed by [facility], next options would be amputation Supportive care   Left Hip Fracture  -S/p fall PTA, open IM implant P: PT / OT as able   Best practice   Diet:  NPO Pain/Anxiety/Delirium protocol (if indicated): Yes (RASS goal -1) VAP protocol (if indicated): Yes DVT prophylaxis: Subcutaneous Heparin GI prophylaxis: N/A Glucose control:  SSI Yes Central venous access:  N/A Arterial line:  N/A Foley:  N/A Mobility:  bed rest  PT consulted: N/A Last date of multidisciplinary goals of care discussion Pending Code Status:  full code Disposition: ICU  Labs   CBC: Recent Labs  Lab 08/28/2020 0140 08/29/2020 1449 08/25/2020 2128 09/08/20 0500 09/09/20 0228 09/11/20 2045 09/12/20 0231  WBC 8.6  --   --  7.6 10.2 7.0 6.2  HGB 7.1*   < > 9.2* 13.1 13.4 14.0 13.4  HCT 21.8*   < > 27.0* 38.5 39.4 42.0 41.7  MCV 102.8*  --   --  91.2 91.6 93.1 94.8  PLT 247  --   --  171 188 194 221   < > = values in this interval not displayed.    Basic Metabolic Panel: Recent Labs  Lab 09/10/20 0108 09/10/20 0823 09/10/20 1653 09/11/20 0242 09/11/20 2045 09/12/20 0231  NA 136  --  139 136 136 136  K 5.7* 5.4* 5.2* 5.2* 4.7 5.0  CL 109  --  108 101 98 99  CO2 16*  --  19* 20* 21* 27  GLUCOSE 105*  --  114* 131* 105* 132*  BUN 71*  --  70* 73* 74* 77*  CREATININE 3.70*  --  3.76* 3.74* 3.76* 3.85*  CALCIUM 8.1*  --  8.3* 8.2* 8.2* 8.4*  PHOS  --   --   --  6.4*  --  6.5*   GFR: Estimated Creatinine Clearance: 9.7 mL/min (A) (by C-G formula based on SCr of 3.85 mg/dL (H)). Recent  Labs  Lab 09/08/20 0500 09/09/20 0228 09/11/20 2045 09/12/20 0231  WBC 7.6 10.2 7.0 6.2    Liver Function Tests: Recent Labs  Lab 09/08/20 1649 09/11/20 0242 09/12/20 0231  PROT 3.5*  --   --   ALBUMIN  --  1.8* 1.7*   No results for input(s): LIPASE, AMYLASE in the last 168 hours. No results for input(s): AMMONIA in the last 168 hours.  ABG    Component Value Date/Time   PHART 7.424 09/11/2020 1954   PCO2ART 44.6 09/11/2020 1954   PO2ART 35.3 (LL) 09/11/2020 1954   HCO3 28.7 (H) 09/11/2020 1954   TCO2 21 (L) 09/09/2020 2128   ACIDBASEDEF 6.0 (H) 08/31/2020 2022   O2SAT 62.1 09/11/2020 1954     Coagulation Profile: Recent Labs  Lab 09/11/20 2054  INR 0.9    Cardiac Enzymes: No results for input(s):  CKTOTAL, CKMB, CKMBINDEX, TROPONINI in the last 168 hours.  HbA1C: No results found for: HGBA1C  CBG: Recent Labs  Lab 09/12/20 1738 09/12/20 1753  GLUCAP 58* 207*    Review of Systems:   Unable to asses given acute illness and AMS  Past Medical History:  She,  has a past medical history of Age-related osteoporosis without current pathological fracture, Essential (primary) hypertension, History of malignant neoplasm of breast, Impaired fasting glucose, Mixed hyperlipidemia, and Occlusion and stenosis of bilateral carotid arteries.   Surgical History:   Past Surgical History:  Procedure Laterality Date  . AORTOGRAM Right 09/05/2020   Procedure: AORTOGRAM;  Surgeon: Marty Heck, MD;  Location: [facility];  Service: Vascular;  Laterality: Right;  . BREAST LUMPECTOMY  2010   right lower  . CAROTID ENDARTERECTOMY     Left  . CATARACT EXTRACTION Right 2016  . ENDARTERECTOMY FEMORAL Left 08/29/2020   Procedure: REDO FERMORAL ENDARTERECTOMY WITH PROFUNDAPLASTY;  Surgeon: Marty Heck, MD;  Location: [facility];  Service: Vascular;  Laterality: Left;  . FEMORAL ARTERY STENT Right    04/2015  . FEMORAL BYPASS Bilateral    Left done 06/25/2015 and right  done 07/2015  . INTRAMEDULLARY (IM) NAIL INTERTROCHANTERIC Left 08/19/2020   Procedure: INTRAMEDULLARY (IM) NAIL INTERTROCHANTRIC;  Surgeon: Leandrew Koyanagi, MD;  Location: [facility];  Service: Orthopedics;  Laterality: Left;  . INTRAOPERATIVE ARTERIOGRAM Right 08/26/2020   Procedure: INTRA OPERATIVE ARTERIOGRAM LEFT LOWER LEG;  Surgeon: Marty Heck, MD;  Location: [facility];  Service: Vascular;  Laterality: Right;  . IR FLUORO GUIDE CV LINE RIGHT  09/11/2020  . IR US GUIDE VASC ACCESS RIGHT  09/11/2020  . left tumor removed from neck    . PATCH ANGIOPLASTY Left 08/28/2020   Procedure: PATCH ANGIOPLASTY USING XENOSURE BIOLOGIC PATCH 1cm x 14cm;  Surgeon: Marty Heck, MD;  Location: [facility];  Service: Vascular;  Laterality: Left;  . ULTRASOUND GUIDANCE FOR VASCULAR ACCESS Right 08/18/2020   Procedure: ULTRASOUND GUIDANCE FOR VASCULAR ACCESS FEMORAL ARTERY;  Surgeon: Marty Heck, MD;  Location: [facility];  Service: Vascular;  Laterality: Right;     Social History:   reports that she has been smoking cigarettes. She has never used smokeless tobacco. She reports previous alcohol use. She reports that she does not use drugs.   Family History:  Her family history includes Cancer in her father and sister.   Allergies Allergies  Allergen Reactions  . Sulfamethoxazole-Trimethoprim Diarrhea and Other (See Comments)    UNKNOWN REACTION   . Nitrofurantoin Diarrhea and Other (See Comments)    UNKNOWN REACTION      Home Medications  Prior to Admission medications   Medication Sig Start Date End Date Taking? Authorizing Provider  acetaminophen (TYLENOL) 500 MG tablet Take 1,000 mg by mouth every 6 (six) hours as needed for moderate pain or headache.   Yes [provider]  acyclovir (ZOVIRAX) 400 MG tablet TAKE ONE TABLET BY MOUTH THREE TIMES DAILY Patient taking differently: Take 400 mg by mouth See admin instructions. Tid x 7 days. Once a month, takes at the beginning of the month  08/19/20  Yes Cox, Kirsten, MD  clopidogrel (PLAVIX) 75 MG tablet Take 1 tablet (75 mg total) by mouth daily. 06/21/20  Yes Rip Harbour, NP  diltiazem (TIAZAC) 120 MG 24 hr capsule Take 1 capsule (120 mg total) by mouth 2 (two) times daily. 06/21/20  Yes Rip Harbour, NP  hydroxypropyl methylcellulose / hypromellose (  ISOPTO TEARS / GONIOVISC) 2.5 % ophthalmic solution Place 1 drop into both eyes daily.   Yes [provider]  oxyCODONE-acetaminophen (PERCOCET) 5-325 MG tablet Take 1-2 tablets by mouth every 8 (eight) hours as needed for severe pain. 09/10/2020  Yes Leandrew Koyanagi, MD  simvastatin (ZOCOR) 20 MG tablet TAKE ONE TABLET BY MOUTH AT BEDTIME Patient taking differently: Take 20 mg by mouth at bedtime. 10/09/19  Yes Lillard Anes, MD  hydrochlorothiazide (MICROZIDE) 12.5 MG capsule Take 1 capsule (12.5 mg total) by mouth 2 (two) times daily. Patient not taking: No sig reported 07/22/20   Lillard Anes, MD  Vitamin D, Ergocalciferol, (DRISDOL) 1.25 MG (50000 UNIT) CAPS capsule Take 1 capsule (50,000 Units total) by mouth 2 (two) times a week. Take 1 capsule twice weekly Patient not taking: Reported on 08/14/2020 07/11/20   Rochel Brome, MD     Critical care time:    Performed by: Johnsie Cancel  Total critical care time: 55 minutes  Critical care time was exclusive of separately billable procedures and treating other patients.  Critical care was necessary to treat or prevent imminent or life-threatening deterioration.  Critical care was time spent personally by me on the following activities: development of treatment plan with patient and/or surrogate as well as nursing, discussions with consultants, evaluation of patient's response to treatment, examination of patient, obtaining history from patient or surrogate, ordering and performing treatments and interventions, ordering and review of laboratory studies, ordering and review of radiographic studies, pulse  oximetry and re-evaluation of patient's condition.  Johnsie Cancel, NP-C [facility] Personal contact information can be found on Amion  09/12/2020, 6:26 PM

## 2020-09-12 NOTE — Progress Notes (Addendum)
Progress Note    09/12/2020 7:47 AM 5 Days Post-Op  Subjective:  States she was feeling good early this morning and now she says she is "not good at all". Says her breathing is difficult and just doesn't feel well    Vitals:   09/12/20 0500 09/12/20 0700  BP: (!) 180/70 (!) 181/85  Pulse: 81 90  Resp: 15 (!) 22  Temp:    SpO2: 98% 95%   Physical Exam: Cardiac:  regular Lungs: somewhat labored, on 3L [place] Incisions:  Left groin incision with Prevena Vac to suction Extremities:  2+ femoral pulses bilaterally, right lower extremity well perfused with Doppler PT and DP signals, right foot warm. Left foot cool and mottled. Able to wiggle toes. Tender to palpation. Monophasic PT signal Neurologic: alert and oriented  CBC    Component Value Date/Time   WBC 6.2 09/12/2020 0231   RBC 4.40 09/12/2020 0231   HGB 13.4 09/12/2020 0231   HGB 11.9 06/21/2020 1024   HCT 41.7 09/12/2020 0231   HCT 32.9 (L) 06/21/2020 1024   PLT 221 09/12/2020 0231   PLT 311 06/21/2020 1024   MCV 94.8 09/12/2020 0231   MCV 97 06/21/2020 1024   MCH 30.5 09/12/2020 0231   MCHC 32.1 09/12/2020 0231   RDW 16.5 (H) 09/12/2020 0231   RDW 13.5 06/21/2020 1024   LYMPHSABS 1.0 08/22/2020 2205   LYMPHSABS 1.0 06/21/2020 1024   MONOABS 0.6 08/21/2020 2205   EOSABS 0.0 08/13/2020 2205   EOSABS 0.1 06/21/2020 1024   BASOSABS 0.0 08/21/2020 2205   BASOSABS 0.0 06/21/2020 1024    BMET    Component Value Date/Time   NA 136 09/12/2020 0231   NA 142 06/21/2020 1024   K 5.0 09/12/2020 0231   CL 99 09/12/2020 0231   CO2 27 09/12/2020 0231   GLUCOSE 132 (H) 09/12/2020 0231   BUN 77 (H) 09/12/2020 0231   BUN 39 (H) 06/21/2020 1024   CREATININE 3.85 (H) 09/12/2020 0231   CALCIUM 8.4 (L) 09/12/2020 0231   GFRNONAA 11 (L) 09/12/2020 0231    INR    Component Value Date/Time   INR 0.9 09/11/2020 2054     Intake/Output Summary (Last 24 hours) at 09/12/2020 0747 Last data filed at 09/11/2020 1200 Gross per 24  hour  Intake 20 ml  Output --  Net 20 ml     Assessment/Plan:  86 y.o. female is s/p  redo left common femoral endarterectomy with profundoplasty and bovine pericardial patchangioplastyincluding thrombectomy of the distal left external iliac stent  5 Days Post-Op. Left foot is essentially unchanged. Still cool and some mottling present. She can still wiggle her toes and pain seems tolerable. She does have monophasic PT doppler signal. Remains at high risk for amputation but no urgency for this presently. Continue Asa, statin and Plavix.  Medical management per primary team and HD per Nephrology    Karoline Caldwell, [facility] [phone] 09/12/2020 7:47 AM   I have seen and evaluated the patient. I agree with the PA note as documented above. Now postop day 5 status post redo left common femoral endarterectomy with profundoplasty and thrombectomy of the distal external iliac stent on the left.  I can still find a monophasic PT signal in the left foot this morning which is what she had after revascularization Saturday night.  Mottling in the leg still looks better compared to Saturday.  She is still able to wiggle her toes.  Keep Prevena VAC in  the left groin for serous drainage.  As previously discussed she has no further options for revascularization and would require above-knee amputation if she had progressive tissue loss or worsening pain.  Looks like got [facility] yesterday and plan to start HD.  Marty Heck, MD [facility] Office: [phone]

## 2020-09-12 NOTE — Progress Notes (Signed)
RT transported patient from HD to 4E11 with RN. No complications noted. Vitals stable. RT placed patient on 4L [place] and patient tolerating well at this time. RT will continue to monitor as needed.

## 2020-09-12 NOTE — Progress Notes (Signed)
ABG was successful  once volume deficits were corrected and vasoactive drug kicked in  resulting in normotensive at this time.

## 2020-09-12 NOTE — Progress Notes (Addendum)
Pt unresponsive and BP 75/44 (54). O2 95%. CBG 58. MD paged and orders received for 500c bolus and dextrose. Medications administered per order. Pt placed back on BiPap. Rapid RN and RT at bedside.  Clyde Canterbury, RN

## 2020-09-13 ENCOUNTER — Inpatient Hospital Stay (HOSPITAL_COMMUNITY): Payer: Medicare Other

## 2020-09-13 DIAGNOSIS — E872 Acidosis, unspecified: Secondary | ICD-10-CM

## 2020-09-13 DIAGNOSIS — R579 Shock, unspecified: Secondary | ICD-10-CM | POA: Diagnosis not present

## 2020-09-13 DIAGNOSIS — G9340 Encephalopathy, unspecified: Secondary | ICD-10-CM

## 2020-09-13 DIAGNOSIS — S72002D Fracture of unspecified part of neck of left femur, subsequent encounter for closed fracture with routine healing: Secondary | ICD-10-CM | POA: Diagnosis not present

## 2020-09-13 DIAGNOSIS — J80 Acute respiratory distress syndrome: Secondary | ICD-10-CM

## 2020-09-13 DIAGNOSIS — N179 Acute kidney failure, unspecified: Secondary | ICD-10-CM | POA: Diagnosis not present

## 2020-09-13 DIAGNOSIS — I469 Cardiac arrest, cause unspecified: Secondary | ICD-10-CM

## 2020-09-13 LAB — CBC WITH DIFFERENTIAL/PLATELET
Abs Immature Granulocytes: 0.03 10*3/uL (ref 0.00–0.07)
Basophils Absolute: 0 10*3/uL (ref 0.0–0.1)
Basophils Relative: 0 %
Eosinophils Absolute: 0 10*3/uL (ref 0.0–0.5)
Eosinophils Relative: 0 %
HCT: 25.2 % — ABNORMAL LOW (ref 36.0–46.0)
Hemoglobin: 7.5 g/dL — ABNORMAL LOW (ref 12.0–15.0)
Immature Granulocytes: 8 %
Lymphocytes Relative: 56 %
Lymphs Abs: 0.2 10*3/uL — ABNORMAL LOW (ref 0.7–4.0)
MCH: 31.1 pg (ref 26.0–34.0)
MCHC: 29.8 g/dL — ABNORMAL LOW (ref 30.0–36.0)
MCV: 104.6 fL — ABNORMAL HIGH (ref 80.0–100.0)
Monocytes Absolute: 0 10*3/uL — ABNORMAL LOW (ref 0.1–1.0)
Monocytes Relative: 3 %
Neutro Abs: 0.1 10*3/uL — CL (ref 1.7–7.7)
Neutrophils Relative %: 33 %
Platelets: 72 10*3/uL — ABNORMAL LOW (ref 150–400)
RBC: 2.41 MIL/uL — ABNORMAL LOW (ref 3.87–5.11)
RDW: 16.5 % — ABNORMAL HIGH (ref 11.5–15.5)
WBC: 0.4 10*3/uL — CL (ref 4.0–10.5)
nRBC: 10.3 % — ABNORMAL HIGH (ref 0.0–0.2)

## 2020-09-13 LAB — RENAL FUNCTION PANEL
Albumin: 1.1 g/dL — ABNORMAL LOW (ref 3.5–5.0)
Anion gap: 14 (ref 5–15)
BUN: 44 mg/dL — ABNORMAL HIGH (ref 8–23)
CO2: 22 mmol/L (ref 22–32)
Calcium: 7.3 mg/dL — ABNORMAL LOW (ref 8.9–10.3)
Chloride: 106 mmol/L (ref 98–111)
Creatinine, Ser: 2.85 mg/dL — ABNORMAL HIGH (ref 0.44–1.00)
GFR, Estimated: 16 mL/min — ABNORMAL LOW (ref >60)
Glucose, Bld: 38 mg/dL — CL (ref 70–99)
Phosphorus: 8.3 mg/dL — ABNORMAL HIGH (ref 2.5–4.6)
Potassium: 6.6 mmol/L (ref 3.5–5.1)
Sodium: 142 mmol/L (ref 135–145)

## 2020-09-13 LAB — POCT I-STAT 7, (LYTES, BLD GAS, ICA,H+H)
Acid-base deficit: 8 mmol/L — ABNORMAL HIGH (ref 0.0–2.0)
Acid-base deficit: 6 mmol/L — ABNORMAL HIGH (ref 0.0–2.0)
Bicarbonate: 19.9 mmol/L — ABNORMAL LOW (ref 20.0–28.0)
Bicarbonate: 22.8 mmol/L (ref 20.0–28.0)
Calcium, Ion: 1.08 mmol/L — ABNORMAL LOW (ref 1.15–1.40)
Calcium, Ion: 0.9 mmol/L — ABNORMAL LOW (ref 1.15–1.40)
HCT: 31 % — ABNORMAL LOW (ref 36.0–46.0)
HCT: 19 % — ABNORMAL LOW (ref 36.0–46.0)
Hemoglobin: 10.5 g/dL — ABNORMAL LOW (ref 12.0–15.0)
Hemoglobin: 6.5 g/dL — CL (ref 12.0–15.0)
O2 Saturation: 92 %
O2 Saturation: 57 %
Patient temperature: 97.4
Patient temperature: 33.1
Potassium: 4 mmol/L (ref 3.5–5.1)
Potassium: 4.6 mmol/L (ref 3.5–5.1)
Sodium: 139 mmol/L (ref 135–145)
Sodium: 142 mmol/L (ref 135–145)
TCO2: 22 mmol/L (ref 22–32)
TCO2: 25 mmol/L (ref 22–32)
pCO2 arterial: 51.5 mmHg — ABNORMAL HIGH (ref 32.0–48.0)
pCO2 arterial: 57.4 mmHg — ABNORMAL HIGH (ref 32.0–48.0)
pH, Arterial: 7.193 — CL (ref 7.350–7.450)
pH, Arterial: 7.183 — CL (ref 7.350–7.450)
pO2, Arterial: 77 mmHg — ABNORMAL LOW (ref 83.0–108.0)
pO2, Arterial: 30 mmHg — CL (ref 83.0–108.0)

## 2020-09-13 LAB — BASIC METABOLIC PANEL
Anion gap: 8 (ref 5–15)
Anion gap: 18 — ABNORMAL HIGH (ref 5–15)
BUN: 44 mg/dL — ABNORMAL HIGH (ref 8–23)
BUN: 41 mg/dL — ABNORMAL HIGH (ref 8–23)
CO2: 24 mmol/L (ref 22–32)
CO2: 20 mmol/L — ABNORMAL LOW (ref 22–32)
Calcium: 7.4 mg/dL — ABNORMAL LOW (ref 8.9–10.3)
Calcium: 6.1 mg/dL — CL (ref 8.9–10.3)
Chloride: 106 mmol/L (ref 98–111)
Chloride: 102 mmol/L (ref 98–111)
Creatinine, Ser: 2.79 mg/dL — ABNORMAL HIGH (ref 0.44–1.00)
Creatinine, Ser: 2.8 mg/dL — ABNORMAL HIGH (ref 0.44–1.00)
GFR, Estimated: 16 mL/min — ABNORMAL LOW (ref >60)
GFR, Estimated: 16 mL/min — ABNORMAL LOW (ref >60)
Glucose, Bld: 89 mg/dL (ref 70–99)
Glucose, Bld: 326 mg/dL — ABNORMAL HIGH (ref 70–99)
Potassium: 4.5 mmol/L (ref 3.5–5.1)
Potassium: 6.4 mmol/L (ref 3.5–5.1)
Sodium: 138 mmol/L (ref 135–145)
Sodium: 140 mmol/L (ref 135–145)

## 2020-09-13 LAB — GLUCOSE, CAPILLARY
Glucose-Capillary: 216 mg/dL — ABNORMAL HIGH (ref 70–99)
Glucose-Capillary: 27 mg/dL — CL (ref 70–99)
Glucose-Capillary: 361 mg/dL — ABNORMAL HIGH (ref 70–99)

## 2020-09-13 LAB — PATHOLOGIST SMEAR REVIEW

## 2020-09-13 LAB — HEPATIC FUNCTION PANEL
ALT: 5 U/L (ref 0–44)
AST: 44 U/L — ABNORMAL HIGH (ref 15–41)
Albumin: 1.7 g/dL — ABNORMAL LOW (ref 3.5–5.0)
Alkaline Phosphatase: 39 U/L (ref 38–126)
Bilirubin, Direct: 0.1 mg/dL (ref 0.0–0.2)
Indirect Bilirubin: 0.5 mg/dL (ref 0.3–0.9)
Total Bilirubin: 0.6 mg/dL (ref 0.3–1.2)
Total Protein: 3.9 g/dL — ABNORMAL LOW (ref 6.5–8.1)

## 2020-09-13 LAB — CBC
HCT: 37.7 % (ref 36.0–46.0)
Hemoglobin: 11.7 g/dL — ABNORMAL LOW (ref 12.0–15.0)
MCH: 31.1 pg (ref 26.0–34.0)
MCHC: 31 g/dL (ref 30.0–36.0)
MCV: 100.3 fL — ABNORMAL HIGH (ref 80.0–100.0)
Platelets: 162 10*3/uL (ref 150–400)
RBC: 3.76 MIL/uL — ABNORMAL LOW (ref 3.87–5.11)
RDW: 16.4 % — ABNORMAL HIGH (ref 11.5–15.5)
WBC: 1.8 10*3/uL — ABNORMAL LOW (ref 4.0–10.5)
nRBC: 4.6 % — ABNORMAL HIGH (ref 0.0–0.2)

## 2020-09-13 LAB — CULTURE, BODY FLUID W GRAM STAIN -BOTTLE: Culture: NO GROWTH

## 2020-09-13 LAB — MAGNESIUM: Magnesium: 2.2 mg/dL (ref 1.7–2.4)

## 2020-09-13 LAB — AMMONIA: Ammonia: 29 umol/L (ref 9–35)

## 2020-09-13 MED ORDER — DEXTROSE 10 % IV SOLN: INTRAVENOUS | Status: DC

## 2020-09-13 MED ORDER — PRISMASOL BGK 0/2.5 32-2.5 MEQ/L EC SOLN
Status: DC
  Filled 2020-09-13 (×2): qty 5000

## 2020-09-13 MED ORDER — SODIUM BICARBONATE 8.4 % IV SOLN
INTRAVENOUS | Status: DC
  Filled 2020-09-13 (×2): qty 1000

## 2020-09-13 MED ORDER — CALCIUM GLUCONATE-NACL 1-0.675 GM/50ML-% IV SOLN
1.0000 g | Freq: Once | INTRAVENOUS | Status: AC
  Administered 2020-09-13: 1000 mg via INTRAVENOUS
  Filled 2020-09-13: qty 50

## 2020-09-13 MED ORDER — EPINEPHRINE HCL 5 MG/250ML IV SOLN IN NS
0.5000 ug/min | INTRAVENOUS | Status: DC
  Filled 2020-09-13 (×2): qty 250

## 2020-09-13 MED ORDER — HEPARIN SOD (PORK) LOCK FLUSH 100 UNIT/ML IV SOLN
250.0000 [IU] | INTRAVENOUS | Status: DC | PRN
  Filled 2020-09-13: qty 2.5

## 2020-09-13 MED ORDER — EPINEPHRINE HCL 5 MG/250ML IV SOLN IN NS
INTRAVENOUS | Status: AC
  Administered 2020-09-13: 25 ug/min via INTRAVENOUS
  Filled 2020-09-13: qty 250

## 2020-09-13 MED ORDER — SODIUM BICARBONATE 8.4 % IV SOLN
50.0000 meq | Freq: Once | INTRAVENOUS | Status: AC
  Administered 2020-09-13: 50 meq via INTRAVENOUS
  Filled 2020-09-13: qty 50

## 2020-09-13 MED ORDER — PRISMASOL BGK 0/2.5 32-2.5 MEQ/L EC SOLN
Status: DC
  Filled 2020-09-13 (×5): qty 5000

## 2020-09-13 MED ORDER — VASOPRESSIN 20 UNITS/100 ML INFUSION FOR SHOCK
0.0000 [IU]/min | INTRAVENOUS | Status: DC
  Administered 2020-09-13: 0.04 [IU]/min via INTRAVENOUS
  Filled 2020-09-13: qty 100

## 2020-09-13 MED ORDER — SODIUM BICARBONATE 8.4 % IV SOLN
INTRAVENOUS | Status: AC
  Filled 2020-09-13: qty 100

## 2020-09-13 MED ORDER — DEXTROSE 50 % IV SOLN
1.0000 | Freq: Once | INTRAVENOUS | Status: AC
  Administered 2020-09-13: 50 mL via INTRAVENOUS
  Filled 2020-09-13: qty 50

## 2020-09-13 MED ORDER — HEPARIN SODIUM (PORCINE) 1000 UNIT/ML DIALYSIS: 1000.0000 [IU] | INTRAMUSCULAR | Status: DC | PRN

## 2020-09-13 MED ORDER — HEPARIN (PORCINE) 2000 UNITS/L FOR CRRT: INTRAVENOUS_CENTRAL | Status: DC | PRN

## 2020-09-13 MED ORDER — HEPARIN SOD (PORK) LOCK FLUSH 100 UNIT/ML IV SOLN
250.0000 [IU] | Freq: Every day | INTRAVENOUS | Status: DC
  Filled 2020-09-13: qty 2.5

## 2020-09-13 MED ORDER — HEPARIN SOD (PORK) LOCK FLUSH 1 UNIT/ML IV SOLN: 0.5000 mL | INTRAVENOUS | Status: DC | PRN

## 2020-09-13 MED ORDER — INSULIN ASPART 100 UNIT/ML IV SOLN: 5.0000 [IU] | Freq: Once | INTRAVENOUS | Status: DC

## 2020-09-13 MED ORDER — DEXTROSE 50 % IV SOLN
25.0000 g | INTRAVENOUS | Status: AC
  Administered 2020-09-13: 25 g via INTRAVENOUS

## 2020-09-13 MED ORDER — ORAL CARE MOUTH RINSE: 15.0000 mL | OROMUCOSAL | Status: DC

## 2020-09-13 MED ORDER — SODIUM BICARBONATE 8.4 % IV SOLN
INTRAVENOUS | Status: AC
  Administered 2020-09-13: 50 meq
  Filled 2020-09-13: qty 50

## 2020-09-13 MED ORDER — CHLORHEXIDINE GLUCONATE 0.12% ORAL RINSE (MEDLINE KIT)
15.0000 mL | Freq: Two times a day (BID) | OROMUCOSAL | Status: DC
  Administered 2020-09-13: 15 mL via OROMUCOSAL

## 2020-09-13 MED ORDER — SODIUM ZIRCONIUM CYCLOSILICATE 10 G PO PACK
10.0000 g | PACK | Freq: Once | ORAL | Status: AC
  Administered 2020-09-13: 10 g
  Filled 2020-09-13: qty 1

## 2020-09-13 MED ORDER — SODIUM BICARBONATE 8.4 % IV SOLN
INTRAVENOUS | Status: AC
  Administered 2020-09-13: 50 meq
  Filled 2020-09-13: qty 100

## 2020-09-14 LAB — CALCIUM, IONIZED: Calcium, Ionized, Serum: 3.7 mg/dL — ABNORMAL LOW (ref 4.5–5.6)

## 2020-09-16 MED FILL — Medication: Qty: 2 | Status: AC

## 2020-09-17 ENCOUNTER — Other Ambulatory Visit: Payer: Self-pay | Admitting: Legal Medicine

## 2020-09-17 LAB — PATHOLOGIST SMEAR REVIEW

## 2020-10-11 NOTE — Progress Notes (Signed)
Patient SBP is falling again with signs of central cyanosis and mottled coloration. MAP is in 40's. Patient is profoundly hypotensive despite aggressive vasoactive infusions. See MAR. RN is aware and at bedside. RRT at bedside

## 2020-10-11 NOTE — Significant Event (Signed)
This RN called patient's spouse with the number listed on Epic. No pickup. RN called patient's family friend Jonelle Sidle, who did picked up call. Per friend, patient's spouse does not have a functional phone at the house. RN explained that patient's condition is critical and staff needs to reach patient's spouse. Per the friend, she will call a neighbor who will bring patient's spouse to hospital.     Kathaleen Grinder, RN

## 2020-10-11 NOTE — Significant Event (Signed)
Patient's spouse and friend Ruby Cola requests that patient's belongings go with patient to the morgue. At this time, they do not have a funeral home arrangements. Telephone number to bed placement has been given to them to call when they do have the funeral home information.  Support given to patient's spouse and friend during the time they came to see the patient.    Kathaleen Grinder, RN

## 2020-10-11 NOTE — Progress Notes (Signed)
SLP Cancellation Note  Patient Details Name: Andrea Shepherd MRN: [id number] DOB: [birth year]   Cancelled treatment:   Pt transferred to ICU and intubated.  Our service will sign off.  Jacquelina Hewins L. Tivis Ringer, [place] Office number [phone] Pager [phone]         Juan Quam Laurice Oct 13, 2020, 8:55 AM

## 2020-10-11 NOTE — Death Summary Note (Signed)
DEATH SUMMARY   Patient Details  Name: Andrea Shepherd MRN: [id number] DOB: Dec 14, 1934  Admission/Discharge Information   Admit Date:  September 29, 2020  Date of Death: Date of Death: 2020-10-08  Time of Death: Time of Death: 0925  Length of Stay: 8  Referring Physician: Rochel Brome, MD   Reason(s) for Hospitalization  Fall  Hip fracture  Diagnoses  Preliminary cause of death:  Secondary Diagnoses (including complications and co-morbidities):  Principal Problem:   Closed left hip fracture ([place]) Active Problems:   CKD (chronic kidney disease), stage IV ([place])   Peripheral arterial occlusive disease ([place])   Hypertensive urgency   Hypokalemia   Normocytic anemia   Closed fracture of 5th metacarpal   Pleural effusion, bilateral   Status post surgery   Protein-calorie malnutrition, severe   Acute respiratory failure with hypoxia ([place])   Encounter for central line placement   Shock ([place])   Encephalopathy acute   Metabolic acidosis   Brief Hospital Course (including significant findings, care, treatment, and services provided and events leading to death)  Andrea Shepherd is a 86 y.o. year old female who was admitted on 09/05/20 after a fall at home. She was diagnosed with left hip fracture and left wrist fracture. She underwent L hip ORIF, but developed severe acute LLE ischemia requiring angiography of her aorta and LLE, redo femoral endarterectomy and profundoplasty. She has severe underlying PAD with ongoing leg ischemia. She has underlying CKD and developed AKI requiring initiation of HD this admission. Unfortunately following dialysis she had acute encephalopathy and developed progressive shock, respiratory failure. She was transferred to the ICU, requiring intubation and vasopressors to be started. Despite aggressive measures she continued to deteriorate overnight and suffered cardiac arrests x 2 on 09-Oct-2022. She passed away at 9:25AM on 10/08/20.      Pertinent Labs and Studies  Significant  Diagnostic Studies DG Abd 1 View  Result Date: 09/12/2020 CLINICAL DATA:  Check gastric catheter placement EXAM: ABDOMEN - 1 VIEW COMPARISON:  None. FINDINGS: Gastric catheter is noted deep within the stomach. Scattered large and small bowel gas is noted. Vascular calcifications with prior stenting are noted. Right-sided pleural effusion is seen. Degenerative changes of the lumbar spine are seen. IMPRESSION: Gastric catheter within the stomach. Electronically Signed   By: Inez Catalina M.D.   On: 09/12/2020 18:50   CT Head Wo Contrast  Result Date: 09-29-20 CLINICAL DATA:  Fall, tripped on stool. Head trauma, minor (Age >= 65y) EXAM: CT HEAD WITHOUT CONTRAST TECHNIQUE: Contiguous axial images were obtained from the base of the skull through the vertex without intravenous contrast. COMPARISON:  Head CT 01/21/2016 FINDINGS: Brain: No intracranial hemorrhage, mass effect, or midline shift. Normal brain volume for age. Moderate periventricular and deep chronic small vessel ischemia. No hydrocephalus. The basilar cisterns are patent. No evidence of territorial infarct or acute ischemia. No extra-axial or intracranial fluid collection. Vascular: Atherosclerosis of skullbase vasculature without hyperdense vessel or abnormal calcification. Skull: No fracture or focal lesion. Sinuses/Orbits: Resolved right mastoid effusion. Paranasal sinuses and mastoid air cells are clear. Small mucous retention cyst left maxillary sinus. Bilateral lens extraction. Other: None. IMPRESSION: 1. No acute intracranial abnormality. No skull fracture. 2. Moderate chronic small vessel ischemia. Electronically Signed   By: Keith Rake M.D.   On: 09/29/2020 23:07   CT CERVICAL SPINE WO CONTRAST  Result Date: 2020-09-29 CLINICAL DATA:  Neck pain, chronic, no prior imaging Fall, trip on stool. EXAM: CT CERVICAL SPINE WITHOUT CONTRAST TECHNIQUE: Multidetector CT imaging of  the cervical spine was performed without intravenous contrast.  Multiplanar CT image reconstructions were also generated. COMPARISON:  None. FINDINGS: Alignment: No traumatic subluxation. Trace retrolisthesis of C3 on C4. Skull base and vertebrae: No acute fracture. Vertebral body heights are maintained. The dens and skull base are intact. Bone island within C7 vertebral body. Soft tissues and spinal canal: No prevertebral fluid or swelling. No visible canal hematoma. Disc levels: Diffuse degenerative disc disease, disc space narrowing is most prominent at C3-C4, C5-C6 and C6-C7. posterior disc osteophyte complex at C5-C6 and C4-C5 on the right causes narrowing of the vertebral foramen and mild mass effect on the canal. Upper chest: Layering pleural effusions, at least moderate in size. No apical pneumothorax. Other: Carotid calcifications. IMPRESSION: 1. Multilevel degenerative disc disease throughout the cervical spine without acute fracture or subluxation. 2. Posterior disc osteophyte complex at C5-C6 and C4-C5 on the right causes narrowing of the vertebral foramen and mild mass effect on the canal. 3. Layering pleural effusions at least moderate in size. Electronically Signed   By: Keith Rake M.D.   On: 08/13/2020 23:11   US RENAL  Result Date: 09/10/2020 CLINICAL DATA:  Chronic renal insufficiency EXAM: RENAL / URINARY TRACT ULTRASOUND COMPLETE COMPARISON:  01/09/2011 FINDINGS: Right Kidney: Renal measurements: 10.9 x 4.8 x 4.7 cm = volume: 128.4 mL. Diffuse increased renal cortical echotexture consistent with medical renal disease. Multiple right renal cysts are identified. The majority of the cysts are simple in appearance, largest in the lower pole measuring 2.6 cm. A minimally complex cyst within the upper pole right kidney measures 2.6 x 2.7 x 4.0 cm, with lobularity and a single thin septation. Previously this had measured 2.4 x 2.2 by 3.1 cm. No hydronephrosis or solid renal mass. Left Kidney: Renal measurements: 8.1 x 4.2 x 4.1 cm = volume: 71.6 mL.  Diffuse increased renal echotexture consistent with medical renal disease. Marked cortical atrophy. No hydronephrosis or renal mass. Bladder: Appears normal for degree of bladder distention. Other: Incidental bilateral pleural effusions are seen, left greater than right. IMPRESSION: 1. Bilateral increased renal cortical echotexture consistent with medical renal disease. 2. Multiple right renal cysts. 3. Large bilateral pleural effusions. Electronically Signed   By: Randa Ngo M.D.   On: 09/10/2020 20:02   IR Fluoro Guide CV Line Right  Result Date: 09/11/2020 INDICATION: End-stage renal disease. In need of durable intravenous access for initiation of hemodialysis. EXAM: TUNNELED CENTRAL VENOUS HEMODIALYSIS CATHETER PLACEMENT WITH ULTRASOUND AND FLUOROSCOPIC GUIDANCE MEDICATIONS: The patient is currently admitted to the hospital and receiving intravenous antibiotics. The antibiotic was given in an appropriate time interval prior to skin puncture. ANESTHESIA/SEDATION: None FLUOROSCOPY TIME:  1 minute (4 mGy) COMPLICATIONS: None immediate. PROCEDURE: Informed written consent was obtained from the patient after a discussion of the risks, benefits, and alternatives to treatment. Questions regarding the procedure were encouraged and answered. The right neck and chest were prepped with chlorhexidine in a sterile fashion, and a sterile drape was applied covering the operative field. Maximum barrier sterile technique with sterile gowns and gloves were used for the procedure. A timeout was performed prior to the initiation of the procedure. After creating a small venotomy incision, a micropuncture kit was utilized to access the internal jugular vein. Real-time ultrasound guidance was utilized for vascular access including the acquisition of a permanent ultrasound image documenting patency of the accessed vessel. The microwire was utilized to measure appropriate catheter length. A stiff Glidewire was advanced to the  level of the IVC  and the micropuncture sheath was exchanged for a peel-away sheath. A palindrome tunneled hemodialysis catheter measuring 19 cm from tip to cuff was tunneled in a retrograde fashion from the anterior chest wall to the venotomy incision. The catheter was then placed through the peel-away sheath with tips ultimately positioned within the distal aspect of the SVC. Attempted aspiration from the arterial lumen resulting in sluggish flow despite fluoroscopic reposition of the dialysis catheter. As such, the existing 19 cm tip to cuff dialysis catheter was cannulated with a stiff Glidewire which was advanced to the level of the IVC. Under fluoroscopic guidance, a new dialysis catheter was exchanged for a slightly larger now 23 cm tip to cuff dialysis catheter with tip now more ideally positioned within the superior aspect the right atrium. The catheter aspirates and flushes normally. The catheter was flushed with appropriate volume heparin dwells. The catheter exit site was secured with a 0-Prolene retention suture. The venotomy incision was closed with Dermabond and Steri-strips. Dressings were applied. The patient tolerated the procedure well without immediate post procedural complication. IMPRESSION: Successful placement of 23 cm tip to cuff tunneled hemodialysis catheter via the right internal jugular vein with tips terminating within the superior aspect of the right atrium. The catheter is ready for immediate use. Electronically Signed   By: Sandi Mariscal M.D.   On: 09/11/2020 14:59   IR US Guide Vasc Access Right  Result Date: 09/11/2020 INDICATION: End-stage renal disease. In need of durable intravenous access for initiation of hemodialysis. EXAM: TUNNELED CENTRAL VENOUS HEMODIALYSIS CATHETER PLACEMENT WITH ULTRASOUND AND FLUOROSCOPIC GUIDANCE MEDICATIONS: The patient is currently admitted to the hospital and receiving intravenous antibiotics. The antibiotic was given in an appropriate time interval  prior to skin puncture. ANESTHESIA/SEDATION: None FLUOROSCOPY TIME:  1 minute (4 mGy) COMPLICATIONS: None immediate. PROCEDURE: Informed written consent was obtained from the patient after a discussion of the risks, benefits, and alternatives to treatment. Questions regarding the procedure were encouraged and answered. The right neck and chest were prepped with chlorhexidine in a sterile fashion, and a sterile drape was applied covering the operative field. Maximum barrier sterile technique with sterile gowns and gloves were used for the procedure. A timeout was performed prior to the initiation of the procedure. After creating a small venotomy incision, a micropuncture kit was utilized to access the internal jugular vein. Real-time ultrasound guidance was utilized for vascular access including the acquisition of a permanent ultrasound image documenting patency of the accessed vessel. The microwire was utilized to measure appropriate catheter length. A stiff Glidewire was advanced to the level of the IVC and the micropuncture sheath was exchanged for a peel-away sheath. A palindrome tunneled hemodialysis catheter measuring 19 cm from tip to cuff was tunneled in a retrograde fashion from the anterior chest wall to the venotomy incision. The catheter was then placed through the peel-away sheath with tips ultimately positioned within the distal aspect of the SVC. Attempted aspiration from the arterial lumen resulting in sluggish flow despite fluoroscopic reposition of the dialysis catheter. As such, the existing 19 cm tip to cuff dialysis catheter was cannulated with a stiff Glidewire which was advanced to the level of the IVC. Under fluoroscopic guidance, a new dialysis catheter was exchanged for a slightly larger now 23 cm tip to cuff dialysis catheter with tip now more ideally positioned within the superior aspect the right atrium. The catheter aspirates and flushes normally. The catheter was flushed with  appropriate volume heparin dwells. The catheter exit site was  secured with a 0-Prolene retention suture. The venotomy incision was closed with Dermabond and Steri-strips. Dressings were applied. The patient tolerated the procedure well without immediate post procedural complication. IMPRESSION: Successful placement of 23 cm tip to cuff tunneled hemodialysis catheter via the right internal jugular vein with tips terminating within the superior aspect of the right atrium. The catheter is ready for immediate use. Electronically Signed   By: Sandi Mariscal M.D.   On: 09/11/2020 14:59   DG CHEST PORT 1 VIEW  Result Date: 09-25-20 CLINICAL DATA:  Hypoxia EXAM: PORTABLE CHEST 1 VIEW COMPARISON:  09/12/2020 at 7:41 p.m. FINDINGS: Two frontal views of the chest demonstrate stable endotracheal tube, enteric catheter, and bilateral internal jugular catheters. Continued bilateral pleural effusions and bibasilar consolidation, right greater than left. No evidence of pneumothorax on this supine evaluation. No acute bony abnormalities. IMPRESSION: 1. Stable bilateral pleural effusions and bibasilar consolidation. 2. Stable support devices. Electronically Signed   By: Randa Ngo M.D.   On: 09/25/2020 01:22   DG CHEST PORT 1 VIEW  Result Date: 09/12/2020 CLINICAL DATA:  Encounter for central line placement. EXAM: PORTABLE CHEST 1 VIEW COMPARISON:  Earlier today. FINDINGS: New left internal jugular central venous catheter tip projects over the upper SVC. No pneumothorax. Right-sided dialysis catheter, endotracheal tube, and enteric tube remain in place. Stable heart size and mediastinal contours with unchanged cardiomegaly. Aortic atherosclerosis. Hazy lung base opacities consistent with pleural effusions and atelectasis/airspace disease, not significantly changed from earlier today. Right axillary surgical clips. IMPRESSION: 1. New left internal jugular central venous catheter with tip projecting over the upper SVC. No  pneumothorax. 2. Remaining support apparatus is unchanged. 3. Unchanged bilateral pleural effusions and atelectasis/airspace disease. Electronically Signed   By: Keith Rake M.D.   On: 09/12/2020 20:02   Portable Chest x-ray  Result Date: 09/12/2020 CLINICAL DATA:  NG tube, ET tube placement EXAM: PORTABLE CHEST 1 VIEW COMPARISON:  09/12/2020 FINDINGS: Endotracheal tube is 5.7 cm above the carina. NG tube enters the stomach. Right dialysis catheter is unchanged. Hyperinflation of the lungs. Layering bilateral pleural effusions with bilateral lower lobe airspace disease. Mild cardiomegaly, aortic atherosclerosis. IMPRESSION: Endotracheal tube 5.7 cm above the carina.  NG tube in the stomach. Layering effusions and bilateral lower lobe airspace opacities. Cardiomegaly, hyperinflation. Electronically Signed   By: Rolm Baptise M.D.   On: 09/12/2020 18:45   DG Chest Port 1 View  Result Date: 09/12/2020 CLINICAL DATA:  Shortness of breath. EXAM: PORTABLE CHEST 1 VIEW COMPARISON:  09/11/2020. FINDINGS: Dual-lumen catheter noted stable position. Heart size normal. Prominent bilateral layering pleural effusions again noted. Persistent left lower lobe atelectasis/consolidation. Tiny calcified pulmonary nodules most consistent with prior granulomas disease. No pneumothorax. Surgical clips right chest and over left neck. Prominent skin folds noted bilaterally. Degenerative changes and scoliosis thoracolumbar spine. No acute bony abnormality. IMPRESSION: 1.  Dual-lumen catheter stable position. 2. Prominent bilateral layering pleural effusions again noted. Persistent left lower lobe atelectasis/consolidation. Electronically Signed   By: Marcello Moores  Register   On: 09/12/2020 09:13   DG Chest Port 1 View  Result Date: 09/11/2020 CLINICAL DATA:  Acute respiratory distress EXAM: PORTABLE CHEST 1 VIEW COMPARISON:  09/09/2020 FINDINGS: Cardiac shadow is within normal limits. Aortic calcifications are noted. There is been  interval placement of a right jugular dialysis catheter in satisfactory position. Bilateral pleural effusions are noted. Left retrocardiac consolidation is again noted and stable. No pneumothorax is seen. IMPRESSION: No pneumothorax following dialysis catheter placement. Bilateral effusions and left  retrocardiac consolidation stable from the prior study. Electronically Signed   By: Inez Catalina M.D.   On: 09/11/2020 19:37   DG CHEST PORT 1 VIEW  Result Date: 09/09/2020 CLINICAL DATA:  Short of breath, wheezing EXAM: PORTABLE CHEST 1 VIEW COMPARISON:  09/08/2020 FINDINGS: Single frontal view of the chest demonstrates a stable cardiac silhouette. Persistent bibasilar consolidation and effusions, without change since prior study. No pneumothorax. No acute bony abnormalities. IMPRESSION: 1. Stable bibasilar consolidation and bilateral effusions. Electronically Signed   By: Randa Ngo M.D.   On: 09/09/2020 02:49   DG CHEST PORT 1 VIEW  Result Date: 09/08/2020 CLINICAL DATA:  Follow-up pneumothorax. EXAM: PORTABLE CHEST 1 VIEW COMPARISON:  09/08/2020. FINDINGS: The tiny right apical pneumothorax noted on the earlier exam is not visualized on the current study. Persistent opacity at both lung bases consistent with a combination of pleural fluid and atelectasis and/or infiltrate. No new lung abnormalities. Cardiac silhouette is mildly enlarged. No mediastinal or hilar masses. IMPRESSION: 1. No current evidence of a pneumothorax. 2. Persistent lung base opacities consistent with pleural effusions with associated atelectasis and/or infiltrate. Electronically Signed   By: Lajean Manes M.D.   On: 09/08/2020 19:14   DG Chest Port 1 View  Result Date: 09/08/2020 CLINICAL DATA:  86 year old female status post right-sided thoracentesis. EXAM: PORTABLE CHEST 1 VIEW COMPARISON:  Chest radiograph dated 08/20/2020. FINDINGS: Small bilateral pleural effusions with associated bibasilar atelectasis. Slight improvement in  aeration of the right lung base compared to the prior radiograph. Faint linear density in the right apex may be artifactual or represent a tiny pneumothorax measuring approximately 5 mm in thickness. Follow-up recommended. Stable cardiomegaly. Atherosclerotic calcification of the aorta. No acute osseous pathology. Degenerative changes of spine. IMPRESSION: 1. Small bilateral pleural effusions with associated bibasilar atelectasis. Slight improvement in aeration of the right lung base compared to the prior radiograph. 2. Possible tiny right apical pneumothorax. Follow-up recommended. Electronically Signed   By: Anner Crete M.D.   On: 09/08/2020 15:20   DG Chest Port 1 View  Result Date: 09/01/2020 CLINICAL DATA:  Fall, hip fracture, hand fracture, chest pain EXAM: PORTABLE CHEST 1 VIEW COMPARISON:  06/21/2020 FINDINGS: The lungs are mildly hyperinflated. Small bilateral pleural effusions are present resulting in gradual basilar opacification on the right. No pneumothorax. Mild cardiomegaly is stable. Pulmonary vascularity is normal. Surgical clips are seen within the right axilla. No acute bone abnormality. IMPRESSION: Small bilateral pleural effusions. Stable cardiomegaly. COPD. Electronically Signed   By: Fidela Salisbury MD   On: 08/21/2020 22:57   DG Hand Complete Left  Result Date: 08/26/2020 CLINICAL DATA:  Fall, left hand pain EXAM: LEFT HAND - COMPLETE 3+ VIEW COMPARISON:  None. FINDINGS: Three view radiograph left hand demonstrates an acute, transverse, extra-articular fracture of the mid to distal diaphysis of the left fifth metacarpal with moderate volar angulation of the distal fracture fragment. No other fracture or dislocation identified. Joint spaces are preserved. Vascular calcifications are seen within the soft tissues. Soft tissue swelling is seen lateral to the fracture plane. IMPRESSION: Acute transverse angulated extra-articular fracture of the distal left fifth metacarpal diaphysis.  Electronically Signed   By: Fidela Salisbury MD   On: 08/25/2020 22:55   DG C-Arm 1-60 Min  Result Date: 09/05/2020 CLINICAL DATA:  Surgery, elective. Additional history provided: Left intramedullary (IM) nail inter trochanteric. Provided fluoroscopy time 1 minutes, 54 seconds (9.63 mGy). EXAM: LEFT FEMUR 2 VIEWS; DG C-ARM 1-60 MIN COMPARISON:  Radiographs of the  right hip 08/30/2020. FINDINGS: Six intraoperative fluoroscopic images of the left hip/femur are submitted. On the provided images, there are findings of interval intramedullary nail placement within the left femur. There are 2 proximal interlocking screws which traverse the femoral head/neck. A single distal interlocking screw is also present. The hardware traverses a known fracture of the proximal left femur. Persistent, although significantly improved, fracture displacement. IMPRESSION: Six intraoperative fluoroscopic images of the left hip/femur from left femoral intramedullary nail placement, as described. Electronically Signed   By: Kellie Simmering DO   On: 08/14/2020 12:34   ECHOCARDIOGRAM COMPLETE  Result Date: 08/23/2020    ECHOCARDIOGRAM REPORT   Patient Name:   JESSILYN CATINO Date of Exam: 08/19/2020 Medical Rec #:  [id number]   Height:       68.0 in Accession #:    [id number]  Weight:       101.0 lb Date of Birth:  08-28-1934    BSA:          1.529 m Patient Age:    4 years    BP:           182/43 mmHg Patient Gender: F           HR:           77 bpm. Exam Location:  Inpatient Procedure: 2D Echo, Cardiac Doppler and Color Doppler Indications:    Cardiomegaly  History:        Patient has no prior history of Echocardiogram examinations. PAD                 and COPD; Risk Factors:Hypertension, Dyslipidemia and Current                 Smoker. Left hip fracture, pleural effusion.  Sonographer:    Dustin Flock Referring Phys: [phone] [place]  1. Left ventricular ejection fraction, by estimation, is 60 to 65%. The left ventricle has  normal function. The left ventricle has no regional wall motion abnormalities. There is mild left ventricular hypertrophy. Left ventricular diastolic parameters are consistent with Grade I diastolic dysfunction (impaired relaxation). Elevated left atrial pressure.  2. Right ventricular systolic function is normal. The right ventricular size is normal. There is moderately elevated pulmonary artery systolic pressure.  3. Left atrial size was mildly dilated.  4. A small pericardial effusion is present. Moderate pleural effusion in the left lateral region.  5. The mitral valve is normal in structure. Mild mitral valve regurgitation. No evidence of mitral stenosis.  6. The aortic valve is normal in structure. Aortic valve regurgitation is mild. No aortic stenosis is present.  7. The inferior vena cava is normal in size with greater than 50% respiratory variability, suggesting right atrial pressure of 3 mmHg. FINDINGS  Left Ventricle: Left ventricular ejection fraction, by estimation, is 60 to 65%. The left ventricle has normal function. The left ventricle has no regional wall motion abnormalities. The left ventricular internal cavity size was normal in size. There is  mild left ventricular hypertrophy. Left ventricular diastolic parameters are consistent with Grade I diastolic dysfunction (impaired relaxation). Elevated left atrial pressure. Right Ventricle: The right ventricular size is normal.Right ventricular systolic function is normal. There is moderately elevated pulmonary artery systolic pressure. The tricuspid regurgitant velocity is 3.65 m/s, and with an assumed right atrial pressure of 3 mmHg, the estimated right ventricular systolic pressure is 18.5 mmHg. Left Atrium: Left atrial size was mildly dilated. Right Atrium: Right atrial size was normal in  size. Pericardium: A small pericardial effusion is present. Mitral Valve: The mitral valve is normal in structure. Mild mitral valve regurgitation. No evidence of  mitral valve stenosis. Tricuspid Valve: The tricuspid valve is normal in structure. Tricuspid valve regurgitation is mild . No evidence of tricuspid stenosis. Aortic Valve: The aortic valve is normal in structure. Aortic valve regurgitation is mild. Aortic regurgitation PHT measures 379 msec. No aortic stenosis is present. Pulmonic Valve: The pulmonic valve was normal in structure. Pulmonic valve regurgitation is not visualized. No evidence of pulmonic stenosis. Aorta: The aortic root is normal in size and structure. Venous: The inferior vena cava is normal in size with greater than 50% respiratory variability, suggesting right atrial pressure of 3 mmHg. IAS/Shunts: No atrial level shunt detected by color flow Doppler. Additional Comments: There is a moderate pleural effusion in the left lateral region.  LEFT VENTRICLE PLAX 2D LVIDd:         3.80 cm  Diastology LVIDs:         2.80 cm  LV e' medial:    5.98 cm/s LV PW:         1.20 cm  LV E/e' medial:  16.1 LV IVS:        1.30 cm  LV e' lateral:   9.25 cm/s LVOT diam:     2.10 cm  LV E/e' lateral: 10.4 LV SV:         81 LV SV Index:   53 LVOT Area:     3.46 cm  RIGHT VENTRICLE RV Basal diam:  2.20 cm RV S prime:     16.40 cm/s TAPSE (M-mode): 2.8 cm LEFT ATRIUM             Index       RIGHT ATRIUM           Index LA diam:        2.90 cm 1.90 cm/m  RA Area:     11.90 cm LA Vol (A2C):   28.0 ml 18.32 ml/m RA Volume:   27.60 ml  18.05 ml/m LA Vol (A4C):   62.0 ml 40.56 ml/m LA Biplane Vol: 43.8 ml 28.65 ml/m  AORTIC VALVE LVOT Vmax:   107.00 cm/s LVOT Vmean:  74.200 cm/s LVOT VTI:    0.235 m AI PHT:      379 msec  AORTA Ao Root diam: 3.10 cm MITRAL VALVE                TRICUSPID VALVE MV Area (PHT): 3.85 cm     TR Peak grad:   53.3 mmHg MV Decel Time: 197 msec     TR Vmax:        365.00 cm/s MV E velocity: 96.30 cm/s MV A velocity: 135.00 cm/s  SHUNTS MV E/A ratio:  0.71         Systemic VTI:  0.24 m                             Systemic Diam: 2.10 cm Kirk Ruths MD Electronically signed by Kirk Ruths MD Signature Date/Time: 08/12/2020/3:24:08 PM    Final    DG Hip Unilat W or Wo Pelvis 2-3 Views Left  Result Date: 08/18/2020 CLINICAL DATA:  Fall, left hip deformity EXAM: DG HIP (WITH OR WITHOUT PELVIS) 2-3V LEFT COMPARISON:  06/05/2017 FINDINGS: There is an acute, intratrochanteric fracture of the left hip with marked medial and posterior angulation, avulsion of  the lesser trochanter, and mild impaction. The femoral head is still seated within the left acetabulum. Mild superimposed left hip degenerative arthritis. The pelvis and right hip are intact. Moderate right hip degenerative arthritis. Advanced vascular calcifications are seen within the pelvis and medial thighs bilaterally. Bilateral lower extremity arterial inflow stenting has been performed. Involuted fibroid noted within the pelvis. IMPRESSION: Acute, impacted, markedly angulated intratrochanteric fracture of the left hip with avulsion of the lesser trochanter. Femoral head is still seated within the left acetabulum. Mild left hip degenerative arthritis. Electronically Signed   By: Fidela Salisbury MD   On: 08/20/2020 22:54   DG FEMUR MIN 2 VIEWS LEFT  Result Date: 09/09/2020 CLINICAL DATA:  Surgery, elective. Additional history provided: Left intramedullary (IM) nail inter trochanteric. Provided fluoroscopy time 1 minutes, 54 seconds (9.63 mGy). EXAM: LEFT FEMUR 2 VIEWS; DG C-ARM 1-60 MIN COMPARISON:  Radiographs of the right hip 08/22/2020. FINDINGS: Six intraoperative fluoroscopic images of the left hip/femur are submitted. On the provided images, there are findings of interval intramedullary nail placement within the left femur. There are 2 proximal interlocking screws which traverse the femoral head/neck. A single distal interlocking screw is also present. The hardware traverses a known fracture of the proximal left femur. Persistent, although significantly improved, fracture displacement.  IMPRESSION: Six intraoperative fluoroscopic images of the left hip/femur from left femoral intramedullary nail placement, as described. Electronically Signed   By: Kellie Simmering DO   On: 08/11/2020 12:34   VAS [place] LOWER EXTREMITY ARTERIAL DUPLEX  Result Date: 09/08/2020 LOWER EXTREMITY ARTERIAL DUPLEX STUDY Patient Name:  LUVERNA DEGENHART  Date of Exam:   08/16/2020 Medical Rec #: [id number]    Accession #:    [id number] Date of Birth: Sep 21, 1934     Patient Gender: F Patient Age:   22Y Exam Location:  [facility] Procedure:      VAS [place] LOWER EXTREMITY ARTERIAL DUPLEX Referring Phys: [id number] [place] --------------------------------------------------------------------------------  Indications: Ischemic, Cold, mottled foot. History of PAD with multiple              interventions at outside facility. High Risk Factors: Hypertension, current smoker. Other Factors: Hip fracture5/25/22 with repair 08/17/2020.  Current ABI: n/a Comparison Study: No prior study Performing Technologist: Sharion Dove RVS  Examination Guidelines: A complete evaluation includes B-mode imaging, spectral Doppler, color Doppler, and power Doppler as needed of all accessible portions of each vessel. Bilateral testing is considered an integral part of a complete examination. Limited examinations for reoccurring indications may be performed as noted.  +----------+--------+-----+---------------+------------------+-----------------+ LEFT      PSV cm/sRatioStenosis       Waveform          Comments          +----------+--------+-----+---------------+------------------+-----------------+ CFA Prox  29                          monophasic                          +----------+--------+-----+---------------+------------------+-----------------+ SFA Prox  140          30-49% stenosismonophasic        stenosis based on  plaque formation   +----------+--------+-----+---------------+------------------+-----------------+ SFA Mid   11                          monophasic        areas of                                                                  occlusion noted                                                           in the mid SFA    +----------+--------+-----+---------------+------------------+-----------------+ SFA Distal15                          monophasic        areas of                                                                  occlusion noted                                                           in the distal SFA +----------+--------+-----+---------------+------------------+-----------------+ POP Prox  15                          dampened                                                                  monophasic                          +----------+--------+-----+---------------+------------------+-----------------+ POP Mid   36                          dampened                                                                  monophasic                          +----------+--------+-----+---------------+------------------+-----------------+ POP Distal13  dampened                                                                  monophasic                          +----------+--------+-----+---------------+------------------+-----------------+ ATA Prox               occluded                                           +----------+--------+-----+---------------+------------------+-----------------+ ATA Mid                occluded                                           +----------+--------+-----+---------------+------------------+-----------------+ ATA Distal             occluded                                           +----------+--------+-----+---------------+------------------+-----------------+ PTA  Prox               occluded                                           +----------+--------+-----+---------------+------------------+-----------------+ PTA Mid                occluded                                           +----------+--------+-----+---------------+------------------+-----------------+ PTA Distal             occluded                                           +----------+--------+-----+---------------+------------------+-----------------+  Summary: Left: 30-49% stenosis noted in the mid superficial femoral artery. Areas of occlusion noted in the mid and distal SFA. The ATA and PTA appear occluded. Significant, diffuse, calcific plaque noted throughout the left lower extremity.  See table(s) above for measurements and observations. Electronically signed by Monica Martinez MD on 09/08/2020 at 10:16:53 AM.    Final    HYBRID OR IMAGING ([place])  Result Date: 08/30/2020 There is no interpretation for this exam.  This order is for images obtained during a surgical procedure.  Please See "Surgeries" Tab for more information regarding the procedure.   US THORACENTESIS ASP PLEURAL SPACE W/IMG GUIDE  Result Date: 09/08/2020 INDICATION: Patient history of breast cancer, tobacco use, chronic kidney disease. Found to have pleural effusion. Request is for therapeutic and diagnostic thoracentesis EXAM: ULTRASOUND GUIDED THERAPEUTIC AND DIAGNOSTIC RIGHT-SIDED THORACENTESIS MEDICATIONS: LIDOCAINE  1% 10 ML COMPLICATIONS: SIR Level A - No therapy, no consequence.  Tiny pneumothorax. PROCEDURE: An ultrasound guided thoracentesis was thoroughly discussed with the patient and questions answered. The benefits, risks, alternatives and complications were also discussed. The patient understands and wishes to proceed with the procedure. Written consent was obtained. Ultrasound was performed to localize and mark an adequate pocket of fluid in the right chest. The area was then prepped and draped  in the normal sterile fashion. 1% Lidocaine was used for local anesthesia. Under ultrasound guidance a 6 Fr Safe-T-Centesis catheter was introduced. Thoracentesis was performed. The catheter was removed and a dressing applied. FINDINGS: A total of approximately 650 mL of straw-colored fluid was removed. Samples were sent to the laboratory as requested by the clinical team. IMPRESSION: Successful ultrasound guided therapeutic and diagnostic right sided thoracentesis yielding 650 mL of pleural fluid. Read by: Rushie Nyhan, NP Electronically Signed   By: Ruthann Cancer MD   On: 09/08/2020 15:46    Microbiology Recent Results (from the past 240 hour(s))  Resp Panel by RT-PCR (Flu A&B, Covid) Nasopharyngeal Swab     Status: None   Collection Time: 08/17/2020 10:15 PM   Specimen: Nasopharyngeal Swab; Nasopharyngeal(NP) swabs in vial transport medium  Result Value Ref Range Status   SARS Coronavirus 2 by RT PCR NEGATIVE NEGATIVE Final    Comment: (NOTE) SARS-CoV-2 target nucleic acids are NOT DETECTED.  The SARS-CoV-2 RNA is generally detectable in upper respiratory specimens during the acute phase of infection. The lowest concentration of SARS-CoV-2 viral copies this assay can detect is 138 copies/mL. A negative result does not preclude SARS-Cov-2 infection and should not be used as the sole basis for treatment or other patient management decisions. A negative result may occur with  improper specimen collection/handling, submission of specimen other than nasopharyngeal swab, presence of viral mutation(s) within the areas targeted by this assay, and inadequate number of viral copies(<138 copies/mL). A negative result must be combined with clinical observations, patient history, and epidemiological information. The expected result is Negative.  Fact Sheet for Patients:  EntrepreneurPulse.com.au  Fact Sheet for Healthcare Providers:   IncredibleEmployment.be  This test is no t yet approved or cleared by the [place] FDA and  has been authorized for detection and/or diagnosis of SARS-CoV-2 by FDA under an Emergency Use Authorization (EUA). This EUA will remain  in effect (meaning this test can be used) for the duration of the COVID-19 declaration under Section 564(b)(1) of the Act, 21 U.S.C.section 360bbb-3(b)(1), unless the authorization is terminated  or revoked sooner.       Influenza A by PCR NEGATIVE NEGATIVE Final   Influenza B by PCR NEGATIVE NEGATIVE Final    Comment: (NOTE) The Xpert Xpress SARS-CoV-2/FLU/RSV plus assay is intended as an aid in the diagnosis of influenza from Nasopharyngeal swab specimens and should not be used as a sole basis for treatment. Nasal washings and aspirates are unacceptable for Xpert Xpress SARS-CoV-2/FLU/RSV testing.  Fact Sheet for Patients: EntrepreneurPulse.com.au  Fact Sheet for Healthcare Providers: IncredibleEmployment.be  This test is not yet approved or cleared by the [place] FDA and has been authorized for detection and/or diagnosis of SARS-CoV-2 by FDA under an Emergency Use Authorization (EUA). This EUA will remain in effect (meaning this test can be used) for the duration of the COVID-19 declaration under Section 564(b)(1) of the Act, 21 U.S.C. section 360bbb-3(b)(1), unless the authorization is terminated or revoked.  Performed at [facility], [place] [address],  [place], [place] [address]   Surgical pcr screen     Status: None   Collection Time: 09/05/20  6:59 PM   Specimen: Nasal Mucosa; Nasal Swab  Result Value Ref Range Status   MRSA, PCR NEGATIVE NEGATIVE Final   Staphylococcus aureus NEGATIVE NEGATIVE Final    Comment: (NOTE) The Xpert SA Assay (FDA approved for NASAL specimens in patients 54 years of age and older), is one component of a comprehensive surveillance program.  It is not intended to diagnose infection nor to guide or monitor treatment. Performed at [facility], [place] [address] [place], [place] [address]   Gram stain     Status: None   Collection Time: 09/08/20  3:45 PM   Specimen: Pleura  Result Value Ref Range Status   Specimen Description PLEURAL FLUID  Final   Special Requests NONE  Final   Gram Stain   Final    NO WBC SEEN NO ORGANISMS SEEN CYTOSPIN SMEAR Performed at [facility], [address] [place], [place] [address]    Report Status 09/09/2020 FINAL  Final  Culture, body fluid w Gram Stain-bottle     Status: None   Collection Time: 09/08/20  3:45 PM   Specimen: Pleura  Result Value Ref Range Status   Specimen Description PLEURAL FLUID  Final   Special Requests NONE  Final   Culture   Final    NO GROWTH 5 DAYS Performed at [place] [place] [address], [place], [place] [address]    Report Status 09/23/2020 FINAL  Final    Lab Basic Metabolic Panel: Recent Labs  Lab 09/11/20 0242 09/11/20 2045 09/12/20 0231 09/12/20 2111 09/12/20 2300 09/12/20 2316 09-23-20 0115 09/23/2020 0353 23-Sep-2020 0508 23-Sep-2020 0759 09/23/20 0829  NA 136 136 136   < > 138 136 139 142  --  142 140  K 5.2* 4.7 5.0   < > 4.5 4.5 4.0 6.6*  --  4.6 6.4*  CL 101 98 99  --  106  --   --  106  --   --  102  CO2 20* 21* 27  --  24  --   --  22  --   --  20*  GLUCOSE 131* 105* 132*  --  89  --   --  38*  --   --  326*  BUN 73* 74* 77*  --  44*  --   --  44*  --   --  41*  CREATININE 3.74* 3.76* 3.85*  --  2.79*  --   --  2.85*  --   --  2.80*  CALCIUM 8.2* 8.2* 8.4*  --  7.4*  --   --  7.3*  --   --  6.1*  MG  --   --   --   --   --   --   --   --  2.2  --   --   PHOS 6.4*  --  6.5*  --   --   --   --  8.3*  --   --   --    < > = values in this interval not displayed.   Liver Function Tests: Recent Labs  Lab 09/08/20 1649 09/11/20 0242 09/12/20 0231 09/12/20 2300 September 23, 2020 0353  AST  --   --   --  44*  --   ALT  --    --   --  5  --  ALKPHOS  --   --   --  39  --   BILITOT  --   --   --  0.6  --   PROT 3.5*  --   --  3.9*  --   ALBUMIN  --  1.8* 1.7* 1.7* 1.1*   No results for input(s): LIPASE, AMYLASE in the last 168 hours. Recent Labs  Lab 09/12/20 2300  AMMONIA 29   CBC: Recent Labs  Lab 09/09/20 0228 09/11/20 2045 09/12/20 0231 09/12/20 2111 09/12/20 2300 09/12/20 2316 09/21/20 0115 September 21, 2020 0759 09-21-2020 0829  WBC 10.2 7.0 6.2  --  1.8*  --   --   --  0.4*  NEUTROABS  --   --   --   --   --   --   --   --  0.1*  HGB 13.4 14.0 13.4   < > 11.7* 11.2* 10.5* 6.5* 7.5*  HCT 39.4 42.0 41.7   < > 37.7 33.0* 31.0* 19.0* 25.2*  MCV 91.6 93.1 94.8  --  100.3*  --   --   --  104.6*  PLT 188 194 221  --  162  --   --   --  72*   < > = values in this interval not displayed.   Cardiac Enzymes: No results for input(s): CKTOTAL, CKMB, CKMBINDEX, TROPONINI in the last 168 hours. Sepsis Labs: Recent Labs  Lab 09/11/20 2045 09/12/20 0231 09/12/20 2300 09/21/2020 0829  WBC 7.0 6.2 1.8* 0.4*    Procedures/Operations   6/2 ETT 6/1 tunneled dialysis catheter placement   5/28 Angiography of her aorta and LLE, redo femoral endarterectomy and profundoplasty 5/27 IM nail left hip    Julian Hy 21-Sep-2020, 7:33 PM

## 2020-10-11 NOTE — Progress Notes (Signed)
PT Cancellation Note  Patient Details Name: Andrea Shepherd MRN: [id number] DOB: 1935-04-01   Cancelled Treatment:    Reason Eval/Treat Not Completed: Medical issues which prohibited therapy (pt with intubation and PEA arrest)   Anson Peddie B Iliza Blankenbeckler Sep 27, 2020, 9:11 AM  Bayard Males, PT Acute Rehabilitation Services Pager: [phone] Office: [phone]

## 2020-10-11 NOTE — Progress Notes (Signed)
eLink Physician-Brief Progress Note Patient Name: Andrea Shepherd DOB: 03-25-1935 MRN: [id number]   Date of Service  10-02-20  HPI/Events of Note  Patient with severe hypotension and hypoglycemia, I called her husband multiple times to advise him of her dire condition and discuss code status but I was not able to reach him.  eICU Interventions  Sodium bicarbonate 2 amps iv bolus then infusion increased to 150 ml / hour, D 50 1 amp pushed for hypoglycemia and D 10 % water gt started at 75 ml / hour, overall prognosis is abysmal.        Kerry Kass Zira Helinski 10/02/20, 5:04 AM

## 2020-10-11 NOTE — Progress Notes (Signed)
[facility] Progress Note Patient Name: Andrea Shepherd DOB: [birth year] MRN: [id number]   Date of Service  2020-09-17  HPI/Events of Note  Patient became profoundly hypotensive and dropped her saturation, she started to become bradycardic with a barely palpable pulse so she was  treated with iv Epinephrine bolus  + infusion, Vasopressin was also started, breath sounds appeared asymetric to auscultation so a stat portable CXR was ordered to exclude pneumothorax, there's no evidence of pneumothorax on preliminary read of the CXR, PCCM ground crew was asked to come and evaluate the patient.  eICU Interventions          Roran Wegner U Freemon Binford Sep 17, 2020, 1:12 AM

## 2020-10-11 NOTE — Progress Notes (Signed)
OT Cancellation Note  Patient Details Name: Andrea Shepherd MRN: [id number] DOB: 1934/04/26   Cancelled Treatment:    Reason Eval/Treat Not Completed: Medical issues which prohibited therapy.  Continue efforts as appropriate.    Eesa Justiss D Ashdon Gillson 2020/09/30, 9:18 AM  30-Sep-2020  Denice Paradise, OTR/L  Acute Rehabilitation Services  Office:  [phone]

## 2020-10-11 NOTE — Progress Notes (Signed)
PCCM Interval Progress Note  Called to bedside for near arrest.  HR dropped to 40's, SBP 50's despite vaso and levo. Levo titrated to 60, vaso 0.03. Epi gtt added.  Initially over responded with HR 130's and SBP 200's.  Epi lowered.  CXR obtained, no PTX and no acute process. ABG showed metabolic and respiratory acidosis.  RR on vent increased from 26 to 32.  2amps HCO3 given.  I attempted right femoral arterial line.  Artery cannulated (walls very stiff); however, wire unable to be guided without kinking. Right radial site very poor flow and small diameter; therefore, did not attempt. Right brachial art line attempted.  Initially successfully placed after multiple attempts at guiding guidewire (met resistance each time) and had good readings and waveform. Unfortunately while taping it down, we lost readings.  Given the difficulty in placing, aborted re-attempt and opted to go with cuff pressures.  Attempted to call husband [name]; however, unsuccessful and no answer.  No additional family contacts in chart besides family friend.    Bicarb infusion added. Epi ceiling increased to 25. Levo ceiling increased to 70.  Unfortunately if she continues to decline despite above measures, there is little left to offer.  Discussed with [facility] MD.   CC time: 45 minutes.   Montey Hora, [facility] For pager details, please see AMION or use Epic chat  After 1900, please call [place] for cross coverage needs 11-Oct-2020, 1:56 AM

## 2020-10-11 NOTE — Progress Notes (Signed)
Patient ID: Andrea Shepherd, female   DOB: 1934/08/29, 86 y.o.   MRN: [id number] [facility] Progress Note   Assessment/ Plan:   1.  Acute kidney injury on chronic kidney disease stage IV: Anuric overnight with continued worsening of azotemia and seemingly plateaued creatinine.  Likely with ATN from hemodynamic perturbations as well as contrast nephropathy.  Anuric with critical hyperkalemia this morning for which I will start her on CRRT based on her hemodynamic instability. 2.  Hyperkalemia: Secondary to acute kidney injury.  Potassium level elevated after hemodialysis yesterday, started CRRT 3.  Anion gap metabolic acidosis: Secondary to acute kidney injury, currently on sodium bicarbonate.-We will discontinue this upon beginning CRRT. 4.  Left leg ischemia-acute on chronic status post redo left CFA endarterectomy/profundoplasty with bovine pericardial patch angioplasty and thrombectomy of the distal end left external iliac stent.  Has been assessed closely by vascular surgery with no additional endovascular options for management. 5.  Left hip fracture: Following accidental fall and status post open management with intramedullary implant.  Awaiting PT/OT. 6.  Anemia: Likely from acute blood loss following hip fracture and losses associated with surgical intervention for left leg ischemia.  Hemoglobin and hematocrit within acceptable range.  Subjective:   With worsening hypotension/bradycardia overnight culminating in respiratory failure, transferred to ICU where she was intubated and on pressors.   Objective:   BP (!) 139/49   Pulse (!) 101   Temp (!) 90.86 F (32.7 C) (Esophageal) Comment (Src): bair hugger placed on patient  Resp (!) 26   Ht _0  (1.727 m)   Wt 55.5 kg   LMP  (LMP Unknown)   SpO2 (!) 85%   BMI 18.60 kg/m   Intake/Output Summary (Last 24 hours) at 10/07/2020 4098 Last data filed at 10-07-2020 0730 Gross per 24 hour  Intake 916.96 ml  Output 66 ml  Net  850.96 ml   Weight change: -0.7 kg  Physical Exam: Gen: Intubated, unresponsive CVS: Pulse regular rhythm, normal rate, S1 and S2 normal Resp: Anteriorly clear to auscultation-on ventilator no distinct wheeze/rhonchi Abd: Soft, flat, nontender Ext: No lower extremity edema, left leg without significant demarcation/mottling  Imaging: DG Abd 1 View  Result Date: 09/12/2020 CLINICAL DATA:  Check gastric catheter placement EXAM: ABDOMEN - 1 VIEW COMPARISON:  None. FINDINGS: Gastric catheter is noted deep within the stomach. Scattered large and small bowel gas is noted. Vascular calcifications with prior stenting are noted. Right-sided pleural effusion is seen. Degenerative changes of the lumbar spine are seen. IMPRESSION: Gastric catheter within the stomach. Electronically Signed   By: Inez Catalina M.D.   On: 09/12/2020 18:50   IR Fluoro Guide CV Line Right  Result Date: 09/11/2020 INDICATION: End-stage renal disease. In need of durable intravenous access for initiation of hemodialysis. EXAM: TUNNELED CENTRAL VENOUS HEMODIALYSIS CATHETER PLACEMENT WITH ULTRASOUND AND FLUOROSCOPIC GUIDANCE MEDICATIONS: The patient is currently admitted to the hospital and receiving intravenous antibiotics. The antibiotic was given in an appropriate time interval prior to skin puncture. ANESTHESIA/SEDATION: None FLUOROSCOPY TIME:  1 minute (4 mGy) COMPLICATIONS: None immediate. PROCEDURE: Informed written consent was obtained from the patient after a discussion of the risks, benefits, and alternatives to treatment. Questions regarding the procedure were encouraged and answered. The right neck and chest were prepped with chlorhexidine in a sterile fashion, and a sterile drape was applied covering the operative field. Maximum barrier sterile technique with sterile gowns and gloves were used for the procedure. A timeout was performed prior to the initiation  of the procedure. After creating a small venotomy incision, a  micropuncture kit was utilized to access the internal jugular vein. Real-time ultrasound guidance was utilized for vascular access including the acquisition of a permanent ultrasound image documenting patency of the accessed vessel. The microwire was utilized to measure appropriate catheter length. A stiff Glidewire was advanced to the level of the IVC and the micropuncture sheath was exchanged for a peel-away sheath. A palindrome tunneled hemodialysis catheter measuring 19 cm from tip to cuff was tunneled in a retrograde fashion from the anterior chest wall to the venotomy incision. The catheter was then placed through the peel-away sheath with tips ultimately positioned within the distal aspect of the SVC. Attempted aspiration from the arterial lumen resulting in sluggish flow despite fluoroscopic reposition of the dialysis catheter. As such, the existing 19 cm tip to cuff dialysis catheter was cannulated with a stiff Glidewire which was advanced to the level of the IVC. Under fluoroscopic guidance, a new dialysis catheter was exchanged for a slightly larger now 23 cm tip to cuff dialysis catheter with tip now more ideally positioned within the superior aspect the right atrium. The catheter aspirates and flushes normally. The catheter was flushed with appropriate volume heparin dwells. The catheter exit site was secured with a 0-Prolene retention suture. The venotomy incision was closed with Dermabond and Steri-strips. Dressings were applied. The patient tolerated the procedure well without immediate post procedural complication. IMPRESSION: Successful placement of 23 cm tip to cuff tunneled hemodialysis catheter via the right internal jugular vein with tips terminating within the superior aspect of the right atrium. The catheter is ready for immediate use. Electronically Signed   By: Sandi Mariscal M.D.   On: 09/11/2020 14:59   IR US Guide Vasc Access Right  Result Date: 09/11/2020 INDICATION: End-stage renal  disease. In need of durable intravenous access for initiation of hemodialysis. EXAM: TUNNELED CENTRAL VENOUS HEMODIALYSIS CATHETER PLACEMENT WITH ULTRASOUND AND FLUOROSCOPIC GUIDANCE MEDICATIONS: The patient is currently admitted to the hospital and receiving intravenous antibiotics. The antibiotic was given in an appropriate time interval prior to skin puncture. ANESTHESIA/SEDATION: None FLUOROSCOPY TIME:  1 minute (4 mGy) COMPLICATIONS: None immediate. PROCEDURE: Informed written consent was obtained from the patient after a discussion of the risks, benefits, and alternatives to treatment. Questions regarding the procedure were encouraged and answered. The right neck and chest were prepped with chlorhexidine in a sterile fashion, and a sterile drape was applied covering the operative field. Maximum barrier sterile technique with sterile gowns and gloves were used for the procedure. A timeout was performed prior to the initiation of the procedure. After creating a small venotomy incision, a micropuncture kit was utilized to access the internal jugular vein. Real-time ultrasound guidance was utilized for vascular access including the acquisition of a permanent ultrasound image documenting patency of the accessed vessel. The microwire was utilized to measure appropriate catheter length. A stiff Glidewire was advanced to the level of the IVC and the micropuncture sheath was exchanged for a peel-away sheath. A palindrome tunneled hemodialysis catheter measuring 19 cm from tip to cuff was tunneled in a retrograde fashion from the anterior chest wall to the venotomy incision. The catheter was then placed through the peel-away sheath with tips ultimately positioned within the distal aspect of the SVC. Attempted aspiration from the arterial lumen resulting in sluggish flow despite fluoroscopic reposition of the dialysis catheter. As such, the existing 19 cm tip to cuff dialysis catheter was cannulated with a stiff  Glidewire  which was advanced to the level of the IVC. Under fluoroscopic guidance, a new dialysis catheter was exchanged for a slightly larger now 23 cm tip to cuff dialysis catheter with tip now more ideally positioned within the superior aspect the right atrium. The catheter aspirates and flushes normally. The catheter was flushed with appropriate volume heparin dwells. The catheter exit site was secured with a 0-Prolene retention suture. The venotomy incision was closed with Dermabond and Steri-strips. Dressings were applied. The patient tolerated the procedure well without immediate post procedural complication. IMPRESSION: Successful placement of 23 cm tip to cuff tunneled hemodialysis catheter via the right internal jugular vein with tips terminating within the superior aspect of the right atrium. The catheter is ready for immediate use. Electronically Signed   By: Sandi Mariscal M.D.   On: 09/11/2020 14:59   DG CHEST PORT 1 VIEW  Result Date: 10-12-20 CLINICAL DATA:  Hypoxia EXAM: PORTABLE CHEST 1 VIEW COMPARISON:  09/12/2020 at 7:41 p.m. FINDINGS: Two frontal views of the chest demonstrate stable endotracheal tube, enteric catheter, and bilateral internal jugular catheters. Continued bilateral pleural effusions and bibasilar consolidation, right greater than left. No evidence of pneumothorax on this supine evaluation. No acute bony abnormalities. IMPRESSION: 1. Stable bilateral pleural effusions and bibasilar consolidation. 2. Stable support devices. Electronically Signed   By: Randa Ngo M.D.   On: 2020-10-12 01:22   DG CHEST PORT 1 VIEW  Result Date: 09/12/2020 CLINICAL DATA:  Encounter for central line placement. EXAM: PORTABLE CHEST 1 VIEW COMPARISON:  Earlier today. FINDINGS: New left internal jugular central venous catheter tip projects over the upper SVC. No pneumothorax. Right-sided dialysis catheter, endotracheal tube, and enteric tube remain in place. Stable heart size and mediastinal  contours with unchanged cardiomegaly. Aortic atherosclerosis. Hazy lung base opacities consistent with pleural effusions and atelectasis/airspace disease, not significantly changed from earlier today. Right axillary surgical clips. IMPRESSION: 1. New left internal jugular central venous catheter with tip projecting over the upper SVC. No pneumothorax. 2. Remaining support apparatus is unchanged. 3. Unchanged bilateral pleural effusions and atelectasis/airspace disease. Electronically Signed   By: Keith Rake M.D.   On: 09/12/2020 20:02   Portable Chest x-ray  Result Date: 09/12/2020 CLINICAL DATA:  NG tube, ET tube placement EXAM: PORTABLE CHEST 1 VIEW COMPARISON:  09/12/2020 FINDINGS: Endotracheal tube is 5.7 cm above the carina. NG tube enters the stomach. Right dialysis catheter is unchanged. Hyperinflation of the lungs. Layering bilateral pleural effusions with bilateral lower lobe airspace disease. Mild cardiomegaly, aortic atherosclerosis. IMPRESSION: Endotracheal tube 5.7 cm above the carina.  NG tube in the stomach. Layering effusions and bilateral lower lobe airspace opacities. Cardiomegaly, hyperinflation. Electronically Signed   By: Rolm Baptise M.D.   On: 09/12/2020 18:45   DG Chest Port 1 View  Result Date: 09/12/2020 CLINICAL DATA:  Shortness of breath. EXAM: PORTABLE CHEST 1 VIEW COMPARISON:  09/11/2020. FINDINGS: Dual-lumen catheter noted stable position. Heart size normal. Prominent bilateral layering pleural effusions again noted. Persistent left lower lobe atelectasis/consolidation. Tiny calcified pulmonary nodules most consistent with prior granulomas disease. No pneumothorax. Surgical clips right chest and over left neck. Prominent skin folds noted bilaterally. Degenerative changes and scoliosis thoracolumbar spine. No acute bony abnormality. IMPRESSION: 1.  Dual-lumen catheter stable position. 2. Prominent bilateral layering pleural effusions again noted. Persistent left lower lobe  atelectasis/consolidation. Electronically Signed   By: Marcello Moores  Register   On: 09/12/2020 09:13   DG Chest Port 1 View  Result Date: 09/11/2020 CLINICAL DATA:  Acute  respiratory distress EXAM: PORTABLE CHEST 1 VIEW COMPARISON:  09/09/2020 FINDINGS: Cardiac shadow is within normal limits. Aortic calcifications are noted. There is been interval placement of a right jugular dialysis catheter in satisfactory position. Bilateral pleural effusions are noted. Left retrocardiac consolidation is again noted and stable. No pneumothorax is seen. IMPRESSION: No pneumothorax following dialysis catheter placement. Bilateral effusions and left retrocardiac consolidation stable from the prior study. Electronically Signed   By: Inez Catalina M.D.   On: 09/11/2020 19:37    Labs: BMET Recent Labs  Lab 09/10/20 0108 09/10/20 1771 09/10/20 1653 09/11/20 0242 09/11/20 2045 09/12/20 0231 09/12/20 2111 09/12/20 2300 09/12/20 2316 09/19/2020 0115 09-19-20 0353  NA 136  --  139 136 136 136 138 138 136 139 142  K 5.7*   < > 5.2* 5.2* 4.7 5.0 3.7 4.5 4.5 4.0 6.6*  CL 109  --  108 101 98 99  --  106  --   --  106  CO2 16*  --  19* 20* 21* 27  --  24  --   --  22  GLUCOSE 105*  --  114* 131* 105* 132*  --  89  --   --  38*  BUN 71*  --  70* 73* 74* 77*  --  44*  --   --  44*  CREATININE 3.70*  --  3.76* 3.74* 3.76* 3.85*  --  2.79*  --   --  2.85*  CALCIUM 8.1*  --  8.3* 8.2* 8.2* 8.4*  --  7.4*  --   --  7.3*  PHOS  --   --   --  6.4*  --  6.5*  --   --   --   --  8.3*   < > = values in this interval not displayed.   CBC Recent Labs  Lab 09/09/20 0228 09/11/20 2045 09/12/20 0231 09/12/20 2111 09/12/20 2300 09/12/20 2316 09-19-20 0115  WBC 10.2 7.0 6.2  --  1.8*  --   --   HGB 13.4 14.0 13.4 11.6* 11.7* 11.2* 10.5*  HCT 39.4 42.0 41.7 34.0* 37.7 33.0* 31.0*  MCV 91.6 93.1 94.8  --  100.3*  --   --   PLT 188 194 221  --  162  --   --     Medications:    . sodium chloride   Intravenous Once  . sodium  chloride   Intravenous Once  . atorvastatin  40 mg Oral q1800  . chlorhexidine gluconate (MEDLINE KIT)  15 mL Mouth Rinse BID  . Chlorhexidine Gluconate Cloth  6 each Topical Daily  . clopidogrel  75 mg Oral Daily  . docusate sodium  100 mg Oral BID  . feeding supplement  237 mL Oral TID BM  . fentaNYL (SUBLIMAZE) injection  25 mcg Intravenous Once  . gabapentin  100 mg Oral QHS  . heparin injection (subcutaneous)  5,000 Units Subcutaneous Q8H  . heparin lock flush  250 Units Intracatheter Daily  . insulin aspart  5 Units Intravenous Once  . mouth rinse  15 mL Mouth Rinse 10 times per day  . multivitamin with minerals  1 tablet Oral Daily  . nystatin  5 mL Oral QID  . pantoprazole (PROTONIX) IV  40 mg Intravenous Daily  . polyethylene glycol  17 g Oral Daily   Elmarie Shiley, MD 09-19-20, 8:14 AM

## 2020-10-11 NOTE — Code Documentation (Signed)
Code blue called for PEA arrest. CPR, epinephrine given per ACLS protocol. Additional bicarb and dextrose given. Severely bradycardic PEA on monitor at pulse checks, no palpable pulses. Resuscitation efforts terminated at  9:25 AM.   Attempting to call and notify husband and friend- no answer to phone calls. They had not left yet to travel to the hospital when they were last contacted.  Julian Hy, DO 2020/09/22 9:32 AM [facility]

## 2020-10-11 NOTE — Progress Notes (Signed)
[facility] Progress Note Patient Name: Andrea Shepherd DOB: 06/09/1934 MRN: [id number]   Date of Service  10/09/2020  HPI/Events of Note  K+ 6.6  eICU Interventions  Adult hyperkalemia treatment protocol orders entered.        Kerry Kass Nyanna Heideman 10/09/2020, 6:55 AM

## 2020-10-11 NOTE — Significant Event (Signed)
Contacted medical examiner to determine if patient is a potential case. Spoke with Arnoldo Morale, who stated that patient is an ME case.    Kathaleen Grinder, RN

## 2020-10-11 NOTE — Progress Notes (Addendum)
NAME:  Andrea Shepherd, MRN:  [id number], DOB:  01/13/1935, LOS: 8 ADMISSION DATE:  08/15/2020, CONSULTATION DATE:  09/12/2020 REFERRING MD:  Dr, Urbano Heir, CHIEF COMPLAINT:  Andrea Shepherd    History of Present Illness:  Andrea Shepherd is a 86 y.o. with a past medical history significant for hyperlipidemia, PAD, CKD stage IV, tobacco abuse, hypertension, and bilateral carotid stenosis who presented after suffering mechanical fall and was found to have left hip fracture. She is now statu post ORIF 5/27. Since orthopedic repair patient unfortunately suffered critical limb ischemia that required recannulization with thrombectomy and enterectomy completed 5/28.   Overnight of 6/1 patient became hypoxia with signs of uremia and volume overload therefore Nephrology asked IR to place tunneled HD cath. Patient had first HD session 6/2. On return from HD patient was seen minimally responsive, hypotensive, and hypoglycemic. Rapid response was called and PCCM consulted for further management.   Pertinent  Medical History  Hyperlipidemia, PAD, CKD stage IV, tobacco abuse, hypertension, and bilateral carotid stenosis who presented   Significant Hospital Events: Including procedures, antibiotic start and stop dates in addition to other pertinent events   . 5/27 admitted after mechanical fall resulting in left hip fracture s/p ORIF to left hip . 5/28  critical limb ischemia that required recannulization with thrombectomy and enterectomy completed 5/28.  . 5/29 right thoracentesis with 612ml removed  . 6/2 tunneled HD cath placed and first HD session complete, 1L. Post HD patient was seen minimally responsive, hypotensive, and hypoglycemic. Transfered to ICU for intubation   Interim History / Subjective:  Temp 90 on biar hugger  Levo, vaso, epi gtts + bicarbonate  I/O+615ml in last 24 hours  No growth on micro   Objective   Blood pressure (!) 139/49, pulse (!) 101, temperature (!) 90.86 F (32.7 C), temperature  source Esophageal, resp. rate (!) 32, height 5\' 8"  (1.727 m), weight 55.5 kg, SpO2 (!) 85 %.    Vent Mode: PRVC FiO2 (%):  [60 %-100 %] 100 % Set Rate:  [18 bmp-32 bmp] 32 bmp Vt Set:  [510 mL] 510 mL PEEP:  [5 cmH20-8 cmH20] 8 cmH20 Plateau Pressure:  [18 cmH20-27 cmH20] 27 cmH20   Intake/Output Summary (Last 24 hours) at 09-21-20 0859 Last data filed at 09/21/2020 0730 Gross per 24 hour  Intake 916.96 ml  Output 66 ml  Net 850.96 ml   Filed Weights   09/12/20 0700 09/12/20 1210 09/12/20 1449  Weight: 56.3 kg 55.6 kg 55.5 kg    Examination: General: critically ill appearing female on vent  HEENT: MM dry, ETT in place, anicteric  Neuro: obtunded on vent, no response to verbal stimuli CV: s1s2 brady with pauses, no m/r/g PULM: non-labored on vent, lungs with bilateral breath sounds / good air entry  GI: mild protuberance, bsx4 hypoactive  Extremities: cool, mottled extremities, left hip wound c/d/i   Labs/imaging that I havepersonally reviewed    Echocardiogram > EF of 60-65% with grade 1 diastolic dysfunction and small pericardial effusion.   CXR 6/3, ETT in good position  Resolved Hospital Problem list     Assessment & Plan:   Acute Hypoxic Respiratory Failure  -PRVC 8cc/kg, rate 32 for compensation of acidosis / no further room to increase  -wean PEEP / FiO2 for sats >90% -VAP prevention measures  -follow intermittent CXR   AKI on CKD IV Hyperkalemia  AGMA S/p tunneled HD cath placement per IR, First HD session 6/2 -appreciate Nephrology > plan for CRRT  -Trend BMP /  urinary output -Replace electrolytes as indicated -Avoid nephrotoxic agents, ensure adequate renal perfusion  Shock - suspect Cardiogenic Acute diastolic congestive heart failure  Volume overload in te setting of CHF and ARF Bilateral pleural effusion  Right thoracentesis with 656ml removed  -vasopressors for MAP >65 -continue bicarbonate gtt for profound acidemia  -volume status per HD /  Nephrology  -pleural fluid with no growth  -strict I/O -daily weights   Anemia  Thrombocytopenia  Suspect in setting of acute blood loss, CKD  -follow CBC  -transfuse for Hgb <7%  Hypoglycemia  -dextrose infusion   Left Leg ischemia  PAD Acute on chronic s/p redo of left CFA endarterectomy / profundoplasty with bovine pericardial patch angioplasty & thrombectomy of the distal end left external iliac stent -appreciate VVS, per note next options would be amputation   Left Hip Fracture  S/p fall PTA, open IM implant -PT / OT if recovers from acute illness   Best practice   Diet:  NPO Pain/Anxiety/Delirium protocol (if indicated): Yes (RASS goal -1) VAP protocol (if indicated): Yes DVT prophylaxis: Subcutaneous Heparin GI prophylaxis: PPI Glucose control:  SSI Yes Central venous access:  Yes, and it is still needed Arterial line:  N/A Foley:  N/A Mobility:  bed rest  PT consulted: N/A Last date of multidisciplinary goals of care discussion: pending family arrival 6/3 Code Status:  full code Disposition: ICU   Critical care time:    Noe Gens, MSN, APRN, NP-C, AGACNP-BC  [facility] 09-15-20, 8:59 AM   Please see [email or web address] for pager details.   From 7A-7P if no response, please call [phone] After hours, please call ELink [phone]

## 2020-10-11 NOTE — Progress Notes (Addendum)
  Progress Note    2020/09/15 8:09 AM 6 Days Post-Op  Subjective:  Yesterday and overnight events noted   Vitals:   2020-09-15 0736 2020/09/15 0738  BP: (!) 139/49   Pulse:  (!) 101  Resp: 13 (!) 26  Temp:  (!) 90.86 F (32.7 C)  SpO2:  (!) 85%   Physical Exam: Cardiac:  Lungs:  intubated Incisions:  Left femoral incision with Prevena vac to suction Extremities: 2 + femoral pulses, right lower extremity warm and well perfused, left foot cool and mottled. No doppler signals in left foot Neurologic: sedated  CBC    Component Value Date/Time   WBC 1.8 (L) 09/12/2020 2300   RBC 3.76 (L) 09/12/2020 2300   HGB 10.5 (L) 2020/09/15 0115   HGB 11.9 06/21/2020 1024   HCT 31.0 (L) 09-15-20 0115   HCT 32.9 (L) 06/21/2020 1024   PLT 162 09/12/2020 2300   PLT 311 06/21/2020 1024   MCV 100.3 (H) 09/12/2020 2300   MCV 97 06/21/2020 1024   MCH 31.1 09/12/2020 2300   MCHC 31.0 09/12/2020 2300   RDW 16.4 (H) 09/12/2020 2300   RDW 13.5 06/21/2020 1024   LYMPHSABS 1.0 08/14/2020 2205   LYMPHSABS 1.0 06/21/2020 1024   MONOABS 0.6 08/15/2020 2205   EOSABS 0.0 08/30/2020 2205   EOSABS 0.1 06/21/2020 1024   BASOSABS 0.0 08/27/2020 2205   BASOSABS 0.0 06/21/2020 1024    BMET    Component Value Date/Time   NA 142 09/15/20 0353   NA 142 06/21/2020 1024   K 6.6 (HH) Sep 15, 2020 0353   CL 106 2020/09/15 0353   CO2 22 09/15/20 0353   GLUCOSE 38 (LL) 09-15-2020 0353   BUN 44 (H) 15-Sep-2020 0353   BUN 39 (H) 06/21/2020 1024   CREATININE 2.85 (H) 09/15/20 0353   CALCIUM 7.3 (L) 15-Sep-2020 0353   GFRNONAA 16 (L) 2020-09-15 0353    INR    Component Value Date/Time   INR 0.9 09/11/2020 2054     Intake/Output Summary (Last 24 hours) at 09/15/20 0809 Last data filed at 09/15/20 0730 Gross per 24 hour  Intake 916.96 ml  Output 66 ml  Net 850.96 ml     Assessment/Plan:  86 y.o. female is s/p redo left common femoral endarterectomy with profundoplasty and bovine  pericardial patchangioplastyincluding thrombectomy of the distal left external iliac stent  6 Days Post-Op. Patient received HD yesterday and following became profoundly hypotensive, with bradycardia, and hypoglycemia. She was transferred to ICU. Now intubated and sedated. On pressors. Left lower extremity remains unchanged. Unable to find doppler signal this morning but expected with her current status. Overall poor prognosis. Vascular is available if needed  Karoline Caldwell, PA-C [facility] [phone] 09-15-20 8:09 AM   I have seen and evaluated the patient. I agree with the PA note as documented above.  She is critically ill this morning with multisystem organ failure.  She was transferred to the ICU and intubated after dialysis yesterday after becoming hypotensive and bradycardic.  I cannot find any Doppler signals in her left foot likely related to all of her pressor requirements.  Critical care is trying to contact family for goals of care which is appropriate.  Marty Heck, MD [facility] Office: [phone]

## 2020-10-11 NOTE — Progress Notes (Signed)
Husband updated when he arrived at the hospital that [place] had passed away. Condolences were given and support of the chaplain was offered. Rae Halsted present throughout discussion and will help patient with finding a funeral home.  Julian Hy, DO 09/15/2020 10:33 AM [facility]

## 2020-10-11 NOTE — Code Documentation (Signed)
Patient developed bradycardia and went into PEA. CPR started. Epi given just prior to code due to bradycardia and bicarb given. Additional epi given after code started and q3 minutes. ROSC at second pulse check with organized rhythm, HR in 60s. Husband updated en route to hospital.   Andrea Hy, DO 2020/09/17 9:01 AM [facility]

## 2020-10-11 DEATH — deceased

## 2020-12-31 NOTE — Telephone Encounter (Signed)
Opened in error

## 2023-03-08 IMAGING — DX DG CHEST 1V PORT
2 series · 2 of 2 positions shown · non-contrast
Comparison: 09/12/2020 at [DATE] p.m.

CLINICAL DATA: Hypoxia

EXAM:
PORTABLE CHEST 1 VIEW

[chest ap (1 of 2)]
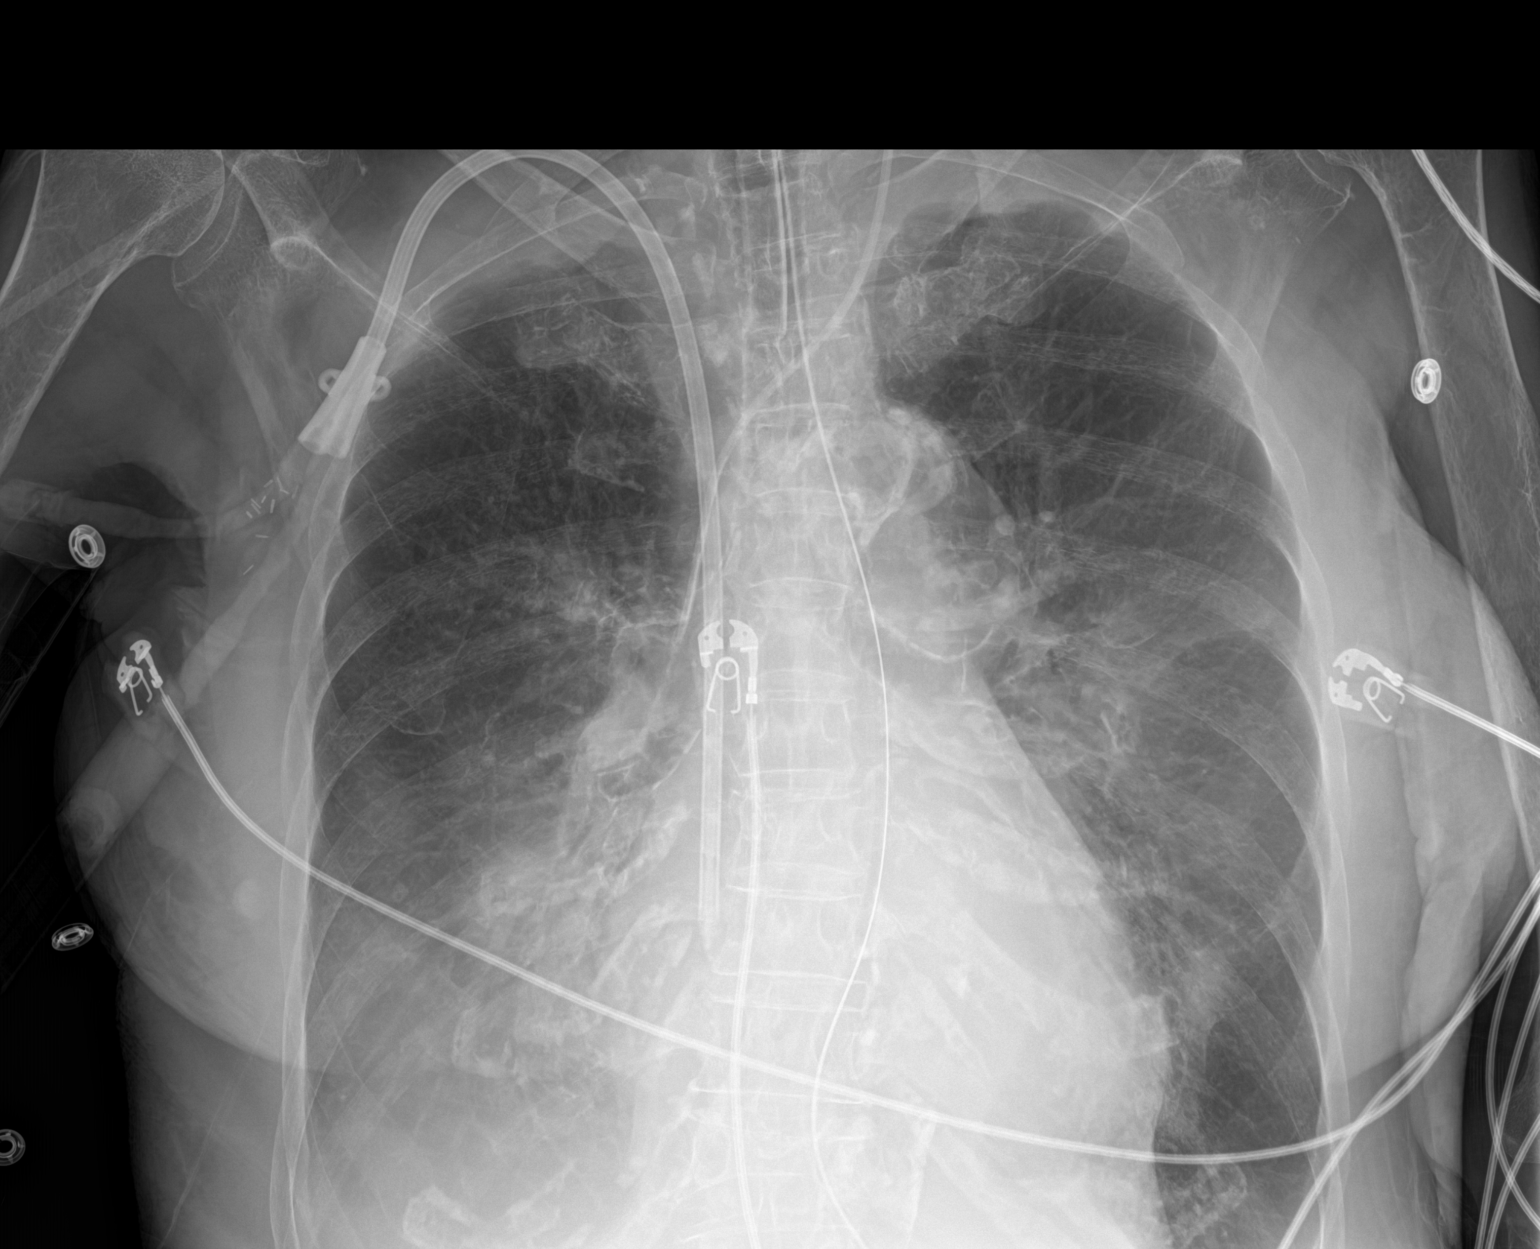

[chest ap (2 of 2)]
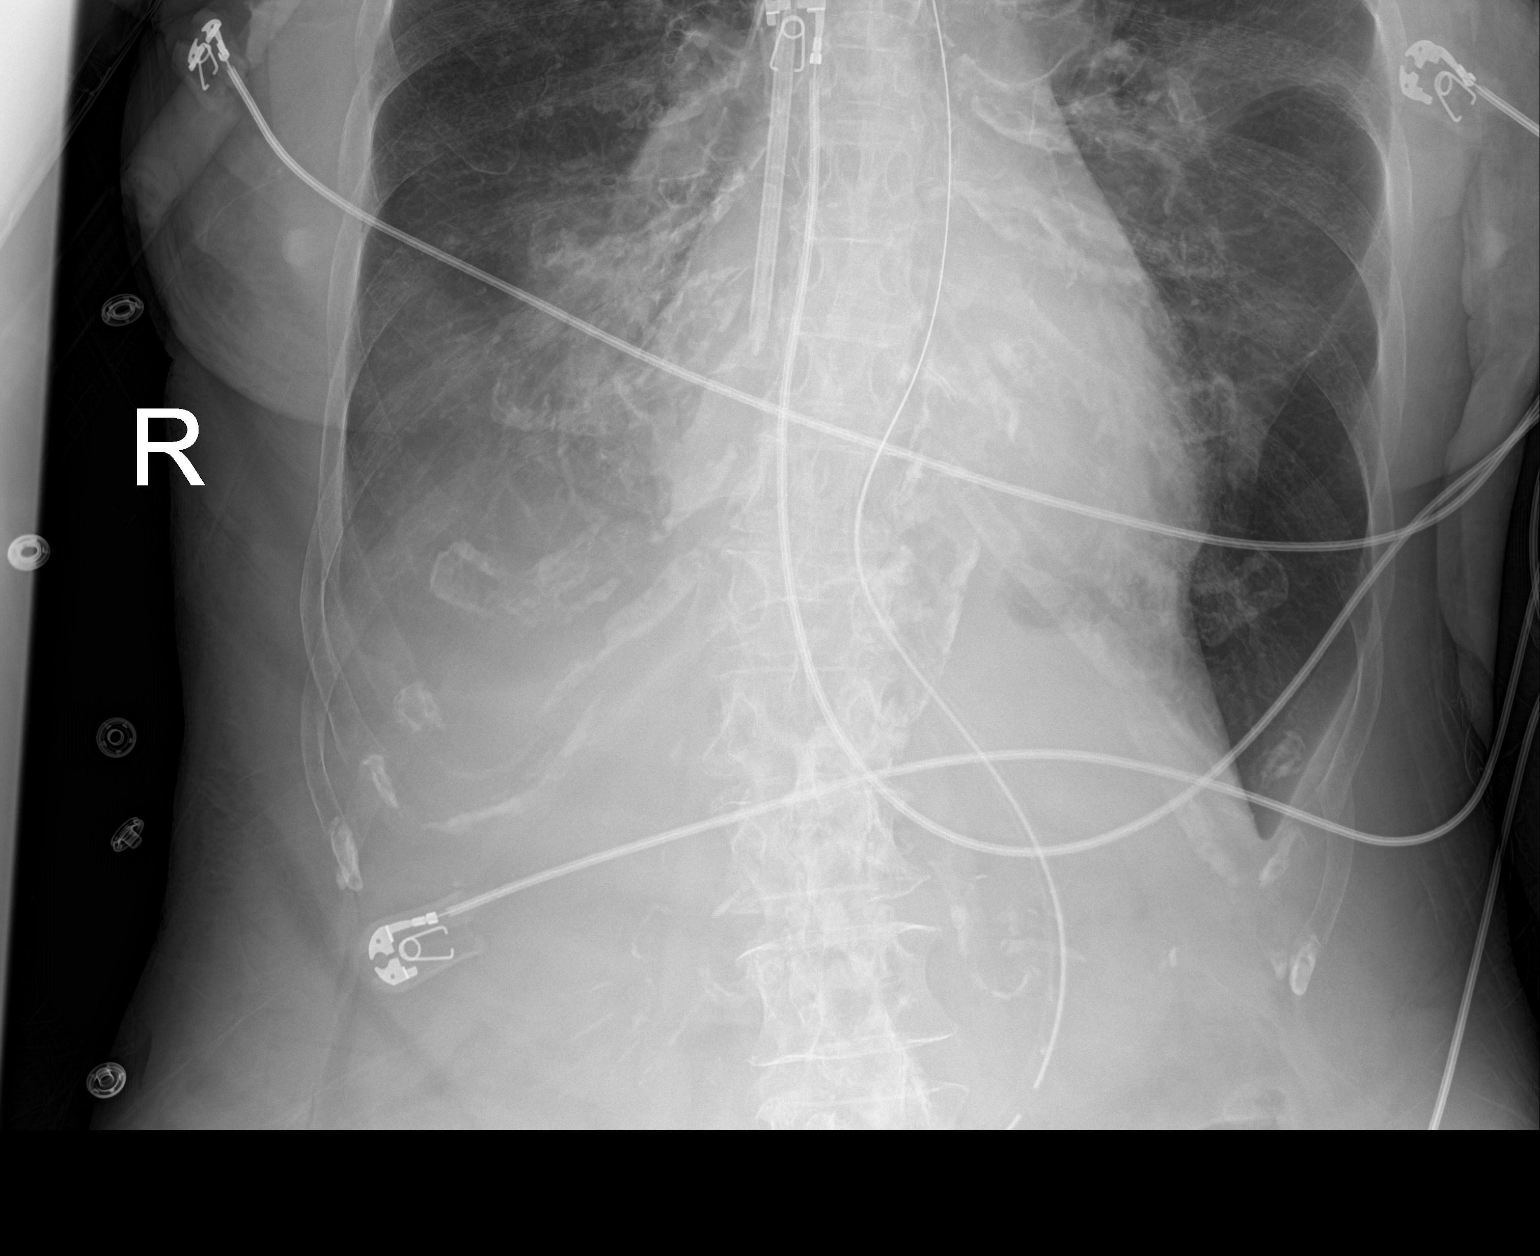

[2 of 2 positions shown; findings below may reference images not displayed]

FINDINGS: Two frontal views of the chest demonstrate stable endotracheal tube,
enteric catheter, and bilateral internal jugular catheters.
Continued bilateral pleural effusions and bibasilar consolidation,
right greater than left. No evidence of pneumothorax on this supine
evaluation. No acute bony abnormalities.
IMPRESSION: 1. Stable bilateral pleural effusions and bibasilar consolidation.
2. Stable support devices.
